# Patient Record
Sex: Female | Born: 1945
Health system: Southern US, Community
[De-identification: ages and names within clinical notes are randomized; demographics above are authoritative.]

## PROBLEM LIST (undated history)

## (undated) DIAGNOSIS — K31811 Angiodysplasia of stomach and duodenum with bleeding: Secondary | ICD-10-CM

## (undated) DIAGNOSIS — IMO0001 Reserved for inherently not codable concepts without codable children: Secondary | ICD-10-CM

## (undated) DIAGNOSIS — K635 Polyp of colon: Secondary | ICD-10-CM

## (undated) DIAGNOSIS — I10 Essential (primary) hypertension: Secondary | ICD-10-CM

## (undated) DIAGNOSIS — K8020 Calculus of gallbladder without cholecystitis without obstruction: Secondary | ICD-10-CM

## (undated) DIAGNOSIS — IMO0002 Reserved for concepts with insufficient information to code with codable children: Secondary | ICD-10-CM

## (undated) DIAGNOSIS — D649 Anemia, unspecified: Secondary | ICD-10-CM

## (undated) DIAGNOSIS — N200 Calculus of kidney: Secondary | ICD-10-CM

## (undated) DIAGNOSIS — K31819 Angiodysplasia of stomach and duodenum without bleeding: Secondary | ICD-10-CM

## (undated) DIAGNOSIS — A0472 Enterocolitis due to Clostridium difficile, not specified as recurrent: Secondary | ICD-10-CM

## (undated) DIAGNOSIS — C50412 Malignant neoplasm of upper-outer quadrant of left female breast: Secondary | ICD-10-CM

## (undated) DIAGNOSIS — R232 Flushing: Secondary | ICD-10-CM

## (undated) DIAGNOSIS — Z973 Presence of spectacles and contact lenses: Secondary | ICD-10-CM

## (undated) DIAGNOSIS — H269 Unspecified cataract: Secondary | ICD-10-CM

## (undated) HISTORY — DX: Unspecified cataract: H26.9

## (undated) HISTORY — DX: Flushing: R23.2

## (undated) HISTORY — DX: Polyp of colon: K63.5

## (undated) HISTORY — DX: Enterocolitis due to Clostridium difficile, not specified as recurrent: A04.72

## (undated) HISTORY — DX: Malignant neoplasm of upper-outer quadrant of left female breast: C50.412

## (undated) HISTORY — DX: Reserved for concepts with insufficient information to code with codable children: IMO0002

## (undated) HISTORY — DX: Essential (primary) hypertension: I10

## (undated) HISTORY — PX: CATARACT EXTRACTION, BILATERAL: SHX1313

## (undated) HISTORY — PX: WISDOM TOOTH EXTRACTION: SHX21

## (undated) HISTORY — DX: Angiodysplasia of stomach and duodenum without bleeding: K31.819

## (undated) HISTORY — DX: Angiodysplasia of stomach and duodenum with bleeding: K31.811

## (undated) HISTORY — DX: Calculus of gallbladder without cholecystitis without obstruction: K80.20

## (undated) HISTORY — PX: TONSILLECTOMY: SUR1361

## (undated) HISTORY — DX: Reserved for inherently not codable concepts without codable children: IMO0001

## (undated) HISTORY — DX: Anemia, unspecified: D64.9

## (undated) HISTORY — PX: TUBAL LIGATION: SHX77

## (undated) HISTORY — PX: CHOLECYSTECTOMY: SHX55

## (undated) HISTORY — PX: COLONOSCOPY: SHX174

---

## 1978-05-20 HISTORY — PX: PARTIAL HYSTERECTOMY: SHX80

## 1999-02-21 ENCOUNTER — Other Ambulatory Visit: Admission: RE | Admit: 1999-02-21 | Discharge: 1999-02-21 | Payer: Self-pay | Admitting: Obstetrics and Gynecology

## 2000-02-27 ENCOUNTER — Other Ambulatory Visit: Admission: RE | Admit: 2000-02-27 | Discharge: 2000-02-27 | Payer: Self-pay | Admitting: Obstetrics and Gynecology

## 2001-03-10 ENCOUNTER — Other Ambulatory Visit: Admission: RE | Admit: 2001-03-10 | Discharge: 2001-03-10 | Payer: Self-pay | Admitting: Obstetrics and Gynecology

## 2002-04-06 ENCOUNTER — Other Ambulatory Visit: Admission: RE | Admit: 2002-04-06 | Discharge: 2002-04-06 | Payer: Self-pay | Admitting: Obstetrics and Gynecology

## 2003-08-19 DIAGNOSIS — N200 Calculus of kidney: Secondary | ICD-10-CM | POA: Insufficient documentation

## 2003-08-21 ENCOUNTER — Emergency Department (HOSPITAL_COMMUNITY): Admission: EM | Admit: 2003-08-21 | Discharge: 2003-08-22 | Payer: Self-pay

## 2003-08-23 ENCOUNTER — Encounter: Admission: RE | Admit: 2003-08-23 | Discharge: 2003-08-23 | Payer: Self-pay | Admitting: Family Medicine

## 2003-08-23 DIAGNOSIS — K8020 Calculus of gallbladder without cholecystitis without obstruction: Secondary | ICD-10-CM

## 2003-08-23 HISTORY — DX: Calculus of gallbladder without cholecystitis without obstruction: K80.20

## 2003-10-19 DIAGNOSIS — K8020 Calculus of gallbladder without cholecystitis without obstruction: Secondary | ICD-10-CM | POA: Insufficient documentation

## 2003-11-11 ENCOUNTER — Encounter (INDEPENDENT_AMBULATORY_CARE_PROVIDER_SITE_OTHER): Payer: Self-pay | Admitting: Specialist

## 2003-11-11 ENCOUNTER — Observation Stay (HOSPITAL_COMMUNITY): Admission: RE | Admit: 2003-11-11 | Discharge: 2003-11-12 | Payer: Self-pay | Admitting: Surgery

## 2006-06-18 ENCOUNTER — Ambulatory Visit: Payer: Self-pay | Admitting: Family Medicine

## 2006-06-25 ENCOUNTER — Ambulatory Visit: Payer: Self-pay | Admitting: Family Medicine

## 2006-06-25 LAB — CONVERTED CEMR LAB
ALT: 13 units/L (ref 0–40)
Alkaline Phosphatase: 65 units/L (ref 39–117)
BUN: 14 mg/dL (ref 6–23)
Basophils Relative: 0.1 % (ref 0.0–1.0)
CO2: 33 meq/L — ABNORMAL HIGH (ref 19–32)
Calcium: 9.5 mg/dL (ref 8.4–10.5)
Creatinine, Ser: 0.8 mg/dL (ref 0.4–1.2)
Eosinophils Relative: 4 % (ref 0.0–5.0)
Hemoglobin: 11.8 g/dL — ABNORMAL LOW (ref 12.0–15.0)
Monocytes Absolute: 0.5 10*3/uL (ref 0.2–0.7)
Monocytes Relative: 13.1 % — ABNORMAL HIGH (ref 3.0–11.0)
Potassium: 4.1 meq/L (ref 3.5–5.1)
RDW: 12.8 % (ref 11.5–14.6)
Total Bilirubin: 0.9 mg/dL (ref 0.3–1.2)
Total Protein: 7.2 g/dL (ref 6.0–8.3)
WBC: 4.1 10*3/uL — ABNORMAL LOW (ref 4.5–10.5)

## 2006-07-16 ENCOUNTER — Ambulatory Visit: Payer: Self-pay | Admitting: Internal Medicine

## 2006-08-18 ENCOUNTER — Ambulatory Visit: Payer: Self-pay | Admitting: Family Medicine

## 2007-01-14 ENCOUNTER — Ambulatory Visit: Payer: Self-pay | Admitting: Family Medicine

## 2007-01-14 DIAGNOSIS — D649 Anemia, unspecified: Secondary | ICD-10-CM | POA: Insufficient documentation

## 2007-01-20 LAB — CONVERTED CEMR LAB
Eosinophils Absolute: 0.2 10*3/uL (ref 0.0–0.6)
Eosinophils Relative: 4 % (ref 0.0–5.0)
HCT: 34 % — ABNORMAL LOW (ref 36.0–46.0)
Lymphocytes Relative: 27.7 % (ref 12.0–46.0)
MCV: 88.8 fL (ref 78.0–100.0)
Monocytes Absolute: 0.5 10*3/uL (ref 0.2–0.7)
Neutro Abs: 2.5 10*3/uL (ref 1.4–7.7)
Neutrophils Relative %: 56.6 % (ref 43.0–77.0)
Platelets: 247 10*3/uL (ref 150–400)
WBC: 4.4 10*3/uL — ABNORMAL LOW (ref 4.5–10.5)

## 2007-11-03 ENCOUNTER — Ambulatory Visit: Payer: Self-pay | Admitting: Family Medicine

## 2007-12-06 ENCOUNTER — Encounter (INDEPENDENT_AMBULATORY_CARE_PROVIDER_SITE_OTHER): Payer: Self-pay | Admitting: Internal Medicine

## 2008-06-22 ENCOUNTER — Ambulatory Visit: Payer: Self-pay | Admitting: Family Medicine

## 2008-06-22 DIAGNOSIS — E559 Vitamin D deficiency, unspecified: Secondary | ICD-10-CM | POA: Insufficient documentation

## 2008-06-23 LAB — CONVERTED CEMR LAB
ALT: 14 units/L (ref 0–35)
Albumin: 3.9 g/dL (ref 3.5–5.2)
Alkaline Phosphatase: 61 units/L (ref 39–117)
BUN: 16 mg/dL (ref 6–23)
Basophils Relative: 0.1 % (ref 0.0–3.0)
Bilirubin, Direct: 0.1 mg/dL (ref 0.0–0.3)
CO2: 33 meq/L — ABNORMAL HIGH (ref 19–32)
Calcium: 9.6 mg/dL (ref 8.4–10.5)
Chloride: 107 meq/L (ref 96–112)
Eosinophils Relative: 5 % (ref 0.0–5.0)
GFR calc Af Amer: 109 mL/min
Glucose, Bld: 91 mg/dL (ref 70–99)
HDL: 39.2 mg/dL (ref >39.0)
MCV: 91 fL (ref 78.0–100.0)
Monocytes Relative: 11.4 % (ref 3.0–12.0)
Neutrophils Relative %: 57.1 % (ref 43.0–77.0)
Platelets: 226 10*3/uL (ref 150–400)
Potassium: 4.3 meq/L (ref 3.5–5.1)
RBC: 3.8 M/uL — ABNORMAL LOW (ref 3.87–5.11)
Total CHOL/HDL Ratio: 5.1
Total Protein: 7.4 g/dL (ref 6.0–8.3)
VLDL: 17 mg/dL (ref 0–40)
WBC: 5 10*3/uL (ref 4.5–10.5)

## 2008-07-11 ENCOUNTER — Ambulatory Visit: Payer: Self-pay | Admitting: Family Medicine

## 2008-07-11 LAB — CONVERTED CEMR LAB: OCCULT 2: NEGATIVE

## 2008-07-22 ENCOUNTER — Encounter (INDEPENDENT_AMBULATORY_CARE_PROVIDER_SITE_OTHER): Payer: Self-pay | Admitting: *Deleted

## 2009-12-21 ENCOUNTER — Encounter (INDEPENDENT_AMBULATORY_CARE_PROVIDER_SITE_OTHER): Payer: Self-pay | Admitting: *Deleted

## 2010-06-10 ENCOUNTER — Encounter: Payer: Self-pay | Admitting: Urology

## 2010-06-19 NOTE — Letter (Signed)
Summary: Kristi Carr letter  [place] [facility]  [address] [place], [place] [address]   Phone: [phone]  Fax: [phone]       12/21/2009 MRN: [id number]  Kristi Carr [address] [place], [place]  [address]  Dear Ms. [name] [place], and  announce the retirement of Arta Silence, M.D., from full-time practice at the [facility] office effective November 16, 2009 and his plans of returning part-time.  It is important to Dr. Hetty Ely and to our practice that you understand that [facility] has seven physicians in our office for your health care needs.  We will continue to offer the same exceptional care that you have today.    Dr. Hetty Ely has spoken to many of you about his plans for retirement and returning part-time in the fall.   We will continue to work with you through the transition to schedule appointments for you in the office and meet the high standards that [facility] is committed to.   Again, it is with great pleasure that we share the news that Dr. Hetty Ely will return to [facility] in October of 2011 with a reduced schedule.    If you have any questions, or would like to request an appointment with one of our physicians, please call us at [phone] and press the option for Scheduling an appointment.  We take pleasure in providing you with excellent patient care and look forward to seeing you at your next office visit.  Our [facility] Physicians are:  Tillman Abide, M.D. Laurita Quint, M.D. Roxy Manns, M.D. Kerby Nora, M.D. Hannah Beat, M.D. Ruthe Mannan, M.D. We proudly welcomed Raechel Ache, M.D. and Eustaquio Boyden, M.D. to the practice in July/August 2011.  Sincerely,  [facility]

## 2010-10-05 NOTE — Op Note (Signed)
[name], [name]                            ACCOUNT NO.:  [id number]   MEDICAL RECORD NO.:  [id number]                   PATIENT TYPE:  OBV   LOCATION:  0484                                 FACILITY:  [facility]   PHYSICIAN:  Thornton Park. Daphine Deutscher, M.D.             DATE OF BIRTH:  1946-03-06   DATE OF PROCEDURE:  11/11/2003  DATE OF DISCHARGE:                                 OPERATIVE REPORT   PREOPERATIVE DIAGNOSES:  Gallstones and history of gallbladder attacks.   POSTOPERATIVE DIAGNOSES:  Chronic cholecystitis.   SURGEON:  Thornton Park. Daphine Deutscher, M.D.   ASSISTANT:  Leonie Man, M.D.   PROCEDURE:  Laparoscopic cholecystectomy with intraoperative cholangiogram.   DESCRIPTION OF PROCEDURE:  [name] is a 65 year old white female who  had some attacks beginning earlier this year. She was seen in the office and  informed consent was obtained regarding laparoscopic as well as open  cholecystectomy.  She was taken to room 11 on November 11, 2003 and given  general anesthesia.  The abdomen was prepped with Betadine and draped  sterilely.  I excised her old transverse scar and umbilicus and entered the  abdomen through a transverse incision around which I placed a pursestring  suture. I entered the abdomen with a trocar easily and insufflated the  abdomen. Three trocars were placed in the upper abdomen using the new  applied medical devices including a 10 mm in the upper midline and two 5 mm  laterally.  The gallbladder was grasped, elevated, and I dissected Calot's  triangle to achieve the critical view of the cystic duct and cystic artery  and the location of the common bile duct.  I put a clip up on the  gallbladder and I also put some clips on the artery and a branch and then I  incised the cystic duct and then put in the Reddick catheter and did a  dynamic cholangiogram which showed good filling of a fairly plump proper  hepatic duct, nice filling of the common bile duct and with  intrahepatic  filling of both right and left branches with free flow into the duodenum.  The cystic duct was then triply clipped, divided, cystic artery was triple  clipped and controlled and then the gallbladder was removed from the  gallbladder bed without entering it using hook electrocautery.  Bleeding was  controlled and hemostasis achieved during the dissection.  No bile leaks  were noted and then the gallbladder was detached. There was no bleeding or  bile leaks noted from the gallbladder bed. The gallbladder was easily  retrieved through the umbilicus.  The umbilical port was then tied down and  this was then checked with the scope in place at which time I looked at both  the gallbladder bed and also back at the anterior abdominal wall.  No  bleeding was noted, no bile leaks were seen.  Trocar sites  were injected with 0.5% Marcaine and the abdomen was deflated  and the wounds were closed with 4-0 Vicryl subcutaneously with Benzoin and  Steri-Strips.  The patient seemed to tolerate the procedure well and was  taken to the recovery room in satisfactory condition.                                               Thornton Park Daphine Deutscher, M.D.    MBM/MEDQ  D:  11/11/2003  T:  11/11/2003  Job:  [id number]   cc:   Laurita Quint, M.D.  [address] [place]  [place] [address]  Fax: [phone]

## 2010-12-04 ENCOUNTER — Encounter: Payer: Self-pay | Admitting: Family Medicine

## 2010-12-05 ENCOUNTER — Encounter: Payer: Self-pay | Admitting: Family Medicine

## 2010-12-05 ENCOUNTER — Ambulatory Visit (INDEPENDENT_AMBULATORY_CARE_PROVIDER_SITE_OTHER): Payer: BC Managed Care – PPO | Admitting: Family Medicine

## 2010-12-05 DIAGNOSIS — H6120 Impacted cerumen, unspecified ear: Secondary | ICD-10-CM

## 2010-12-05 DIAGNOSIS — Z23 Encounter for immunization: Secondary | ICD-10-CM

## 2010-12-05 DIAGNOSIS — R030 Elevated blood-pressure reading, without diagnosis of hypertension: Secondary | ICD-10-CM

## 2010-12-05 DIAGNOSIS — IMO0001 Reserved for inherently not codable concepts without codable children: Secondary | ICD-10-CM

## 2010-12-05 NOTE — Patient Instructions (Addendum)
RTC 2 weeks for BP check. Have BP taken at least three times at [employer] with her machine at the same time. Bring machine here for recheck. Avoid salt as much as possible. Tdap today.

## 2010-12-05 NOTE — Progress Notes (Signed)
  Subjective:    Patient ID: Kristi Carr, female    DOB: July 07, 1945, 65 y.o.   MRN: [id number]  HPI Pt of Billie as well as pt of mine who hasn't been here for a while who comes in today for ear problems. She can't hear from them altho they have inmproved to where she can hear now. No pain, no fever or chills or congestion.  There is significant stress in the family currently. She feels well otherwise. She has a BP machine at home and routinely gets her BP in the 120s/70s.  She has seen a Gyn in [place] this year for Pap, which was nml. She had a mammo this year.   Review of Systems  Constitutional: Negative for fever, chills, diaphoresis, activity change and fatigue.  HENT: Negative for ear pain, congestion, rhinorrhea and postnasal drip.   Eyes: Negative for redness.  Respiratory: Negative for cough, chest tightness, shortness of breath and wheezing.   Cardiovascular: Negative for chest pain.       Objective:   Physical Exam  Constitutional: She appears well-developed and well-nourished. No distress.  HENT:  Head: Normocephalic and atraumatic.  Right Ear: External ear normal.  Left Ear: External ear normal.  Nose: Nose normal.  Mouth/Throat: Oropharynx is clear and moist. No oropharyngeal exudate.       Cerumen bilaterally occluding the canals. Nml beyond after irrigation.  Eyes: Conjunctivae and EOM are normal. Pupils are equal, round, and reactive to light.  Neck: Normal range of motion. Neck supple. No thyromegaly present.  Cardiovascular: Normal rate, regular rhythm and normal heart sounds.   Pulmonary/Chest: Effort normal and breath sounds normal. She has no wheezes. She has no rales.  Lymphadenopathy:    She has no cervical adenopathy.  Skin: She is not diaphoretic.          Assessment & Plan:

## 2010-12-05 NOTE — Assessment & Plan Note (Signed)
See instructions

## 2010-12-05 NOTE — Assessment & Plan Note (Signed)
Bilaterally, irrigated clear.

## 2010-12-19 ENCOUNTER — Ambulatory Visit (INDEPENDENT_AMBULATORY_CARE_PROVIDER_SITE_OTHER): Payer: BC Managed Care – PPO | Admitting: Family Medicine

## 2010-12-19 ENCOUNTER — Encounter: Payer: Self-pay | Admitting: Family Medicine

## 2010-12-19 DIAGNOSIS — I10 Essential (primary) hypertension: Secondary | ICD-10-CM

## 2010-12-19 MED ORDER — METOPROLOL SUCCINATE ER 25 MG PO TB24: 25.0000 mg | ORAL_TABLET | Freq: Every day | ORAL | Status: DC

## 2010-12-19 NOTE — Assessment & Plan Note (Signed)
Will start pt on Metoprolol at 25 mg and reassess in 2 mos. Take medication at night.

## 2010-12-19 NOTE — Patient Instructions (Signed)
RTC 2 mos for BP check.

## 2010-12-19 NOTE — Progress Notes (Signed)
  Subjective:    Patient ID: Kristi Carr, female    DOB: [birth year], 65 y.o.   MRN: [id number]  HPI Pt here for recheck of her BP. She monitors at home and it had been good as of last visit two weeks ago. Her father died at 28 of MI, never saw a doctor. Mother died of cancer. Brother has very high BP. He now has heart problems.  She feels well and had no complaints. She is very active and has lots going on right now.    Review of SystemsNoncontributory except as above.       Objective:   Physical Exam  Cardiovascular: Normal rate, regular rhythm, normal heart sounds and intact distal pulses.   No murmur heard. Pulmonary/Chest: Effort normal and breath sounds normal. She has no wheezes. She has no rales.          Assessment & Plan:

## 2011-02-20 ENCOUNTER — Other Ambulatory Visit: Payer: Self-pay | Admitting: Family Medicine

## 2011-02-20 DIAGNOSIS — D649 Anemia, unspecified: Secondary | ICD-10-CM

## 2011-02-20 DIAGNOSIS — E7800 Pure hypercholesterolemia, unspecified: Secondary | ICD-10-CM

## 2011-02-20 DIAGNOSIS — I10 Essential (primary) hypertension: Secondary | ICD-10-CM

## 2011-02-20 DIAGNOSIS — E559 Vitamin D deficiency, unspecified: Secondary | ICD-10-CM

## 2011-02-27 ENCOUNTER — Ambulatory Visit (INDEPENDENT_AMBULATORY_CARE_PROVIDER_SITE_OTHER): Payer: BC Managed Care – PPO | Admitting: Family Medicine

## 2011-02-27 ENCOUNTER — Encounter: Payer: Self-pay | Admitting: Family Medicine

## 2011-02-27 ENCOUNTER — Other Ambulatory Visit (INDEPENDENT_AMBULATORY_CARE_PROVIDER_SITE_OTHER): Payer: BC Managed Care – PPO

## 2011-02-27 VITALS — BP 150/82 | HR 64 | Temp 97.5°F | Ht 65.0 in | Wt 137.5 lb

## 2011-02-27 DIAGNOSIS — E7800 Pure hypercholesterolemia, unspecified: Secondary | ICD-10-CM

## 2011-02-27 DIAGNOSIS — E559 Vitamin D deficiency, unspecified: Secondary | ICD-10-CM

## 2011-02-27 DIAGNOSIS — I10 Essential (primary) hypertension: Secondary | ICD-10-CM

## 2011-02-27 DIAGNOSIS — D649 Anemia, unspecified: Secondary | ICD-10-CM

## 2011-02-27 LAB — BASIC METABOLIC PANEL
Calcium: 9.4 mg/dL (ref 8.4–10.5)
GFR: 81.33 mL/min (ref >60.00)
Glucose, Bld: 89 mg/dL (ref 70–99)
Sodium: 143 mEq/L (ref 135–145)

## 2011-02-27 LAB — HEPATIC FUNCTION PANEL
ALT: 13 U/L (ref 0–35)
Total Bilirubin: 0.6 mg/dL (ref 0.3–1.2)

## 2011-02-27 LAB — CBC WITH DIFFERENTIAL/PLATELET
Basophils Relative: 0.2 % (ref 0.0–3.0)
Eosinophils Relative: 3.4 % (ref 0.0–5.0)
HCT: 36.3 % (ref 36.0–46.0)
MCV: 91 fl (ref 78.0–100.0)
Monocytes Absolute: 0.5 10*3/uL (ref 0.1–1.0)
Monocytes Relative: 10 % (ref 3.0–12.0)
Neutrophils Relative %: 58.1 % (ref 43.0–77.0)
Platelets: 218 10*3/uL (ref 150.0–400.0)
RBC: 3.99 Mil/uL (ref 3.87–5.11)

## 2011-02-27 LAB — LIPID PANEL: VLDL: 18.4 mg/dL (ref 0.0–40.0)

## 2011-02-27 NOTE — Progress Notes (Signed)
  Subjective:    Patient ID: Kristi Carr, female    DOB: [birth year], 65 y.o.   MRN: [id number]  HPIPt here for 2 month follow up after not being seen for quite some time. Her BP was quite high and we started her on Metoprolol 25mg  at night. She is tolerating her medication ok. Her BP was great at home after starting the medication so she quit taking her BP. She is surprised to find it high today. She tried taking Co Q 10 but couldn't tolerate it due to heart feeling funny. She had stopped it and then restarted with the same results. She has a PE scheduled here in two weeks. She has a Gyn and is UTD with Pap, breast exam, mammo, colonoscopy and DEXA.  She feels well today with no complaints. She understands the importance of BP control as her brother has had a stroke and is having a rough time with debility.    Review of Systems  Constitutional: Negative for fever, chills, diaphoresis, activity change and fatigue.  HENT: Negative for ear pain, congestion, rhinorrhea and postnasal drip.   Eyes: Negative for redness.  Respiratory: Negative for cough, chest tightness, shortness of breath and wheezing.   Cardiovascular: Negative for chest pain.       Objective:   Physical Exam  Constitutional: She appears well-developed and well-nourished. No distress.  HENT:  Head: Normocephalic and atraumatic.  Right Ear: External ear normal.  Left Ear: External ear normal.  Nose: Nose normal.  Mouth/Throat: Oropharynx is clear and moist. No oropharyngeal exudate.  Eyes: Conjunctivae and EOM are normal. Pupils are equal, round, and reactive to light.  Neck: Normal range of motion. Neck supple. No thyromegaly present.  Cardiovascular: Normal rate, regular rhythm and normal heart sounds.   Pulmonary/Chest: Effort normal and breath sounds normal. She has no wheezes. She has no rales.  Lymphadenopathy:    She has no cervical adenopathy.  Skin: She is not diaphoretic.          Assessment & Plan:

## 2011-02-27 NOTE — Patient Instructions (Signed)
RTC as scheduled in 2 weeks. Had labs drawn this AM.

## 2011-02-27 NOTE — Assessment & Plan Note (Signed)
Improved but still elevasted. Increase to 50mg  by taking 2-25mg  tabs at night. Recheck at PE in 2 weeks.  BP Readings from Last 3 Encounters:  02/27/11 150/82  12/19/10 180/94  12/05/10 150/100

## 2011-03-14 ENCOUNTER — Encounter: Payer: Self-pay | Admitting: Family Medicine

## 2011-03-14 ENCOUNTER — Ambulatory Visit (INDEPENDENT_AMBULATORY_CARE_PROVIDER_SITE_OTHER): Payer: BC Managed Care – PPO | Admitting: Family Medicine

## 2011-03-14 DIAGNOSIS — H6120 Impacted cerumen, unspecified ear: Secondary | ICD-10-CM

## 2011-03-14 DIAGNOSIS — I10 Essential (primary) hypertension: Secondary | ICD-10-CM

## 2011-03-14 DIAGNOSIS — N200 Calculus of kidney: Secondary | ICD-10-CM

## 2011-03-14 DIAGNOSIS — D649 Anemia, unspecified: Secondary | ICD-10-CM

## 2011-03-14 DIAGNOSIS — E559 Vitamin D deficiency, unspecified: Secondary | ICD-10-CM

## 2011-03-14 DIAGNOSIS — E7800 Pure hypercholesterolemia, unspecified: Secondary | ICD-10-CM

## 2011-03-14 NOTE — Assessment & Plan Note (Signed)
None since having her gallstone.

## 2011-03-14 NOTE — Assessment & Plan Note (Signed)
Adequate without medication. Due to FH, would try to get LDL lower. Lab Results  Component Value Date   CHOL 191 02/27/2011   HDL 48.00 02/27/2011   LDLCALC 125* 02/27/2011   TRIG 92.0 02/27/2011   CHOLHDL 4 02/27/2011

## 2011-03-14 NOTE — Progress Notes (Signed)
  Subjective:    Patient ID: Kristi Carr, female    DOB: 16-Feb-1946, 65 y.o.   MRN: [id number]  HPI Pt here for Comp Exam. She was started on BP meds last time with increase to 25mg x2 of Metoprolol for BP 180/94. At home her BP has been 120/60-70 and she takes it regularly.  She has no other complaints.   Review of Systems  Constitutional: Negative for fever, chills, diaphoresis, activity change, appetite change, fatigue and unexpected weight change.  HENT: Negative for hearing loss, ear pain, nosebleeds, rhinorrhea, trouble swallowing, tinnitus and ear discharge.   Eyes: Negative for pain, discharge, redness and visual disturbance.  Respiratory: Negative for cough, chest tightness, shortness of breath and wheezing.   Cardiovascular: Negative for chest pain, palpitations and leg swelling.  Gastrointestinal: Negative for nausea, vomiting, abdominal pain, diarrhea, constipation, blood in stool and abdominal distention.  Genitourinary: Negative for dysuria and frequency.       Nocturia once a night.  Musculoskeletal: Negative for myalgias, back pain and arthralgias.  Skin: Negative for rash.  Neurological: Negative for dizziness, tremors, syncope and numbness.  Hematological: Negative for adenopathy. Does not bruise/bleed easily.  Psychiatric/Behavioral: Negative for hallucinations and agitation. The patient is not nervous/anxious.        Objective:   Physical Exam  Constitutional: She is oriented to person, place, and time. She appears well-developed and well-nourished. No distress.  HENT:  Head: Normocephalic and atraumatic.  Left Ear: External ear normal.  Nose: Nose normal.  Mouth/Throat: Oropharynx is clear and moist. No oropharyngeal exudate.  Eyes: Conjunctivae and EOM are normal. Pupils are equal, round, and reactive to light. No scleral icterus.  Neck: Normal range of motion. Neck supple. No thyromegaly present.  Cardiovascular: Normal rate, regular rhythm and normal heart  sounds.  Exam reveals no friction rub.   No murmur heard. Pulmonary/Chest: Effort normal and breath sounds normal. No respiratory distress. She has no wheezes. She has no rales.  Abdominal: Soft. Bowel sounds are normal. She exhibits no mass. There is no tenderness.  Musculoskeletal: Normal range of motion. She exhibits no edema and no tenderness.  Lymphadenopathy:    She has no cervical adenopathy.  Neurological: She is alert and oriented to person, place, and time. She has normal reflexes.  Skin: Skin is warm and dry. No rash noted. She is not diaphoretic. No erythema.  Psychiatric: She has a normal mood and affect. Her behavior is normal. Judgment and thought content normal.          Assessment & Plan:  HMPE

## 2011-03-14 NOTE — Assessment & Plan Note (Signed)
Clear today. Discussed regular irrigation.

## 2011-03-14 NOTE — Assessment & Plan Note (Signed)
Improved on increased Metoprolol but still need better coverage. She does not really want to increase medication or start an additional med. Her BP at home is real good.  Will have her decrease to taking Metoprolol 25mg  twice a day. Recheck in 4 months.

## 2011-03-14 NOTE — Assessment & Plan Note (Signed)
Adequate replacement.

## 2011-03-14 NOTE — Patient Instructions (Signed)
RTC 4/13 for BP check with Dr Para March.

## 2011-03-14 NOTE — Assessment & Plan Note (Signed)
Great level on current replacement.

## 2011-04-03 ENCOUNTER — Other Ambulatory Visit: Payer: Self-pay | Admitting: *Deleted

## 2011-04-03 MED ORDER — METOPROLOL SUCCINATE ER 25 MG PO TB24: ORAL_TABLET | ORAL | Status: DC

## 2011-04-03 NOTE — Telephone Encounter (Signed)
Left message on machine for patient to call back.

## 2011-04-03 NOTE — Telephone Encounter (Signed)
Pt states that her metoprolol dose was increased to twice a day at her last visit.  Is this the extended release?  She needs new script sent to [address].

## 2011-04-04 NOTE — Telephone Encounter (Signed)
Left message on machine to call back  

## 2011-04-15 NOTE — Telephone Encounter (Signed)
Spoke to patient and was advised that this has been taken care of and that she has already picked her medication up.

## 2011-09-13 ENCOUNTER — Ambulatory Visit (INDEPENDENT_AMBULATORY_CARE_PROVIDER_SITE_OTHER): Payer: BC Managed Care – PPO | Admitting: Family Medicine

## 2011-09-13 ENCOUNTER — Encounter: Payer: Self-pay | Admitting: Family Medicine

## 2011-09-13 VITALS — BP 150/86 | HR 63 | Temp 98.3°F | Wt 142.0 lb

## 2011-09-13 DIAGNOSIS — I10 Essential (primary) hypertension: Secondary | ICD-10-CM

## 2011-09-13 NOTE — Assessment & Plan Note (Signed)
Improved, controlled at home.  Continue as is.  Continue work on diet and exercise.  She agrees.  Fu for CPE fall 2013.

## 2011-09-13 NOTE — Progress Notes (Signed)
Hypertension:    Using medication without problems or lightheadedness: yes Chest pain with exertion: no Edema:no Short of breath:no Average home BPs: BP has been 120-130/70-80 consistently on home checks.   Exercise: gardening, yard work, grandkids.   Meds, vitals, and allergies reviewed.   PMH and SH reviewed  ROS: See HPI.  Otherwise negative.    GEN: nad, alert and oriented HEENT: mucous membranes moist NECK: supple w/o LA CV: rrr. PULM: ctab, no inc wob ABD: soft, +bs EXT: no edema SKIN: no acute rash

## 2011-09-13 NOTE — Patient Instructions (Signed)
Take care.  Don't change your meds.  Glad to see you.  I would get a physical in October or November.

## 2012-04-18 ENCOUNTER — Other Ambulatory Visit: Payer: Self-pay | Admitting: Family Medicine

## 2012-08-31 ENCOUNTER — Other Ambulatory Visit: Payer: Self-pay | Admitting: Family Medicine

## 2012-08-31 NOTE — Telephone Encounter (Signed)
Schedule a CPE.  rx sent.  Thanks.

## 2012-08-31 NOTE — Telephone Encounter (Signed)
Electronic refill request.  Pt has not been seen in > 1 year, no upcoming appts.  Please advise.

## 2012-09-01 NOTE — Telephone Encounter (Signed)
Patient advised.  Will call in to schedule CPE.

## 2012-10-20 ENCOUNTER — Other Ambulatory Visit: Payer: Self-pay | Admitting: Family Medicine

## 2012-10-20 DIAGNOSIS — I10 Essential (primary) hypertension: Secondary | ICD-10-CM

## 2012-10-20 DIAGNOSIS — E559 Vitamin D deficiency, unspecified: Secondary | ICD-10-CM

## 2012-10-20 DIAGNOSIS — D649 Anemia, unspecified: Secondary | ICD-10-CM

## 2012-10-27 ENCOUNTER — Other Ambulatory Visit (INDEPENDENT_AMBULATORY_CARE_PROVIDER_SITE_OTHER): Payer: Medicare Other

## 2012-10-27 DIAGNOSIS — D649 Anemia, unspecified: Secondary | ICD-10-CM

## 2012-10-27 DIAGNOSIS — I10 Essential (primary) hypertension: Secondary | ICD-10-CM

## 2012-10-27 DIAGNOSIS — E7800 Pure hypercholesterolemia, unspecified: Secondary | ICD-10-CM

## 2012-10-27 DIAGNOSIS — E559 Vitamin D deficiency, unspecified: Secondary | ICD-10-CM

## 2012-10-27 LAB — COMPREHENSIVE METABOLIC PANEL
Albumin: 4.1 g/dL (ref 3.5–5.2)
BUN: 16 mg/dL (ref 6–23)
Calcium: 9.5 mg/dL (ref 8.4–10.5)
Chloride: 106 mEq/L (ref 96–112)
Glucose, Bld: 91 mg/dL (ref 70–99)
Potassium: 4.5 mEq/L (ref 3.5–5.1)
Sodium: 144 mEq/L (ref 135–145)
Total Protein: 8 g/dL (ref 6.0–8.3)

## 2012-10-27 LAB — CBC WITH DIFFERENTIAL/PLATELET
Basophils Relative: 0.3 % (ref 0.0–3.0)
Eosinophils Relative: 5.9 % — ABNORMAL HIGH (ref 0.0–5.0)
Lymphs Abs: 1.5 10*3/uL (ref 0.7–4.0)
MCV: 90.3 fl (ref 78.0–100.0)
Neutro Abs: 1.8 10*3/uL (ref 1.4–7.7)
Neutrophils Relative %: 44.7 % (ref 43.0–77.0)
Platelets: 262 10*3/uL (ref 150.0–400.0)
RDW: 13.9 % (ref 11.5–14.6)
WBC: 4.1 10*3/uL — ABNORMAL LOW (ref 4.5–10.5)

## 2012-10-27 LAB — LIPID PANEL
LDL Cholesterol: 142 mg/dL — ABNORMAL HIGH (ref 0–99)
Total CHOL/HDL Ratio: 5
Triglycerides: 82 mg/dL (ref 0.0–149.0)

## 2012-10-28 LAB — VITAMIN D 25 HYDROXY (VIT D DEFICIENCY, FRACTURES): Vit D, 25-Hydroxy: 41 ng/mL (ref 30–89)

## 2012-11-02 ENCOUNTER — Ambulatory Visit (INDEPENDENT_AMBULATORY_CARE_PROVIDER_SITE_OTHER): Payer: Medicare Other | Admitting: Family Medicine

## 2012-11-02 ENCOUNTER — Encounter: Payer: Self-pay | Admitting: Family Medicine

## 2012-11-02 VITALS — BP 142/84 | HR 64 | Temp 97.9°F | Ht 65.0 in | Wt 143.8 lb

## 2012-11-02 DIAGNOSIS — Z23 Encounter for immunization: Secondary | ICD-10-CM

## 2012-11-02 DIAGNOSIS — R197 Diarrhea, unspecified: Secondary | ICD-10-CM

## 2012-11-02 DIAGNOSIS — Z0000 Encounter for general adult medical examination without abnormal findings: Secondary | ICD-10-CM

## 2012-11-02 DIAGNOSIS — I10 Essential (primary) hypertension: Secondary | ICD-10-CM

## 2012-11-02 MED ORDER — METOPROLOL SUCCINATE ER 25 MG PO TB24: ORAL_TABLET | ORAL | Status: DC

## 2012-11-02 NOTE — Progress Notes (Signed)
I have personally reviewed the Medicare Annual Wellness questionnaire and have noted 1. The patient's medical and social history 2. Their use of alcohol, tobacco or illicit drugs 3. Their current medications and supplements 4. The patient's functional ability including ADL's, fall risks, home safety risks and hearing or visual             impairment. 5. Diet and physical activities 6. Evidence for depression or mood disorders  The patients weight, height, BMI have been recorded in the chart and visual acuity is per eye clinic.  I have made referrals, counseling and provided education to the patient based review of the above and I have provided the pt with a written personalized care plan for preventive services.  See scanned forms.  Routine anticipatory guidance given to patient.  See health maintenance. Flu encouraged  Shingles 2010 PNA 2014 Tetanus 2012 Colonoscopy d/w pt.  Prev done.  She'll check her old records.  Breast cancer screening done 2014 Advance directive- would have he husband designated if incapacitated.  Has a living will.  Cognitive function addressed- see scanned forms- and if abnormal then additional documentation follows.   Hypertension:  Using medication without problems or lightheadedness: yes Chest pain with exertion:no Edema:no Short of breath:no Average home BPs: 120s systolic at home.   She has had some possibl mild thinning of hair w/o focal loss.  Her beautician didn't notice any sig abnormality.   She continues to have diarrhea/loose stools since cholecystectomy years ago.  It never fully returned to normal.  She asks about options. No blood in stool.   PMH and SH reviewed  Meds, vitals, and allergies reviewed.   ROS: See HPI.  Otherwise negative.    GEN: nad, alert and oriented HEENT: mucous membranes moist NECK: supple w/o LA CV: rrr. PULM: ctab, no inc wob ABD: soft, +bs EXT: no edema SKIN: no acute rash Her hair and scalp appear normal

## 2012-11-02 NOTE — Patient Instructions (Addendum)
Get me a copy of your colonoscopy report.  We can scan a copy of your living will.   Take care.  Don't change your meds.  Glad to see you.

## 2012-11-04 ENCOUNTER — Other Ambulatory Visit: Payer: Self-pay | Admitting: Family Medicine

## 2012-11-04 DIAGNOSIS — Z0000 Encounter for general adult medical examination without abnormal findings: Secondary | ICD-10-CM | POA: Insufficient documentation

## 2012-11-04 DIAGNOSIS — R197 Diarrhea, unspecified: Secondary | ICD-10-CM | POA: Insufficient documentation

## 2012-11-04 NOTE — Assessment & Plan Note (Signed)
See scanned forms.  Routine anticipatory guidance given to patient.  See health maintenance. Flu encouraged  Shingles 2010 PNA 2014 Tetanus 2012 Colonoscopy d/w pt.  Prev done.  She'll check her old records.  Breast cancer screening done 2014 Advance directive- would have he husband designated if incapacitated.  Has a living will.  Cognitive function addressed- see scanned forms- and if abnormal then additional documentation follows.

## 2012-11-04 NOTE — Assessment & Plan Note (Signed)
Will offer a trial of questran.  See following phone note.

## 2012-11-04 NOTE — Telephone Encounter (Signed)
Call pt.  Would be reasonable to try questran/cholestyramine for the stool changes.  Would start with a low dose.  If she wants to start, then please send in and have her update [place].  Thanks.  rx added, but not sent, in meantime.

## 2012-11-04 NOTE — Telephone Encounter (Signed)
.  left message to have patient return my call.  

## 2012-11-04 NOTE — Assessment & Plan Note (Signed)
Controlled on home checks, continue as is.  dw pt.

## 2012-11-06 ENCOUNTER — Other Ambulatory Visit: Payer: Self-pay | Admitting: *Deleted

## 2012-11-06 MED ORDER — CHOLESTYRAMINE 4 GM/DOSE PO POWD: 2.0000 g | Freq: Two times a day (BID) | ORAL | Status: DC | PRN

## 2012-11-06 NOTE — Telephone Encounter (Signed)
Patient advised. Rx sent to pharmacy. 

## 2013-04-13 ENCOUNTER — Encounter: Payer: Self-pay | Admitting: Family Medicine

## 2013-08-02 ENCOUNTER — Other Ambulatory Visit: Payer: Self-pay | Admitting: Radiology

## 2013-08-05 ENCOUNTER — Encounter: Payer: Self-pay | Admitting: *Deleted

## 2013-08-05 ENCOUNTER — Other Ambulatory Visit: Payer: Self-pay | Admitting: *Deleted

## 2013-08-05 DIAGNOSIS — C50919 Malignant neoplasm of unspecified site of unspecified female breast: Secondary | ICD-10-CM

## 2013-08-05 NOTE — [facility] Oncology Navigator Note (Signed)
Confirmed [place] appointment for 08/11/13 at 12:00 PM.  Instructions and contact information given.  Patient verbalized understanding.

## 2013-08-11 ENCOUNTER — Ambulatory Visit (HOSPITAL_BASED_OUTPATIENT_CLINIC_OR_DEPARTMENT_OTHER): Payer: Medicare HMO | Admitting: Oncology

## 2013-08-11 ENCOUNTER — Encounter: Payer: Self-pay | Admitting: Radiation Oncology

## 2013-08-11 ENCOUNTER — Encounter: Payer: Self-pay | Admitting: Oncology

## 2013-08-11 ENCOUNTER — Ambulatory Visit
Admission: RE | Admit: 2013-08-11 | Discharge: 2013-08-11 | Disposition: A | Discharge status: Home / Self Care | Payer: Medicare Other | Source: Ambulatory Visit | Attending: Radiation Oncology | Admitting: Radiation Oncology

## 2013-08-11 ENCOUNTER — Ambulatory Visit (HOSPITAL_BASED_OUTPATIENT_CLINIC_OR_DEPARTMENT_OTHER): Payer: Medicare HMO | Admitting: General Surgery

## 2013-08-11 ENCOUNTER — Other Ambulatory Visit (HOSPITAL_BASED_OUTPATIENT_CLINIC_OR_DEPARTMENT_OTHER): Payer: Medicare HMO

## 2013-08-11 ENCOUNTER — Ambulatory Visit: Payer: Medicare HMO

## 2013-08-11 ENCOUNTER — Encounter (INDEPENDENT_AMBULATORY_CARE_PROVIDER_SITE_OTHER): Payer: Self-pay | Admitting: General Surgery

## 2013-08-11 ENCOUNTER — Ambulatory Visit: Payer: Medicare HMO | Attending: General Surgery | Admitting: Physical Therapy

## 2013-08-11 VITALS — BP 176/76 | HR 64 | Temp 98.4°F | Resp 18 | Ht 65.0 in | Wt 140.8 lb

## 2013-08-11 DIAGNOSIS — C50419 Malignant neoplasm of upper-outer quadrant of unspecified female breast: Secondary | ICD-10-CM

## 2013-08-11 DIAGNOSIS — C50412 Malignant neoplasm of upper-outer quadrant of left female breast: Secondary | ICD-10-CM

## 2013-08-11 DIAGNOSIS — R293 Abnormal posture: Secondary | ICD-10-CM | POA: Insufficient documentation

## 2013-08-11 DIAGNOSIS — M8580 Other specified disorders of bone density and structure, unspecified site: Secondary | ICD-10-CM

## 2013-08-11 DIAGNOSIS — IMO0001 Reserved for inherently not codable concepts without codable children: Secondary | ICD-10-CM | POA: Insufficient documentation

## 2013-08-11 DIAGNOSIS — I10 Essential (primary) hypertension: Secondary | ICD-10-CM

## 2013-08-11 DIAGNOSIS — C50919 Malignant neoplasm of unspecified site of unspecified female breast: Secondary | ICD-10-CM

## 2013-08-11 DIAGNOSIS — E559 Vitamin D deficiency, unspecified: Secondary | ICD-10-CM

## 2013-08-11 DIAGNOSIS — Z170 Estrogen receptor positive status [ER+]: Secondary | ICD-10-CM

## 2013-08-11 DIAGNOSIS — Z853 Personal history of malignant neoplasm of breast: Secondary | ICD-10-CM | POA: Insufficient documentation

## 2013-08-11 LAB — CBC WITH DIFFERENTIAL/PLATELET
BASO%: 0.2 % (ref 0.0–2.0)
Basophils Absolute: 0 10*3/uL (ref 0.0–0.1)
EOS ABS: 0.2 10*3/uL (ref 0.0–0.5)
EOS%: 4.4 % (ref 0.0–7.0)
HEMATOCRIT: 34.5 % — AB (ref 34.8–46.6)
HGB: 11.3 g/dL — ABNORMAL LOW (ref 11.6–15.9)
LYMPH%: 26.8 % (ref 14.0–49.7)
MCH: 29.1 pg (ref 25.1–34.0)
MCHC: 32.8 g/dL (ref 31.5–36.0)
MCV: 88.7 fL (ref 79.5–101.0)
MONO#: 0.6 10*3/uL (ref 0.1–0.9)
MONO%: 12.5 % (ref 0.0–14.0)
NEUT#: 2.5 10*3/uL (ref 1.5–6.5)
NEUT%: 56.1 % (ref 38.4–76.8)
PLATELETS: 246 10*3/uL (ref 145–400)
RBC: 3.89 10*6/uL (ref 3.70–5.45)
RDW: 13.5 % (ref 11.2–14.5)
WBC: 4.4 10*3/uL (ref 3.9–10.3)
lymph#: 1.2 10*3/uL (ref 0.9–3.3)

## 2013-08-11 LAB — COMPREHENSIVE METABOLIC PANEL (CC13)
ALK PHOS: 74 U/L (ref 40–150)
ALT: 11 U/L (ref 0–55)
ANION GAP: 11 meq/L (ref 3–11)
AST: 14 U/L (ref 5–34)
Albumin: 4 g/dL (ref 3.5–5.0)
BILIRUBIN TOTAL: 0.91 mg/dL (ref 0.20–1.20)
BUN: 14.7 mg/dL (ref 7.0–26.0)
CO2: 28 mEq/L (ref 22–29)
CREATININE: 0.8 mg/dL (ref 0.6–1.1)
Calcium: 9.9 mg/dL (ref 8.4–10.4)
Chloride: 105 mEq/L (ref 98–109)
GLUCOSE: 92 mg/dL (ref 70–140)
Potassium: 4.1 mEq/L (ref 3.5–5.1)
SODIUM: 145 meq/L (ref 136–145)
TOTAL PROTEIN: 7.6 g/dL (ref 6.4–8.3)

## 2013-08-11 NOTE — Progress Notes (Signed)
Radiation Oncology         [phone] ________________________________  Initial outpatient Consultation  Name: Kristi Carr MRN: [id number]  Date: 08/11/2013  DOB: 06-17-1945  JO:[id number] Damita Dunnings, MD  Rolm Bookbinder, MD   REFERRING PHYSICIAN: Rolm Bookbinder, MD  DIAGNOSIS: Breast cancer of upper-outer quadrant of left female breast   Primary site: Breast (Left)   Staging method: AJCC 7th Edition   Clinical: Stage IIA (T2, N0, cM0)   Summary: Stage IIA (T2, N0, cM0)   Clinical comments: Staged at breast conference 08/11/13.  HISTORY OF PRESENT ILLNESS::Kristi Carr is a 68 y.o. female who is seen out of the courtesy of Dr. Rolm Bookbinder as part of the multidisciplinary breast clinic. Earlier this year on routine screening mammography Feb. 27th the patient was found to have a possible mass within the 1:00 position of the left breast, posterior depth. Patient returned for additional imaging and on ultrasound this was noted to have a 1.7 cm lobulated mass within the 2:00 position,  posteriorly within the left breast. Patient proceeded to undergo biopsy of this area which revealed invasive mammary carcinoma with papillary features. The tumor was estrogen receptor positive at 100% and progesterone receptor positive at 100%. Proliferation marker, Ki 67 was 9%. There was no HER-2/neu amplification. With this information the patient is now seen at for evaluation.Marland Kitchen  PREVIOUS RADIATION THERAPY: No  PAST MEDICAL HISTORY:  has a past medical history of Gallstones (08/23/03); Anemia; Hypertension; Hyperlipidemia; and Hot flashes.    PAST SURGICAL HISTORY: Past Surgical History  Procedure Laterality Date  . Cholecystectomy    . Wisdom tooth extraction    . Partial hysterectomy  1980    ovaries left in  . Tonsillectomy    . Tubal ligation      FAMILY HISTORY: family history includes Breast cancer in her mother; Cancer in her mother; Diabetes in her mother; Gout in her brother; Heart  attack in her father; Heart disease in her brother and father; Hyperlipidemia in her mother; Hypertension in her brother, mother, and sister. There is no history of Depression, Alcohol abuse, Drug abuse, Colon cancer, Uterine cancer, Ovarian cancer, or Prostate cancer.  SOCIAL HISTORY:  reports that she has never smoked. She has never used smokeless tobacco. She reports that she does not drink alcohol or use illicit drugs.  ALLERGIES: Review of patient's allergies indicates no known allergies.  MEDICATIONS:  Current Outpatient Prescriptions  Medication Sig Dispense Refill  . Calcium Carbonate-Vitamin D 600-400 MG-UNIT per tablet Take 1 tablet by mouth daily.        . Cholecalciferol (VITAMIN D) 1000 UNITS capsule Take 1,000 Units by mouth daily.        . cholestyramine (QUESTRAN) 4 GM/DOSE powder Take 0.5 packets (2 g total) by mouth 2 (two) times daily as needed (loose stools).  378 g  12  . ibuprofen (GNP IBUPROFEN) 200 MG tablet Take 200 mg by mouth as needed.        . magnesium 30 MG tablet Take 30 mg by mouth daily.        . metoprolol succinate (TOPROL-XL) 25 MG 24 hr tablet TAKE 1 TABLET TWICE A DAY  180 tablet  3  . multivitamin (THERAGRAN) per tablet Take 1 tablet by mouth daily.        . Omega-3 Fatty Acids (FISH OIL) 1000 MG CPDR Take by mouth daily.         No current facility-administered medications for this encounter.  REVIEW OF SYSTEMS:  A 15 point review of systems is documented in the electronic medical record. This was obtained by the nursing staff. However, I reviewed this with the patient to discuss relevant findings and make appropriate changes.  Prior to diagnosis the patient denied any pain in the breast area nipple discharge or bleeding. She denies any new bony pain headaches dizziness or blurred vision. She denies a problems with swelling in her left arm or hand.   PHYSICAL EXAM:  Vitals - 1 value per visit 5/00/9381  SYSTOLIC 829  DIASTOLIC 76  Pulse 64    Temperature 98.4  Respirations 18  Weight (lb) 140.8  Height _0   BMI 23.43  VISIT REPORT    Gen. this is a very pleasant 68 year old female in no acute distress. She is accompanied by her husband on evaluation today. Examination of the neck and supraclavicular region reveals no evidence of adenopathy. The axillary areas are free of adenopathy. Examination of the lungs reveals them to be clear. The heart has a regular rhythm and rate. Examination of the right breast reveals it to be large and pendulous without mass or nipple discharge. Summation of the left breast reveals some thickening in the upper-outer aspect of the left breast. She has some mild bruising in this area. No dominant masses appreciated within the breast nipple discharge or bleeding.   ECOG = 0  0 - Asymptomatic (Fully active, able to carry on all predisease activities without restriction)  LABORATORY DATA:  Lab Results  Component Value Date   WBC 4.4 08/11/2013   HGB 11.3* 08/11/2013   HCT 34.5* 08/11/2013   MCV 88.7 08/11/2013   PLT 246 08/11/2013   NEUTROABS 2.5 08/11/2013   Lab Results  Component Value Date   NA 145 08/11/2013   K 4.1 08/11/2013   CL 106 10/27/2012   CO2 28 08/11/2013   GLUCOSE 92 08/11/2013   CREATININE 0.8 08/11/2013   CALCIUM 9.9 08/11/2013      RADIOGRAPHY:  [facility]    IMPRESSION: Clinical T1c/T2 N0, stage IIA invasive papillary breast cancer, grade 1, estrogen receptor and progesterone receptor both 100% positive, with an MIB-1 of 9% and no HER-2 amplification.  The patient would appear to be a good candidate for breast conservation therapy with radiation therapy directed at the left breast. Patient does have large pendulous breasts which could present a technical challenge with breast radiation therapy. Patient may be a potential candidate for partial breast radiation therapy with the MammoSite site. Patient's tumor however is posterior near the chest wall which may not be the optimal  location for this treatment. If The patient is not a candidate for partial breast radiation therapy then she would likely be treated in the prone position with less potential skin reaction problems. I discussed the treatment course side effects and potential toxicities of radiation therapy in this situation with the patient. She does wish to proceed with breast conservation therapy rather than a mastectomy.   PLAN: The patient will be seen after she has completed her definitive surgery for coordination of her breast radiation as breast conservation therapy.  Total time spent in this encounter was 45 minutes.   ------------------------------------------------  Blair Promise, PhD, MD

## 2013-08-11 NOTE — Progress Notes (Signed)
Checked in new pt with no financial concerns. °

## 2013-08-11 NOTE — Progress Notes (Signed)
Patient ID: Kristi Carr, female   DOB: Sep 28, 1945, 68 y.o.   MRN: [id number]  Chief Complaint  Patient presents with  . Other    HPI Kristi Carr is a 68 y.o. female.  Referred by Dr Paula Compton HPI 49 yof who underwent screening mm recently with a mass in the posterior left breast.  This is category C breast density.  US shows a 1.7 cm lobulated mass in the left breast.  This has undergone biopsy showing invasive mammary carcinoma with papillary features, grade I-II, her2 not amplified and er/pr positive at 100%, Ki is 9%. She has no complaints referable to her breasts.  She comes today to discuss her options.  Past Medical History  Diagnosis Date  . Gallstones 08/23/03    x 2  . Anemia   . Hypertension   . Hyperlipidemia   . Hot flashes     Past Surgical History  Procedure Laterality Date  . Cholecystectomy    . Wisdom tooth extraction    . Partial hysterectomy  1980    ovaries left in  . Tonsillectomy    . Tubal ligation      Family History  Problem Relation Age of Onset  . Diabetes Mother   . Hyperlipidemia Mother   . Hypertension Mother   . Cancer Mother     Breast   . Breast cancer Mother   . Heart attack Father   . Heart disease Father   . Depression Neg Hx   . Alcohol abuse Neg Hx   . Drug abuse Neg Hx   . Colon cancer Neg Hx   . Uterine cancer Neg Hx   . Ovarian cancer Neg Hx   . Prostate cancer Neg Hx   . Hypertension Sister   . Heart disease Brother     CHF  EF 10%  . Hypertension Brother   . Gout Brother     Social History History  Substance Use Topics  . Smoking status: Never Smoker   . Smokeless tobacco: Never Used  . Alcohol Use: No    No Known Allergies  Current Outpatient Prescriptions  Medication Sig Dispense Refill  . Calcium Carbonate-Vitamin D 600-400 MG-UNIT per tablet Take 1 tablet by mouth daily.        . Cholecalciferol (VITAMIN D) 1000 UNITS capsule Take 1,000 Units by mouth daily.        . cholestyramine (QUESTRAN) 4  GM/DOSE powder Take 0.5 packets (2 g total) by mouth 2 (two) times daily as needed (loose stools).  378 g  12  . ibuprofen (GNP IBUPROFEN) 200 MG tablet Take 200 mg by mouth as needed.        . magnesium 30 MG tablet Take 30 mg by mouth daily.        . metoprolol succinate (TOPROL-XL) 25 MG 24 hr tablet TAKE 1 TABLET TWICE A DAY  180 tablet  3  . multivitamin (THERAGRAN) per tablet Take 1 tablet by mouth daily.        . Omega-3 Fatty Acids (FISH OIL) 1000 MG CPDR Take by mouth daily.         No current facility-administered medications for this visit.    Review of Systems Review of Systems  Constitutional: Negative for fever, chills and unexpected weight change.  HENT: Negative for congestion, hearing loss, sore throat, trouble swallowing and voice change.   Eyes: Negative for visual disturbance.  Respiratory: Negative for cough and wheezing.   Cardiovascular: Negative for  chest pain, palpitations and leg swelling.  Gastrointestinal: Negative for nausea, vomiting, abdominal pain, diarrhea, constipation, blood in stool, abdominal distention and anal bleeding.  Genitourinary: Negative for hematuria, vaginal bleeding and difficulty urinating.  Musculoskeletal: Negative for arthralgias.  Skin: Negative for rash and wound.  Neurological: Negative for seizures, syncope and headaches.  Hematological: Negative for adenopathy. Does not bruise/bleed easily.  Psychiatric/Behavioral: Negative for confusion.    There were no vitals taken for this visit.  Physical Exam Physical Exam  Vitals reviewed. Constitutional: She appears well-developed and well-nourished.  Neck: Neck supple.  Cardiovascular: Normal rate, regular rhythm and normal heart sounds.   Pulmonary/Chest: Effort normal and breath sounds normal. She has no wheezes. She has no rales. Right breast exhibits no inverted nipple, no mass, no nipple discharge, no skin change and no tenderness. Left breast exhibits no inverted nipple, no  mass, no nipple discharge, no skin change and no tenderness.  Lymphadenopathy:    She has no cervical adenopathy.    She has no axillary adenopathy.       Right: No supraclavicular adenopathy present.       Left: No supraclavicular adenopathy present.    Data Reviewed Mm,us,path reviewed  Assessment    Clinical stage 1 left breast cancer     Plan    Left breast seed guided lumpectomy, left axillary sentinel node biopsy   We discussed the staging and pathophysiology of breast cancer. We discussed all of the different options for treatment for breast cancer including surgery, chemotherapy, radiation therapy, Herceptin, and antiestrogen therapy.   We discussed a sentinel lymph node biopsy as she does not appear to having lymph node involvement right now. We discussed the performance of that with injection of radioactive tracer and blue dye. We discussed that she would have an incision underneath her axillary hairline. We discussed that there is a chance of having a positive node with a sentinel lymph node biopsy and we will await the permanent pathology to make any other first further decisions in terms of her treatment. One of these options might be to return to the operating room to perform an axillary lymph node dissection. We discussed about up to a 5% risk lifetime of chronic shoulder pain as well as lymphedema associated with a sentinel lymph node biopsy.  We discussed the options for treatment of the breast cancer which included lumpectomy versus a mastectomy. We discussed the performance of the lumpectomy with seed placement. We discussed a 5% chance of a positive margin requiring reexcision in the operating room. We also discussed that she may need radiation therapy or antiestrogen therapy or both if she undergoes lumpectomy. We discussed the mastectomy and the postoperative care for that as well. We discussed that there is no difference in her survival whether she undergoes lumpectomy  with radiation therapy or antiestrogen therapy versus a mastectomy.  We had a long discussion about mammosite vs standard radiotherapy.  I told her only complicating factor was posterior position. We may attempt mammosite then realize that is was not able to be performed either due to pathology or catheter location. She understands and is going to consider options. We discussed the risks of operation including bleeding, infection, possible reoperation. She understands her further therapy will be based on what her stages at the time of her operation.          Lamario Mani 08/11/2013, 4:02 PM

## 2013-08-11 NOTE — Progress Notes (Signed)
[place]  Telephone:([phone] [phone]     ID: [name] OB: 10/21/1945  MR#: [id number]  XTK#:[id number]  PCP: Elsie Stain, MD GYN: Paula Compton  SU: Rolm Bookbinder OTHER MD: Gery Pray  CHIEF COMPLAINT: "My mammogram showed breast cancer"  BREAST CANCER HISTORY: Sheria had routine screening mammography at [facility] 07/16/2013. This suggested a mass at the 1:00 position in the left breast. Ultrasound of the left breast and axilla 07/23/2013 showed a 1.7 cm lobulated mass at the 2:00 position, which was hypoechoic. The left axilla was unremarkable.  Biopsy of the mass in question 08/02/2013 showed ([id number]) and invasive ductal (papillary) carcinoma, grade 1, estrogen receptor and progesterone receptor both 100% positive with strong staining intensity, with an MIB-1 of 9% and no HER-2 amplification, the signals ratio being 1.08 and date number per cell 1.95.  Her subsequent history is as detailed below  INTERVAL HISTORY: Johnnay was evaluated in the multidisciplinary breast cancer clinic 08/11/2013 accompanied by her husband [place]: There were no specific symptoms leading to the original mammogram, which was routinely scheduled. The patient denies unusual headaches, visual changes, nausea, vomiting, stiff neck, dizziness, or gait imbalance. There has been no cough, phlegm production, or pleurisy, no chest pain or pressure, and no change in bowel or bladder habits. The patient denies fever, rash, bleeding, unexplained fatigue or unexplained weight loss. She is having some mild hot flashes. A detailed review of systems was otherwise entirely negative.  PAST MEDICAL HISTORY: Past Medical History  Diagnosis Date  . Gallstones 08/23/03    x 2  . Anemia   . Hypertension   . Hyperlipidemia   . Hot flashes     PAST SURGICAL HISTORY: Past Surgical History  Procedure Laterality Date  . Cholecystectomy    . Wisdom tooth  extraction    . Partial hysterectomy  1980    ovaries left in  . Tonsillectomy    . Tubal ligation      FAMILY HISTORY Family History  Problem Relation Age of Onset  . Diabetes Mother   . Hyperlipidemia Mother   . Hypertension Mother   . Cancer Mother     Breast   . Breast cancer Mother   . Heart attack Father   . Heart disease Father   . Depression Neg Hx   . Alcohol abuse Neg Hx   . Drug abuse Neg Hx   . Colon cancer Neg Hx   . Uterine cancer Neg Hx   . Ovarian cancer Neg Hx   . Prostate cancer Neg Hx   . Hypertension Sister   . Heart disease Brother     CHF  EF 10%  . Hypertension Brother   . Gout Brother    the patient's father died from a myocardial infarction at age 55. The patient's mother died from intestinal blockage at age 85. The patient had one brother, one sister. The patient's mother was diagnosed with breast cancer at age 95. There is no other history of cancer in the family to her knowledge.  GYNECOLOGIC HISTORY:  Menarche age 38, first live birth age 68, the patient is [place] P2. She underwent hysterectomy in 1979 but her ovaries are still in place. She did not use hormone replacement.  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 Her husband Jenny Reichmann used to work for [employer]. Son Shanon Brow lives in [place] where he works as a Librarian, academic for [employer]. Son Randall Hiss lives  in [place] as Government social research officer for [employer] and environmental services. The patient has 4 grandchildren. She is a Psychologist, forensic     ADVANCED DIRECTIVES: Not in place   HEALTH MAINTENANCE: History  Substance Use Topics  . Smoking status: Never Smoker   . Smokeless tobacco: Never Used  . Alcohol Use: No     Colonoscopy: 2011  PAP: Status post hysterectomy  Bone density: Remote; showed osteopenia  Lipid panel:  No Known Allergies  Current Outpatient Prescriptions  Medication Sig Dispense Refill  . Calcium Carbonate-Vitamin D 600-400 MG-UNIT per tablet Take 1 tablet  by mouth daily.        . Cholecalciferol (VITAMIN D) 1000 UNITS capsule Take 1,000 Units by mouth daily.        . Cholecalciferol (VITAMIN D3) 50000 UNITS CAPS Take by mouth every 30 (thirty) days.        . cholestyramine (QUESTRAN) 4 GM/DOSE powder Take 0.5 packets (2 g total) by mouth 2 (two) times daily as needed (loose stools).  378 g  12  . ibuprofen (GNP IBUPROFEN) 200 MG tablet Take 200 mg by mouth as needed.        . magnesium 30 MG tablet Take 30 mg by mouth daily.        . metoprolol succinate (TOPROL-XL) 25 MG 24 hr tablet TAKE 1 TABLET TWICE A DAY  180 tablet  3  . multivitamin (THERAGRAN) per tablet Take 1 tablet by mouth daily.        . Omega-3 Fatty Acids (FISH OIL) 1000 MG CPDR Take by mouth daily.         No current facility-administered medications for this visit.    OBJECTIVE: Middle-aged white woman in no acute distress Filed Vitals:   08/11/13 1246  BP: 176/76  Pulse: 64  Temp: 98.4 F (36.9 C)  Resp: 18     Body mass index is 23.43 kg/(m^2).    ECOG FS:0 - Asymptomatic  Ocular: Sclerae unicteric, pupils equal, round and reactive to light Ear-nose-throat: Oropharynx clear, dentition fair Lymphatic: No cervical or supraclavicular adenopathy Lungs no rales or rhonchi, good excursion bilaterally Heart regular rate and rhythm, no murmur appreciated Abd soft, nontender, positive bowel sounds MSK no focal spinal tenderness, no joint edema Neuro: non-focal, well-oriented, appropriate affect Breasts: The right breast is unremarkable. I do not palpate a mass in the left breast. There is minimal tenderness from the recent biopsy. There are no skin or nipple changes of concern. The left axilla is benign.   LAB RESULTS:  CMP     Component Value Date/Time   NA 145 08/11/2013 1200   NA 144 10/27/2012 0900   K 4.1 08/11/2013 1200   K 4.5 10/27/2012 0900   CL 106 10/27/2012 0900   CO2 28 08/11/2013 1200   CO2 27 10/27/2012 0900   GLUCOSE 92 08/11/2013 1200   GLUCOSE 91  10/27/2012 0900   BUN 14.7 08/11/2013 1200   BUN 16 10/27/2012 0900   CREATININE 0.8 08/11/2013 1200   CREATININE 0.8 10/27/2012 0900   CALCIUM 9.9 08/11/2013 1200   CALCIUM 9.5 10/27/2012 0900   PROT 7.6 08/11/2013 1200   PROT 8.0 10/27/2012 0900   ALBUMIN 4.0 08/11/2013 1200   ALBUMIN 4.1 10/27/2012 0900   AST 14 08/11/2013 1200   AST 15 10/27/2012 0900   ALT 11 08/11/2013 1200   ALT 11 10/27/2012 0900   ALKPHOS 74 08/11/2013 1200   ALKPHOS 67 10/27/2012 0900   BILITOT 0.91 08/11/2013  1200   BILITOT 1.0 10/27/2012 0900   GFRNONAA 90 06/22/2008 0958   GFRAA 109 06/22/2008 0958    I No results found for this basename: SPEP, UPEP,  kappa and lambda light chains    Lab Results  Component Value Date   WBC 4.4 08/11/2013   NEUTROABS 2.5 08/11/2013   HGB 11.3* 08/11/2013   HCT 34.5* 08/11/2013   MCV 88.7 08/11/2013   PLT 246 08/11/2013      Chemistry      Component Value Date/Time   NA 145 08/11/2013 1200   NA 144 10/27/2012 0900   K 4.1 08/11/2013 1200   K 4.5 10/27/2012 0900   CL 106 10/27/2012 0900   CO2 28 08/11/2013 1200   CO2 27 10/27/2012 0900   BUN 14.7 08/11/2013 1200   BUN 16 10/27/2012 0900   CREATININE 0.8 08/11/2013 1200   CREATININE 0.8 10/27/2012 0900      Component Value Date/Time   CALCIUM 9.9 08/11/2013 1200   CALCIUM 9.5 10/27/2012 0900   ALKPHOS 74 08/11/2013 1200   ALKPHOS 67 10/27/2012 0900   AST 14 08/11/2013 1200   AST 15 10/27/2012 0900   ALT 11 08/11/2013 1200   ALT 11 10/27/2012 0900   BILITOT 0.91 08/11/2013 1200   BILITOT 1.0 10/27/2012 0900       No results found for this basename: LABCA2    No components found with this basename: LABCA125    No results found for this basename: INR,  in the last 168 hours  Urinalysis No results found for this basename: colorurine, appearanceur, labspec, phurine, glucoseu, hgbur, bilirubinur, ketonesur, proteinur, urobilinogen, nitrite, leukocytesur    STUDIES: Outside films reviewed  ASSESSMENT: 68 y.o. [place] woman  status post left breast biopsy 08/02/2013 for a clinical T2 N0, stage IIA invasive papillary breast cancer, grade 1, estrogen receptor and progesterone receptor both 100% positive, with an MIB-1 of 9% and no HER-2 amplification  PLAN: We spent the better part of today's hour-long appointment discussing the biology of breast cancer in general, and the specifics of the patient's tumor in particular.  Aireal understands the pattern of her breast cancer is luminal A like. That means in general her prognosis is good and that the cancer is unlikely to respond to chemotherapy. For that reason I do not feel it is necessary to send for an Oncotype, although that would be the recommendation according to NCCN guidelines.   We discussed the stage, grade and proliferation fraction of her tumor in detail in detail and also the fact that once she completes her local treatment with surgery and radiation she will be an excellent candidate for antiestrogen therapy. We discussed nutrition issues as well.  I have made Alfreda a return appointment with me in approximately 2-1/2 months. By then she should be done with her radiation treatment and we should be ready to start anti-estrogens. She does have a history of what sounds like mild osteopenia. She will have a bone density shortly before her return visit with me.  The patient has a good understanding of the overall plan. She agrees with it. She knows a goal of treatment in her cases cure. She will call with any problems that may develop before next visit here.   Chauncey Cruel, MD   08/11/2013 2:33 PM

## 2013-08-12 ENCOUNTER — Telehealth: Payer: Self-pay | Admitting: *Deleted

## 2013-08-12 NOTE — Progress Notes (Signed)
[place] Psychosocial Distress Screening Clinical Social Work  Clinical Social Work visited with Patient at breast clinic.  The patient scored a 6 on the Psychosocial Distress Thermometer which indicates moderate distress. Clinical Social Worker Intern spoke with Patient to assess for distress and other psychosocial needs. Patient was accompanied by husband.  Patient was relieved after receiving information about diagnosis.  Patient was given written information about Patient and Support services.  Patient agrees to seek out further assistance as needed.   Clinical Social Worker follow up needed: no  If yes, follow up plan:   Anquanette Bahner S. [place] Work Intern [employer] [phone]

## 2013-08-12 NOTE — Telephone Encounter (Signed)
Faxed Care Plan and notes to [place] at [place].  Faxed Care Plan to PCP.  Took Care Plan to HIM to scan.

## 2013-08-13 ENCOUNTER — Telehealth (INDEPENDENT_AMBULATORY_CARE_PROVIDER_SITE_OTHER): Payer: Self-pay

## 2013-08-13 ENCOUNTER — Other Ambulatory Visit (INDEPENDENT_AMBULATORY_CARE_PROVIDER_SITE_OTHER): Payer: Self-pay | Admitting: General Surgery

## 2013-08-13 ENCOUNTER — Ambulatory Visit (INDEPENDENT_AMBULATORY_CARE_PROVIDER_SITE_OTHER): Payer: Medicare Other | Admitting: General Surgery

## 2013-08-13 ENCOUNTER — Telehealth: Payer: Self-pay | Admitting: Oncology

## 2013-08-13 DIAGNOSIS — C50412 Malignant neoplasm of upper-outer quadrant of left female breast: Secondary | ICD-10-CM

## 2013-08-13 NOTE — Telephone Encounter (Signed)
Called pt to introduce my self since the pt has not been seen at [facility] yet only the [facility] at the cancer ctr. I wanted to see if the pt had decided on the type of surgery she needs to have by Dr Donne Hazel a mammosite vs. Lt br lumpectomy w/ radiation. The pt has decided to go with at left br lumpectomy/SNBX/seed along with the radiation. I will notify Dr Donne Hazel and turn pt's surgical orders into scheduling today. The pt understands.

## 2013-08-19 ENCOUNTER — Telehealth: Payer: Self-pay | Admitting: *Deleted

## 2013-08-19 NOTE — Telephone Encounter (Signed)
Called and spoke with patient from [facility] 08/11/13.  No questions or concerns at this time.  Encouraged patient to call with any needs.

## 2013-08-27 ENCOUNTER — Encounter (HOSPITAL_BASED_OUTPATIENT_CLINIC_OR_DEPARTMENT_OTHER): Payer: Self-pay | Admitting: *Deleted

## 2013-08-27 NOTE — Progress Notes (Signed)
Pt to come in for ekg-had cbc cmet cc-08/11/13-

## 2013-08-30 ENCOUNTER — Encounter (HOSPITAL_BASED_OUTPATIENT_CLINIC_OR_DEPARTMENT_OTHER)
Admission: RE | Admit: 2013-08-30 | Discharge: 2013-08-30 | Disposition: A | Discharge status: Home / Self Care | Payer: Medicare HMO | Source: Ambulatory Visit | Attending: General Surgery | Admitting: General Surgery

## 2013-08-30 ENCOUNTER — Encounter (HOSPITAL_BASED_OUTPATIENT_CLINIC_OR_DEPARTMENT_OTHER): Payer: Self-pay | Admitting: *Deleted

## 2013-08-30 DIAGNOSIS — Z01812 Encounter for preprocedural laboratory examination: Secondary | ICD-10-CM | POA: Insufficient documentation

## 2013-08-30 NOTE — Progress Notes (Signed)
Pt surgery r/s from 4/16 to 4/20-having seeds 930am [facility] then come here for surgery

## 2013-09-02 ENCOUNTER — Encounter (HOSPITAL_COMMUNITY): Payer: Medicare HMO

## 2013-09-06 ENCOUNTER — Ambulatory Visit (HOSPITAL_BASED_OUTPATIENT_CLINIC_OR_DEPARTMENT_OTHER)
Admission: RE | Admit: 2013-09-06 | Discharge: 2013-09-06 | Disposition: A | Discharge status: Home / Self Care | Payer: Medicare HMO | Source: Ambulatory Visit | Attending: General Surgery | Admitting: General Surgery

## 2013-09-06 ENCOUNTER — Ambulatory Visit (HOSPITAL_BASED_OUTPATIENT_CLINIC_OR_DEPARTMENT_OTHER): Payer: Medicare HMO | Admitting: Certified Registered"

## 2013-09-06 ENCOUNTER — Encounter (HOSPITAL_BASED_OUTPATIENT_CLINIC_OR_DEPARTMENT_OTHER)
Admission: RE | Disposition: A | Discharge status: Home / Self Care | Payer: Self-pay | Source: Ambulatory Visit | Attending: General Surgery

## 2013-09-06 ENCOUNTER — Encounter (HOSPITAL_BASED_OUTPATIENT_CLINIC_OR_DEPARTMENT_OTHER): Payer: Self-pay

## 2013-09-06 ENCOUNTER — Encounter (HOSPITAL_BASED_OUTPATIENT_CLINIC_OR_DEPARTMENT_OTHER): Payer: Medicare HMO | Admitting: Certified Registered"

## 2013-09-06 ENCOUNTER — Ambulatory Visit (HOSPITAL_COMMUNITY)
Admission: RE | Admit: 2013-09-06 | Discharge: 2013-09-06 | Disposition: A | Discharge status: Home / Self Care | Payer: Medicare HMO | Source: Ambulatory Visit | Attending: General Surgery | Admitting: General Surgery

## 2013-09-06 DIAGNOSIS — C50412 Malignant neoplasm of upper-outer quadrant of left female breast: Secondary | ICD-10-CM

## 2013-09-06 DIAGNOSIS — M25519 Pain in unspecified shoulder: Secondary | ICD-10-CM | POA: Insufficient documentation

## 2013-09-06 DIAGNOSIS — I10 Essential (primary) hypertension: Secondary | ICD-10-CM | POA: Insufficient documentation

## 2013-09-06 DIAGNOSIS — D0590 Unspecified type of carcinoma in situ of unspecified breast: Secondary | ICD-10-CM

## 2013-09-06 DIAGNOSIS — E785 Hyperlipidemia, unspecified: Secondary | ICD-10-CM | POA: Insufficient documentation

## 2013-09-06 DIAGNOSIS — G8929 Other chronic pain: Secondary | ICD-10-CM | POA: Insufficient documentation

## 2013-09-06 DIAGNOSIS — C50419 Malignant neoplasm of upper-outer quadrant of unspecified female breast: Secondary | ICD-10-CM | POA: Insufficient documentation

## 2013-09-06 HISTORY — PX: BREAST LUMPECTOMY WITH SENTINEL LYMPH NODE BIOPSY: SHX5597

## 2013-09-06 HISTORY — DX: Presence of spectacles and contact lenses: Z97.3

## 2013-09-06 SURGERY — RADIOACTIVE SEED GUIDED PARTIAL MASTECTOMY WITH AXILLARY SENTINEL LYMPH NODE BIOPSY AND AXILLARY LYMPH NODE DISSECTION
Anesthesia: General | Site: Breast | Laterality: Left

## 2013-09-06 MED ORDER — MIDAZOLAM HCL 2 MG/2ML IJ SOLN
1.0000 mg | INTRAMUSCULAR | Status: DC | PRN
  Administered 2013-09-06: 2 mg via INTRAVENOUS

## 2013-09-06 MED ORDER — FENTANYL CITRATE 0.05 MG/ML IJ SOLN
INTRAMUSCULAR | Status: AC
  Filled 2013-09-06: qty 6

## 2013-09-06 MED ORDER — BUPIVACAINE-EPINEPHRINE PF 0.5-1:200000 % IJ SOLN
INTRAMUSCULAR | Status: DC | PRN
  Administered 2013-09-06: 20 mL via PERINEURAL

## 2013-09-06 MED ORDER — SODIUM CHLORIDE 0.9 % IJ SOLN
INTRAMUSCULAR | Status: AC
  Filled 2013-09-06: qty 10

## 2013-09-06 MED ORDER — MIDAZOLAM HCL 5 MG/5ML IJ SOLN
INTRAMUSCULAR | Status: DC | PRN
  Administered 2013-09-06: 1 mg via INTRAVENOUS

## 2013-09-06 MED ORDER — BUPIVACAINE HCL (PF) 0.25 % IJ SOLN
INTRAMUSCULAR | Status: DC | PRN
  Administered 2013-09-06: 10 mL

## 2013-09-06 MED ORDER — FENTANYL CITRATE 0.05 MG/ML IJ SOLN: 25.0000 ug | INTRAMUSCULAR | Status: DC | PRN

## 2013-09-06 MED ORDER — DEXAMETHASONE SODIUM PHOSPHATE 4 MG/ML IJ SOLN
INTRAMUSCULAR | Status: DC | PRN
  Administered 2013-09-06: 10 mg via INTRAVENOUS

## 2013-09-06 MED ORDER — PROMETHAZINE HCL 25 MG/ML IJ SOLN: 6.2500 mg | INTRAMUSCULAR | Status: DC | PRN

## 2013-09-06 MED ORDER — FENTANYL CITRATE 0.05 MG/ML IJ SOLN
50.0000 ug | INTRAMUSCULAR | Status: DC | PRN
  Administered 2013-09-06: 100 ug via INTRAVENOUS

## 2013-09-06 MED ORDER — BUPIVACAINE HCL (PF) 0.25 % IJ SOLN
INTRAMUSCULAR | Status: AC
  Filled 2013-09-06: qty 30

## 2013-09-06 MED ORDER — ONDANSETRON HCL 4 MG/2ML IJ SOLN
INTRAMUSCULAR | Status: DC | PRN
  Administered 2013-09-06: 4 mg via INTRAVENOUS

## 2013-09-06 MED ORDER — METHYLENE BLUE 1 % INJ SOLN
INTRAMUSCULAR | Status: AC
  Filled 2013-09-06: qty 10

## 2013-09-06 MED ORDER — LACTATED RINGERS IV SOLN
INTRAVENOUS | Status: DC
  Administered 2013-09-06 (×2): via INTRAVENOUS

## 2013-09-06 MED ORDER — LIDOCAINE HCL (CARDIAC) 20 MG/ML IV SOLN
INTRAVENOUS | Status: DC | PRN
  Administered 2013-09-06: 30 mg via INTRAVENOUS

## 2013-09-06 MED ORDER — OXYCODONE HCL 5 MG/5ML PO SOLN: 5.0000 mg | Freq: Once | ORAL | Status: DC | PRN

## 2013-09-06 MED ORDER — TECHNETIUM TC 99M SULFUR COLLOID FILTERED
1.0000 | Freq: Once | INTRAVENOUS | Status: AC | PRN
  Administered 2013-09-06: 1 via INTRADERMAL

## 2013-09-06 MED ORDER — MIDAZOLAM HCL 2 MG/2ML IJ SOLN
INTRAMUSCULAR | Status: AC
  Filled 2013-09-06: qty 2

## 2013-09-06 MED ORDER — OXYCODONE HCL 5 MG PO TABS: 5.0000 mg | ORAL_TABLET | Freq: Once | ORAL | Status: DC | PRN

## 2013-09-06 MED ORDER — CEFAZOLIN SODIUM-DEXTROSE 2-3 GM-% IV SOLR
2.0000 g | INTRAVENOUS | Status: AC
  Administered 2013-09-06: 2 g via INTRAVENOUS

## 2013-09-06 MED ORDER — PROPOFOL 10 MG/ML IV BOLUS
INTRAVENOUS | Status: DC | PRN
  Administered 2013-09-06: 150 mg via INTRAVENOUS

## 2013-09-06 MED ORDER — OXYCODONE-ACETAMINOPHEN 10-325 MG PO TABS: 1.0000 | ORAL_TABLET | Freq: Four times a day (QID) | ORAL | Status: DC | PRN

## 2013-09-06 MED ORDER — CEFAZOLIN SODIUM-DEXTROSE 2-3 GM-% IV SOLR
INTRAVENOUS | Status: AC
  Filled 2013-09-06: qty 50

## 2013-09-06 MED ORDER — EPHEDRINE SULFATE 50 MG/ML IJ SOLN
INTRAMUSCULAR | Status: DC | PRN
  Administered 2013-09-06: 10 mg via INTRAVENOUS

## 2013-09-06 MED ORDER — FENTANYL CITRATE 0.05 MG/ML IJ SOLN
INTRAMUSCULAR | Status: AC
  Filled 2013-09-06: qty 2

## 2013-09-06 MED ORDER — FENTANYL CITRATE 0.05 MG/ML IJ SOLN
INTRAMUSCULAR | Status: DC | PRN
  Administered 2013-09-06: 25 ug via INTRAVENOUS

## 2013-09-06 SURGICAL SUPPLY — 59 items
ADH SKN CLS APL DERMABOND .7 (GAUZE/BANDAGES/DRESSINGS) ×1
APL SKNCLS STERI-STRIP NONHPOA (GAUZE/BANDAGES/DRESSINGS) ×1
APPLIER CLIP 9.375 MED OPEN (MISCELLANEOUS) ×2
APR CLP MED 9.3 20 MLT OPN (MISCELLANEOUS) ×1
BENZOIN TINCTURE PRP APPL 2/3 (GAUZE/BANDAGES/DRESSINGS) ×2 IMPLANT
BINDER BREAST LRG (GAUZE/BANDAGES/DRESSINGS) ×1 IMPLANT
BINDER BREAST MEDIUM (GAUZE/BANDAGES/DRESSINGS) IMPLANT
BINDER BREAST XLRG (GAUZE/BANDAGES/DRESSINGS) IMPLANT
BINDER BREAST XXLRG (GAUZE/BANDAGES/DRESSINGS) IMPLANT
BLADE SURG 15 STRL LF DISP TIS (BLADE) ×1 IMPLANT
BLADE SURG 15 STRL SS (BLADE) ×2
CANISTER SUC SOCK COL 7IN (MISCELLANEOUS) IMPLANT
CANISTER SUCT 1200ML W/VALVE (MISCELLANEOUS) IMPLANT
CHLORAPREP W/TINT 26ML (MISCELLANEOUS) ×2 IMPLANT
CLIP APPLIE 9.375 MED OPEN (MISCELLANEOUS) ×1 IMPLANT
COVER MAYO STAND STRL (DRAPES) ×2 IMPLANT
COVER PROBE W GEL 5X96 (DRAPES) ×2 IMPLANT
COVER TABLE BACK 60X90 (DRAPES) ×2 IMPLANT
DECANTER SPIKE VIAL GLASS SM (MISCELLANEOUS) IMPLANT
DERMABOND ADVANCED (GAUZE/BANDAGES/DRESSINGS) ×1
DERMABOND ADVANCED .7 DNX12 (GAUZE/BANDAGES/DRESSINGS) ×1 IMPLANT
DEVICE DUBIN W/COMP PLATE 8390 (MISCELLANEOUS) ×2 IMPLANT
DRAPE LAPAROSCOPIC ABDOMINAL (DRAPES) ×2 IMPLANT
DRSG TEGADERM 4X4.75 (GAUZE/BANDAGES/DRESSINGS) ×2 IMPLANT
ELECT COATED BLADE 2.86 ST (ELECTRODE) ×2 IMPLANT
ELECT REM PT RETURN 9FT ADLT (ELECTROSURGICAL) ×2
ELECTRODE REM PT RTRN 9FT ADLT (ELECTROSURGICAL) ×1 IMPLANT
GLOVE BIO SURGEON STRL SZ7 (GLOVE) ×5 IMPLANT
GLOVE BIOGEL PI IND STRL 7.5 (GLOVE) ×1 IMPLANT
GLOVE BIOGEL PI INDICATOR 7.5 (GLOVE) ×2
GOWN STRL REUS W/ TWL LRG LVL3 (GOWN DISPOSABLE) ×2 IMPLANT
GOWN STRL REUS W/TWL LRG LVL3 (GOWN DISPOSABLE) ×4
KIT MARKER MARGIN INK (KITS) ×2 IMPLANT
NDL HYPO 25X1 1.5 SAFETY (NEEDLE) ×1 IMPLANT
NEEDLE HYPO 25X1 1.5 SAFETY (NEEDLE) ×2 IMPLANT
NS IRRIG 1000ML POUR BTL (IV SOLUTION) ×1 IMPLANT
PACK BASIN DAY SURGERY FS (CUSTOM PROCEDURE TRAY) ×2 IMPLANT
PENCIL BUTTON HOLSTER BLD 10FT (ELECTRODE) ×2 IMPLANT
SHEET MEDIUM DRAPE 40X70 STRL (DRAPES) IMPLANT
SLEEVE SCD COMPRESS KNEE MED (MISCELLANEOUS) ×2 IMPLANT
SPONGE GAUZE 4X4 12PLY STER LF (GAUZE/BANDAGES/DRESSINGS) ×2 IMPLANT
SPONGE LAP 4X18 X RAY DECT (DISPOSABLE) ×3 IMPLANT
STAPLER VISISTAT 35W (STAPLE) ×2 IMPLANT
STOCKINETTE IMPERVIOUS LG (DRAPES) IMPLANT
STRIP CLOSURE SKIN 1/2X4 (GAUZE/BANDAGES/DRESSINGS) ×2 IMPLANT
SUT ETHILON 2 0 FS 18 (SUTURE) IMPLANT
SUT MNCRL AB 4-0 PS2 18 (SUTURE) ×2 IMPLANT
SUT MON AB 5-0 PS2 18 (SUTURE) IMPLANT
SUT SILK 2 0 SH (SUTURE) IMPLANT
SUT VIC AB 2-0 SH 27 (SUTURE) ×4
SUT VIC AB 2-0 SH 27XBRD (SUTURE) ×1 IMPLANT
SUT VIC AB 3-0 SH 27 (SUTURE) ×2
SUT VIC AB 3-0 SH 27X BRD (SUTURE) ×1 IMPLANT
SUT VIC AB 5-0 PS2 18 (SUTURE) IMPLANT
SYR CONTROL 10ML LL (SYRINGE) ×2 IMPLANT
TOWEL OR 17X24 6PK STRL BLUE (TOWEL DISPOSABLE) ×2 IMPLANT
TOWEL OR NON WOVEN STRL DISP B (DISPOSABLE) ×2 IMPLANT
TUBE CONNECTING 20X1/4 (TUBING) ×2 IMPLANT
YANKAUER SUCT BULB TIP NO VENT (SUCTIONS) IMPLANT

## 2013-09-06 NOTE — Transfer of Care (Signed)
Immediate Anesthesia Transfer of Care Note  Patient: Kristi Carr  Procedure(s) Performed: Procedure(s): LEFT BREAST SEED GUIDED LUMPECTOMY WITH LEFT AXILLARY SENTINEL NODE BIOPSY (Left)  Patient Location: PACU  Anesthesia Type:GA combined with regional for post-op pain  Level of Consciousness: awake, alert , oriented and patient cooperative  Airway & Oxygen Therapy: Patient Spontanous Breathing and Patient connected to face mask oxygen  Post-op Assessment: Report given to PACU RN and Post -op Vital signs reviewed and stable  Post vital signs: Reviewed and stable  Complications: No apparent anesthesia complications

## 2013-09-06 NOTE — Anesthesia Postprocedure Evaluation (Signed)
  Anesthesia Post-op Note  Patient: Kristi Carr  Procedure(s) Performed: Procedure(s): LEFT BREAST SEED GUIDED LUMPECTOMY WITH LEFT AXILLARY SENTINEL NODE BIOPSY (Left)  Patient Location: PACU  Anesthesia Type:GA combined with regional for post-op pain  Level of Consciousness: awake  Airway and Oxygen Therapy: Patient Spontanous Breathing  Post-op Pain: none  Post-op Assessment: Post-op Vital signs reviewed, Patient's Cardiovascular Status Stable, Respiratory Function Stable, Patent Airway, No signs of Nausea or vomiting and Pain level controlled  Post-op Vital Signs: Reviewed and stable  Last Vitals:  Filed Vitals:   09/06/13 1404  BP: 128/58  Pulse: 84  Temp:   Resp: 16    Complications: No apparent anesthesia complications

## 2013-09-06 NOTE — Anesthesia Procedure Notes (Addendum)
Anesthesia Regional Block:  Pectoralis block  Pre-Anesthetic Checklist: ,, timeout performed, Correct Patient, Correct Site, Correct Laterality, Correct Procedure, Correct Position, site marked, Risks and benefits discussed,  Surgical consent,  Pre-op evaluation,  At surgeon's request and post-op pain management  Laterality: Left and Upper  Prep: chloraprep       Needles:  Injection technique: Single-shot  Needle Type: Echogenic Needle     Needle Length: 9cm 9 cm Needle Gauge: 21 and 21 G    Additional Needles:  Procedures: ultrasound guided (picture in chart) Pectoralis block Narrative:  Start time: 09/06/2013 11:35 AM End time: 09/06/2013 11:45 AM Injection made incrementally with aspirations every 5 mL.  Performed by: Personally  Anesthesiologist: Lorrene Reid, MD   Procedure Name: LMA Insertion Date/Time: 09/06/2013 12:00 PM Performed by: Kainat Pizana Pre-anesthesia Checklist: Patient identified, Emergency Drugs available, Suction available and Patient being monitored Patient Re-evaluated:Patient Re-evaluated prior to inductionOxygen Delivery Method: Circle System Utilized Preoxygenation: Pre-oxygenation with 100% oxygen Intubation Type: IV induction Ventilation: Mask ventilation without difficulty LMA: LMA inserted LMA Size: 4.0 Number of attempts: 1 Airway Equipment and Method: bite block Placement Confirmation: positive ETCO2 Tube secured with: Tape Dental Injury: Teeth and Oropharynx as per pre-operative assessment

## 2013-09-06 NOTE — H&P (View-Only) (Signed)
Patient ID: Kristi Carr, female   DOB: 05/02/1946, 67 y.o.   MRN: [id number]  Chief Complaint  Patient presents with  . Other    HPI Kristi Carr is a 67 y.o. female.  Referred by Dr Kathy Richardson HPI 67 yof who underwent screening mm recently with a mass in the posterior left breast.  This is category C breast density.  US shows a 1.7 cm lobulated mass in the left breast.  This has undergone biopsy showing invasive mammary carcinoma with papillary features, grade I-II, her2 not amplified and er/pr positive at 100%, Ki is 9%. She has no complaints referable to her breasts.  She comes today to discuss her options.  Past Medical History  Diagnosis Date  . Gallstones 08/23/03    x 2  . Anemia   . Hypertension   . Hyperlipidemia   . Hot flashes     Past Surgical History  Procedure Laterality Date  . Cholecystectomy    . Wisdom tooth extraction    . Partial hysterectomy  1980    ovaries left in  . Tonsillectomy    . Tubal ligation      Family History  Problem Relation Age of Onset  . Diabetes Mother   . Hyperlipidemia Mother   . Hypertension Mother   . Cancer Mother     Breast   . Breast cancer Mother   . Heart attack Father   . Heart disease Father   . Depression Neg Hx   . Alcohol abuse Neg Hx   . Drug abuse Neg Hx   . Colon cancer Neg Hx   . Uterine cancer Neg Hx   . Ovarian cancer Neg Hx   . Prostate cancer Neg Hx   . Hypertension Sister   . Heart disease Brother     CHF  EF 10%  . Hypertension Brother   . Gout Brother     Social History History  Substance Use Topics  . Smoking status: Never Smoker   . Smokeless tobacco: Never Used  . Alcohol Use: No    No Known Allergies  Current Outpatient Prescriptions  Medication Sig Dispense Refill  . Calcium Carbonate-Vitamin D 600-400 MG-UNIT per tablet Take 1 tablet by mouth daily.        . Cholecalciferol (VITAMIN D) 1000 UNITS capsule Take 1,000 Units by mouth daily.        . cholestyramine (QUESTRAN) 4  GM/DOSE powder Take 0.5 packets (2 g total) by mouth 2 (two) times daily as needed (loose stools).  378 g  12  . ibuprofen (GNP IBUPROFEN) 200 MG tablet Take 200 mg by mouth as needed.        . magnesium 30 MG tablet Take 30 mg by mouth daily.        . metoprolol succinate (TOPROL-XL) 25 MG 24 hr tablet TAKE 1 TABLET TWICE A DAY  180 tablet  3  . multivitamin (THERAGRAN) per tablet Take 1 tablet by mouth daily.        . Omega-3 Fatty Acids (FISH OIL) 1000 MG CPDR Take by mouth daily.         No current facility-administered medications for this visit.    Review of Systems Review of Systems  Constitutional: Negative for fever, chills and unexpected weight change.  HENT: Negative for congestion, hearing loss, sore throat, trouble swallowing and voice change.   Eyes: Negative for visual disturbance.  Respiratory: Negative for cough and wheezing.   Cardiovascular: Negative for   chest pain, palpitations and leg swelling.  Gastrointestinal: Negative for nausea, vomiting, abdominal pain, diarrhea, constipation, blood in stool, abdominal distention and anal bleeding.  Genitourinary: Negative for hematuria, vaginal bleeding and difficulty urinating.  Musculoskeletal: Negative for arthralgias.  Skin: Negative for rash and wound.  Neurological: Negative for seizures, syncope and headaches.  Hematological: Negative for adenopathy. Does not bruise/bleed easily.  Psychiatric/Behavioral: Negative for confusion.    There were no vitals taken for this visit.  Physical Exam Physical Exam  Vitals reviewed. Constitutional: She appears well-developed and well-nourished.  Neck: Neck supple.  Cardiovascular: Normal rate, regular rhythm and normal heart sounds.   Pulmonary/Chest: Effort normal and breath sounds normal. She has no wheezes. She has no rales. Right breast exhibits no inverted nipple, no mass, no nipple discharge, no skin change and no tenderness. Left breast exhibits no inverted nipple, no  mass, no nipple discharge, no skin change and no tenderness.  Lymphadenopathy:    She has no cervical adenopathy.    She has no axillary adenopathy.       Right: No supraclavicular adenopathy present.       Left: No supraclavicular adenopathy present.    Data Reviewed Mm,us,path reviewed  Assessment    Clinical stage 1 left breast cancer     Plan    Left breast seed guided lumpectomy, left axillary sentinel node biopsy   We discussed the staging and pathophysiology of breast cancer. We discussed all of the different options for treatment for breast cancer including surgery, chemotherapy, radiation therapy, Herceptin, and antiestrogen therapy.   We discussed a sentinel lymph node biopsy as she does not appear to having lymph node involvement right now. We discussed the performance of that with injection of radioactive tracer and blue dye. We discussed that she would have an incision underneath her axillary hairline. We discussed that there is a chance of having a positive node with a sentinel lymph node biopsy and we will await the permanent pathology to make any other first further decisions in terms of her treatment. One of these options might be to return to the operating room to perform an axillary lymph node dissection. We discussed about up to a 5% risk lifetime of chronic shoulder pain as well as lymphedema associated with a sentinel lymph node biopsy.  We discussed the options for treatment of the breast cancer which included lumpectomy versus a mastectomy. We discussed the performance of the lumpectomy with seed placement. We discussed a 5% chance of a positive margin requiring reexcision in the operating room. We also discussed that she may need radiation therapy or antiestrogen therapy or both if she undergoes lumpectomy. We discussed the mastectomy and the postoperative care for that as well. We discussed that there is no difference in her survival whether she undergoes lumpectomy  with radiation therapy or antiestrogen therapy versus a mastectomy.  We had a long discussion about mammosite vs standard radiotherapy.  I told her only complicating factor was posterior position. We may attempt mammosite then realize that is was not able to be performed either due to pathology or catheter location. She understands and is going to consider options. We discussed the risks of operation including bleeding, infection, possible reoperation. She understands her further therapy will be based on what her stages at the time of her operation.          Zyrion Coey 08/11/2013, 4:02 PM    

## 2013-09-06 NOTE — Anesthesia Preprocedure Evaluation (Addendum)
Anesthesia Evaluation  Patient identified by MRN, date of birth, ID band Patient awake    Reviewed: Allergy & Precautions, H&P , NPO status , Patient's Chart, lab work & pertinent test results  Airway       Dental   Pulmonary          Cardiovascular hypertension,     Neuro/Psych    GI/Hepatic   Endo/Other    Renal/GU Renal disease     Musculoskeletal   Abdominal   Peds  Hematology  (+) anemia ,   Anesthesia Other Findings   Reproductive/Obstetrics                          Anesthesia Physical Anesthesia Plan  ASA: I  Anesthesia Plan: General   Post-op Pain Management:    Induction: Intravenous  Airway Management Planned: LMA  Additional Equipment:   Intra-op Plan:   Post-operative Plan: Extubation in OR  Informed Consent: I have reviewed the patients History and Physical, chart, labs and discussed the procedure including the risks, benefits and alternatives for the proposed anesthesia with the patient or authorized representative who has indicated his/her understanding and acceptance.     Plan Discussed with:   Anesthesia Plan Comments:         Anesthesia Quick Evaluation

## 2013-09-06 NOTE — Interval H&P Note (Signed)
History and Physical Interval Note:  09/06/2013 11:30 AM  Kristi Carr  has presented today for surgery, with the diagnosis of breast cancer  The various methods of treatment have been discussed with the patient and family. After consideration of risks, benefits and other options for treatment, the patient has consented to  Procedure(s): LEFT BREAST SEED GUIDED LUMPECTOMY WITH LEFT AXILLARY SENTINEL NODE BIOPSY (Left) as a surgical intervention .  The patient's history has been reviewed, patient examined, no change in status, stable for surgery.  I have reviewed the patient's chart and labs.  Questions were answered to the patient's satisfaction.     Rolm Bookbinder

## 2013-09-06 NOTE — Op Note (Signed)
Preoperative diagnosis: Clinical stage I left breast cancer Postoperative diagnosis: Same as above Procedure: #1 left breast radioactive seed guided lumpectomy #2 left axillary sentinel lymph node biopsy Surgeon: Dr. Serita Grammes Anesthesia: Gen. And pectoral block Specimens: Left breast marked with paint   left axillary sentinel node with count of 692 Drains: None Complications: None Sponge and needle count was correct at completion Disposition to recovery stable  Indications: This is a 68 year old female who presented with a screening mammogram that was abnormal. She had a 1.7 cm left upper outer quadrant breast mass that was biopsied and shown to be invasive mammary carcinoma. We discussed all of her options at our multidisciplinary conference and decided to proceed with breast conservation therapy. She had a radioactive seed placed prior to beginning.  Procedure: After informed consent was obtained the patient was first brought preop. I confirmed a seed was in the left breast with the neoprobe. She was given a pectoral block. She was given cefazolin. sequential compression devices on her legs. She had technetium injected in the standard periareolar fashion. She was  placed under general anesthesia without complication. Her left breast and axilla were then prepped and draped in the standard sterile surgical fashion.  I identified the location of the seed with the neoprobe. I then made a linear incision in the left upper outer quadrant. I used the neoprobe to guide the excision with the seed in the middle of the excised area. This was done in order to gain clear margins. This was then removed. I confirmed the seed was present. This was also confirmed with a mammogram. This was confirmed by radiology. The mass, clip, and seed were in the center of the area. I did have pathology review this grossly and appeared all of my margins were clear grossly and the operating room. I then placed 2 clips  deep. I placed one clip each of the positions around the cavity. I then closed the cavity with 2-0 Vicryl. The dermis was closed with 3-0 Vicryl and the skin with 4-0 Monocryl. Eventually Dermabond and Steri-Strips are placed over the incision.  I then identified the location of an axillary sentinel node. I made a 2 cm incision below the axillary hairline. I then carried this through the axillary fascia. I identified one sentinel node and one smaller node right next to it. These were excised. There was no background radioactivity. Hemostasis was obtained. I then closed the axillary fascia with 2-0 Vicryl. The dermis closed through Vicryl the skin with 4 Monocryl. Steri-Strips and a sterile dressing were placed. A breast binder was placed. She tolerated this well was extubated and transferred to recovery stable.

## 2013-09-06 NOTE — Discharge Instructions (Signed)
[facility] °Office Phone Number [phone] ° °BREAST BIOPSY/ PARTIAL MASTECTOMY: POST OP INSTRUCTIONS ° °Always review your discharge instruction sheet given to you by the facility where your surgery was performed. ° °IF YOU HAVE DISABILITY OR FAMILY LEAVE FORMS, YOU MUST BRING THEM TO THE OFFICE FOR PROCESSING.  DO NOT GIVE THEM TO YOUR DOCTOR. ° °1. A prescription for pain medication may be given to you upon discharge.  Take your pain medication as prescribed, if needed.  If narcotic pain medicine is not needed, then you may take acetaminophen (Tylenol), naprosyn (Alleve) or ibuprofen (Advil) as needed. °2. Take your usually prescribed medications unless otherwise directed °3. If you need a refill on your pain medication, please contact your pharmacy.  They will contact our office to request authorization.  Prescriptions will not be filled after 5pm or on week-ends. °4. You should eat very light the first 24 hours after surgery, such as soup, crackers, pudding, etc.  Resume your normal diet the day after surgery. °5. Most patients will experience some swelling and bruising in the breast.  Ice packs and a good support bra will help.  Wear the breast binder provided or a sports bra for 72 hours day and night.  After that wear a sports bra during the day until you return to the office. Swelling and bruising can take several days to resolve.  °6. It is common to experience some constipation if taking pain medication after surgery.  Increasing fluid intake and taking a stool softener will usually help or prevent this problem from occurring.  A mild laxative (Milk of Magnesia or Miralax) should be taken according to package directions if there are no bowel movements after 48 hours. °7. Unless discharge instructions indicate otherwise, you may remove your bandages 48 hours after surgery and you may shower at that time.  You may have steri-strips (small skin tapes) in place directly over the incision.   These strips should be left on the skin for 7-10 days and will come off on their own.  If your surgeon used skin glue on the incision, you may shower in 24 hours.  The glue will flake off over the next 2-3 weeks.  Any sutures or staples will be removed at the office during your follow-up visit. °8. ACTIVITIES:  You may resume regular daily activities (gradually increasing) beginning the next day.  Wearing a good support bra or sports bra minimizes pain and swelling.  You may have sexual intercourse when it is comfortable. °a. You may drive when you no longer are taking prescription pain medication, you can comfortably wear a seatbelt, and you can safely maneuver your car and apply brakes. °b. RETURN TO WORK:  ______________________________________________________________________________________ °9. You should see your doctor in the office for a follow-up appointment approximately two weeks after your surgery.  Your doctor’s nurse will typically make your follow-up appointment when she calls you with your pathology report.  Expect your pathology report 3-4 business days after your surgery.  You may call to check if you do not hear from us after three days. °10. OTHER INSTRUCTIONS: _______________________________________________________________________________________________ _____________________________________________________________________________________________________________________________________ °_____________________________________________________________________________________________________________________________________ °_____________________________________________________________________________________________________________________________________ ° °WHEN TO CALL DR WAKEFIELD: °1. Fever over 101.0 °2. Nausea and/or vomiting. °3. Extreme swelling or bruising. °4. Continued bleeding from incision. °5. Increased pain, redness, or drainage from the incision. ° °The clinic staff is available to  answer your questions during regular business hours.  Please don’t hesitate to call and ask to speak to one of the nurses for   clinical concerns.  If you have a medical emergency, go to the nearest emergency room or call 911.  A surgeon from [facility] is always on call at the hospital. ° °For further questions, please visit [email or web address] ° ° ° ° °Post Anesthesia Home Care Instructions ° °Activity: °Get plenty of rest for the remainder of the day. A responsible adult should stay with you for 24 hours following the procedure.  °For the next 24 hours, DO NOT: °-Drive a car °-Operate machinery °-Drink alcoholic beverages °-Take any medication unless instructed by your physician °-Make any legal decisions or sign important papers. ° °Meals: °Start with liquid foods such as gelatin or soup. Progress to regular foods as tolerated. Avoid greasy, spicy, heavy foods. If nausea and/or vomiting occur, drink only clear liquids until the nausea and/or vomiting subsides. Call your physician if vomiting continues. ° °Special Instructions/Symptoms: °Your throat may feel dry or sore from the anesthesia or the breathing tube placed in your throat during surgery. If this causes discomfort, gargle with warm salt water. The discomfort should disappear within 24 hours. ° ° °Call your surgeon if you experience:  ° °1.  Fever over 101.0. °2.  Inability to urinate. °3.  Nausea and/or vomiting. °4.  Extreme swelling or bruising at the surgical site. °5.  Continued bleeding from the incision. °6.  Increased pain, redness or drainage from the incision. °7.  Problems related to your pain medication. ° ° ° °

## 2013-09-07 ENCOUNTER — Encounter: Payer: Self-pay | Admitting: Family Medicine

## 2013-09-20 ENCOUNTER — Encounter: Payer: Self-pay | Admitting: Oncology

## 2013-09-20 NOTE — Progress Notes (Unsigned)
Location of Breast Cancer: Breast cancer of upper-outer quadrant of left female breast  Histology per Pathology Report:   08/02/2013 Diagnosis Breast, left, needle core biopsy, mass, UOQ - INVASIVE MAMMARY CARCINOMA WITH PAPILLARY FEATURES, SEE COMMENT.  09/06/2013 Diagnosis 1. Breast, lumpectomy, Left - INVASIVE DUCTAL CARCINOMA WITH PAPILLARY FEATURES (INVASIVE PAPILLARY CARCINOMA), SEE COMMENT. - NEGATIVE FOR LYMPH VASCULAR INVASION. - INVASIVE TUMOR IS 0.1 CM FROM NEAREST MARGIN (POSTERIOR). - DUCTAL CARCINOMA IN SITU PRESENT. - PREVIOUS BIOPSY SITE. - SEE TUMOR SYNOPTIC TEMPLATE BELOW. 2. Lymph node, sentinel, biopsy, Left axillary - ONE LYMPH NODE, NEGATIVE FOR TUMOR (0/1).  Receptor Status: ER(100% positive), PR (100% positive), Her2-neu (negative)  Did patient present with symptoms (if so, please note symptoms) or was this found on screening mammography?: routine screening mammography   Past/Anticipated interventions by surgeon, if any: 09/06/13 LEFT BREAST SEED GUIDED LUMPECTOMY WITH LEFT AXILLARY SENTINEL NODE BIOPSY (Left)  Past/Anticipated interventions by medical oncology, antiestrogen therapy  Lymphedema issues, if any:  no  Pain issues, if any:  Soreness were lymph node biopsy was under her left arm.  SAFETY ISSUES:  Prior radiation? no  Pacemaker/ICD? no  Possible current pregnancy?no  Is the patient on methotrexate? no  Current Complaints / other details: Menarche age 79, first live birth age 54, the patient is [name] P2. She underwent hysterectomy in 1979 but her ovaries are still in place. She did not use hormone replacement.      Kristi Liner, RN 09/20/2013,9:21 AM

## 2013-09-21 ENCOUNTER — Ambulatory Visit (INDEPENDENT_AMBULATORY_CARE_PROVIDER_SITE_OTHER): Payer: Medicare HMO | Admitting: General Surgery

## 2013-09-21 ENCOUNTER — Telehealth (INDEPENDENT_AMBULATORY_CARE_PROVIDER_SITE_OTHER): Payer: Self-pay

## 2013-09-21 ENCOUNTER — Encounter (INDEPENDENT_AMBULATORY_CARE_PROVIDER_SITE_OTHER): Payer: Self-pay | Admitting: General Surgery

## 2013-09-21 VITALS — BP 134/80 | HR 78 | Temp 98.4°F | Ht 65.0 in | Wt 144.0 lb

## 2013-09-21 DIAGNOSIS — Z09 Encounter for follow-up examination after completed treatment for conditions other than malignant neoplasm: Secondary | ICD-10-CM

## 2013-09-21 NOTE — Progress Notes (Signed)
Subjective:     Patient ID: Kristi Carr, female   DOB: 01/02/1946, 67 y.o.   MRN: [id number]  HPI This is a 67 short female who was seen in our multidisciplinary clinic. She since has then undergone to a left breast radioactive seed guided lumpectomy and left axillary sentinel lymph node biopsy. This is a stage I breast cancer. The margins are negative. There is a 1 mm posterior margin. This is a estrogen and progesterone receptor positive. HER-2/neu is not amplified. The proliferation index is 9%. She did well from surgery and returns today without any complaints. She is due to go to the Cancer Center tomorrow.  Review of Systems     Objective:   Physical Exam Healing left breast and left axillary incisions , no infection    Assessment:     Stage I left breast cancer     Plan:     She's doing well from surgery I released to full activity as tolerated. She is due to go to the Cancer Center tomorrow for discussion of adjuvant therapy now placed further followup on.       

## 2013-09-21 NOTE — Telephone Encounter (Signed)
Called to request correcting a error on pathology report dated 09/06/13 [id number]. The correction needs to be made on the KI-67 that reports 90% it should report 9% instead. Tammy will check to see if they can correct it or if [facility] has to do the correction. I advised pt that I would mail her a copy once the report is amended. The pt understands.

## 2013-09-22 ENCOUNTER — Encounter: Payer: Self-pay | Admitting: Radiation Oncology

## 2013-09-22 ENCOUNTER — Ambulatory Visit
Admission: RE | Admit: 2013-09-22 | Discharge: 2013-09-22 | Disposition: A | Discharge status: Home / Self Care | Payer: Medicare HMO | Source: Ambulatory Visit | Attending: Radiation Oncology | Admitting: Radiation Oncology

## 2013-09-22 VITALS — BP 155/84 | HR 59 | Temp 98.0°F | Ht 65.0 in | Wt 144.4 lb

## 2013-09-22 DIAGNOSIS — Y842 Radiological procedure and radiotherapy as the cause of abnormal reaction of the patient, or of later complication, without mention of misadventure at the time of the procedure: Secondary | ICD-10-CM | POA: Diagnosis not present

## 2013-09-22 DIAGNOSIS — Z79899 Other long term (current) drug therapy: Secondary | ICD-10-CM | POA: Insufficient documentation

## 2013-09-22 DIAGNOSIS — C50419 Malignant neoplasm of upper-outer quadrant of unspecified female breast: Secondary | ICD-10-CM | POA: Insufficient documentation

## 2013-09-22 DIAGNOSIS — L589 Radiodermatitis, unspecified: Secondary | ICD-10-CM | POA: Insufficient documentation

## 2013-09-22 DIAGNOSIS — C50412 Malignant neoplasm of upper-outer quadrant of left female breast: Secondary | ICD-10-CM

## 2013-09-22 DIAGNOSIS — Z510 Encounter for antineoplastic radiation therapy: Secondary | ICD-10-CM | POA: Insufficient documentation

## 2013-09-22 HISTORY — DX: Calculus of kidney: N20.0

## 2013-09-22 NOTE — Progress Notes (Signed)
Please see the Nurse Progress Note in the MD Initial Consult Encounter for this patient. 

## 2013-09-22 NOTE — Progress Notes (Signed)
Radiation Oncology         (336) [phone] ________________________________  Name: Kristi Carr MRN: [id number]  Date: 09/22/2013  DOB: 26-Feb-1946  Reevaluation Note  CC: Elsie Stain, MD  Rolm Bookbinder, MD  Diagnosis:   Stage I invasive papillary carcinoma of the left breast (pT1c, pN0, Mx)   Narrative:  The patient returns today for further evaluation. Patient was initially seen in the multidisciplinary breast clinic on 08/11/2013. Since that time the patient has undergone her definitive surgery by Dr. Donne Hazel on April 20. The patient underwent a left breast radioactive seed guided lumpectomy and a left axillary sentinel lymph node biopsy. Pathology from the surgery revealed invasive ductal carcinoma with papillary features (invasive papillary carcinoma. The tumor measured 1.8 cm in greatest dimension. The surgical margins were clear with the closest margin being posterior at 0.1 cm. The patient is done well since her surgery. She is now seen in radiation oncology for further evaluation as part of breast conservation therapy.  Patient denies any pain in the breast at this time. She denies any problems with swelling in her left arm or hand. She's had some mild numbness along the upper arm area since her surgery.                            ALLERGIES:  has No Known Allergies.  Meds: Current Outpatient Prescriptions  Medication Sig Dispense Refill  . Calcium Carbonate-Vitamin D 600-400 MG-UNIT per tablet Take 1 tablet by mouth daily.        . Cholecalciferol (VITAMIN D) 1000 UNITS capsule Take 1,000 Units by mouth daily.        . cholestyramine (QUESTRAN) 4 GM/DOSE powder Take 0.5 packets (2 g total) by mouth 2 (two) times daily as needed (loose stools).  378 g  12  . co-enzyme Q-10 30 MG capsule Take 30 mg by mouth daily.      Marland Kitchen ibuprofen (GNP IBUPROFEN) 200 MG tablet Take 200 mg by mouth as needed.        . magnesium 30 MG tablet Take 30 mg by mouth daily.        . metoprolol  succinate (TOPROL-XL) 25 MG 24 hr tablet 25 mg. TAKE 1 TABLET TWICE A DAY      . multivitamin (THERAGRAN) per tablet Take 1 tablet by mouth daily.        . Omega-3 Fatty Acids (FISH OIL) 1000 MG CPDR Take by mouth daily.         No current facility-administered medications for this encounter.    Physical Findings: The patient is in no acute distress. Patient is alert and oriented.  height is 5\' 5"  (1.651 m) and weight is 144 lb 6.4 oz (65.499 kg). Her temperature is 98 F (36.7 C). Her blood pressure is 155/84 and her pulse is 59. Marland Kitchen  No palpable supraclavicular or axillary adenopathy. The lungs are clear. The heart has regular rhythm and rate. Examination of the left breast feels well healing scar in the upper outer quadrant without signs of drainage or infection. Patient has a small scar in the axillary area from her sentinel node procedure.  Lab Findings: Lab Results  Component Value Date   WBC 4.4 08/11/2013   HGB 11.3* 08/11/2013   HCT 34.5* 08/11/2013   MCV 88.7 08/11/2013   PLT 246 08/11/2013      Radiographic Findings: Nm Sentinel Node Inj-no Rpt (breast)  09/06/2013   CLINICAL DATA:  left axillary snbx   Sulfur colloid was injected intradermally by the nuclear medicine  technologist for breast cancer sentinel node localization.     Impression:  Stage I invasive papillary carcinoma of the left breast (pT1c, pN0, Mx).  The patient would be a good candidate for breast conservation with radiation therapy. In light of the patient's large breast size she will be setup in the prone position to limit skin reaction. I discussed the treatment course side effects and potential toxicities of radiation therapy in this situation with the patient. She appears to understand and wishes to proceed with planned course of treatment.  Plan:  Simulation and planning approximately 4 weeks postop with treatments to begin 5 weeks postop.  ____________________________________ Blair Promise, MD

## 2013-09-29 NOTE — Telephone Encounter (Signed)
Mailed amended pathology report to pt today.

## 2013-10-05 ENCOUNTER — Ambulatory Visit
Admission: RE | Admit: 2013-10-05 | Discharge: 2013-10-05 | Disposition: A | Discharge status: Home / Self Care | Payer: Medicare HMO | Source: Ambulatory Visit | Attending: Radiation Oncology | Admitting: Radiation Oncology

## 2013-10-05 DIAGNOSIS — C50412 Malignant neoplasm of upper-outer quadrant of left female breast: Secondary | ICD-10-CM

## 2013-10-05 DIAGNOSIS — Z510 Encounter for antineoplastic radiation therapy: Secondary | ICD-10-CM | POA: Diagnosis not present

## 2013-10-07 NOTE — Progress Notes (Signed)
  Radiation Oncology         (336) [phone] ________________________________  Name: Kristi Carr MRN: [id number]  Date: 10/05/2013  DOB: 01-03-1946  SIMULATION AND TREATMENT PLANNING NOTE  DIAGNOSIS:  Stage I invasive papillary carcinoma of the left breast (pT1c, pN0, Mx)    NARRATIVE:  The patient was brought to the [facility].  Identity was confirmed.  All relevant records and images related to the planned course of therapy were reviewed.  The patient freely provided informed written consent to proceed with treatment after reviewing the details related to the planned course of therapy. The consent form was witnessed and verified by the simulation staff.  Then, the patient was set-up in a stable reproducible  supine position for radiation therapy.  CT images were obtained.  Surface markings were placed.  The CT images were loaded into the planning software.  Then the target and avoidance structures were contoured.  Treatment planning then occurred.  The radiation prescription was entered and confirmed.  Then, I designed and supervised the construction of a total of 3 medically necessary complex treatment devices.  I have requested : 3D Simulation  I have requested a DVH of the following structures: heart, lungs, lumpectomy cavity.  I have ordered:dose calc.  PLAN:  The patient will receive 50.4 Gy in 28 fractions followed by a boost to the lumpectomy cavity for a cumulative dose of 63 gray.  ________________________________  -----------------------------------  Blair Promise, PhD, MD

## 2013-10-07 NOTE — Progress Notes (Signed)
  Radiation Oncology         [phone] ________________________________  Name: Kristi Carr MRN: [id number]  Date: 10/05/2013  DOB: 22-Apr-1946  Optical Surface Tracking Plan:  Since intensity modulated radiotherapy (IMRT) and 3D conformal radiation treatment methods are predicated on accurate and precise positioning for treatment, intrafraction motion monitoring is medically necessary to ensure accurate and safe treatment delivery.  The ability to quantify intrafraction motion without excessive ionizing radiation dose can only be performed with optical surface tracking. Accordingly, surface imaging offers the opportunity to obtain 3D measurements of patient position throughout IMRT and 3D treatments without excessive radiation exposure.  I am ordering optical surface tracking for this patient's upcoming course of radiotherapy. ________________________________  Blair Promise, MD    Reference:   Particia Jasper, et al. Surface imaging-based analysis of intrafraction motion for breast radiotherapy patients.Journal of [place], n. 6, nov. 2014. ISSN [id number].   Available at: <http://www.jacmp.org/index.php/jacmp/article/view/4957>.

## 2013-10-12 DIAGNOSIS — Z510 Encounter for antineoplastic radiation therapy: Secondary | ICD-10-CM | POA: Diagnosis not present

## 2013-10-13 ENCOUNTER — Ambulatory Visit
Admission: RE | Admit: 2013-10-13 | Discharge: 2013-10-13 | Disposition: A | Discharge status: Home / Self Care | Payer: Medicare HMO | Source: Ambulatory Visit | Attending: Radiation Oncology | Admitting: Radiation Oncology

## 2013-10-13 DIAGNOSIS — C50412 Malignant neoplasm of upper-outer quadrant of left female breast: Secondary | ICD-10-CM

## 2013-10-13 DIAGNOSIS — Z510 Encounter for antineoplastic radiation therapy: Secondary | ICD-10-CM | POA: Diagnosis not present

## 2013-10-13 MED ORDER — ALRA NON-METALLIC DEODORANT (RAD-ONC)
1.0000 "application " | Freq: Once | TOPICAL | Status: AC
  Administered 2013-10-13: 1 via TOPICAL

## 2013-10-13 MED ORDER — RADIAPLEXRX EX GEL
Freq: Once | CUTANEOUS | Status: AC
Start: 2013-10-13 — End: 2013-10-13
  Administered 2013-10-13: 11:00:00 via TOPICAL

## 2013-10-13 NOTE — Progress Notes (Signed)
  Radiation Oncology         [phone] ________________________________  Name: Kristi Carr MRN: [id number]  Date: 10/13/2013  DOB: [birth year]  Simulation Verification Note  Status: outpatient  NARRATIVE: The patient was brought to the treatment unit and placed in the planned treatment position. The clinical setup was verified. Then port films were obtained and uploaded to the radiation oncology medical record software.  The treatment beams were carefully compared against the planned radiation fields. The position location and shape of the radiation fields was reviewed. They targeted volume of tissue appears to be appropriately covered by the radiation beams. Organs at risk appear to be excluded as planned.  Based on my personal review, I approved the simulation verification. The patient's treatment will proceed as planned.  -----------------------------------  Blair Promise, PhD, MD

## 2013-10-13 NOTE — Progress Notes (Signed)
Kristi Carr was given the Radiation Therapy and You book and discussed potential side effects management of skin changes and fatigue.  She was given alra Deoderant and radiaplex gel.  She was instructed to apply the radiaplex gel twice a day after treatment and at bedtime.  She was educated about under treat day with Dr. Sondra Come on Tuesday's.  She was given my business card and was instructed to call with any questions or concerns.

## 2013-10-14 ENCOUNTER — Telehealth: Payer: Self-pay | Admitting: *Deleted

## 2013-10-14 ENCOUNTER — Ambulatory Visit
Admission: RE | Admit: 2013-10-14 | Discharge: 2013-10-14 | Disposition: A | Discharge status: Home / Self Care | Payer: Medicare HMO | Source: Ambulatory Visit | Attending: Radiation Oncology | Admitting: Radiation Oncology

## 2013-10-14 DIAGNOSIS — Z510 Encounter for antineoplastic radiation therapy: Secondary | ICD-10-CM | POA: Diagnosis not present

## 2013-10-14 NOTE — Telephone Encounter (Signed)
Called pt and informed her that we did not need to see her until after radiation treatment was completed.  Confirmed 12/07/13 appt w/ pt.

## 2013-10-15 ENCOUNTER — Ambulatory Visit
Admission: RE | Admit: 2013-10-15 | Discharge: 2013-10-15 | Disposition: A | Discharge status: Home / Self Care | Payer: Medicare HMO | Source: Ambulatory Visit | Attending: Radiation Oncology | Admitting: Radiation Oncology

## 2013-10-15 DIAGNOSIS — Z510 Encounter for antineoplastic radiation therapy: Secondary | ICD-10-CM | POA: Diagnosis not present

## 2013-10-18 ENCOUNTER — Ambulatory Visit
Admission: RE | Admit: 2013-10-18 | Discharge: 2013-10-18 | Disposition: A | Discharge status: Home / Self Care | Payer: Medicare HMO | Source: Ambulatory Visit | Attending: Radiation Oncology | Admitting: Radiation Oncology

## 2013-10-18 DIAGNOSIS — Z510 Encounter for antineoplastic radiation therapy: Secondary | ICD-10-CM | POA: Diagnosis not present

## 2013-10-19 ENCOUNTER — Ambulatory Visit
Admission: RE | Admit: 2013-10-19 | Discharge: 2013-10-19 | Disposition: A | Discharge status: Home / Self Care | Payer: Medicare HMO | Source: Ambulatory Visit | Attending: Radiation Oncology | Admitting: Radiation Oncology

## 2013-10-19 VITALS — BP 154/88 | HR 63 | Temp 97.7°F | Resp 16 | Ht 65.0 in | Wt 147.8 lb

## 2013-10-19 DIAGNOSIS — C50412 Malignant neoplasm of upper-outer quadrant of left female breast: Secondary | ICD-10-CM

## 2013-10-19 DIAGNOSIS — Z510 Encounter for antineoplastic radiation therapy: Secondary | ICD-10-CM | POA: Diagnosis not present

## 2013-10-19 NOTE — Progress Notes (Signed)
  Radiation Oncology         [phone] ________________________________  Name: Kristi Carr MRN: [id number]  Date: 10/19/2013  DOB: 1946/04/03  Weekly Radiation Therapy Management  Current Dose: 7.2 Gy     Planned Dose:  63 Gy  Narrative . . . . . . . . The patient presents for routine under treatment assessment.                                   The patient is without complaint.                                 Set-up films were reviewed.                                 The chart was checked. Physical Findings. . .  height is 5\' 5"  (1.651 m) and weight is 147 lb 12.8 oz (67.042 kg). Her oral temperature is 97.7 F (36.5 C). Her blood pressure is 154/88 and her pulse is 63. Her respiration is 16. . The lungs are clear. The heart has a regular rhythm and rate. Examination of the left breast reveals some mild to moderate erythema particularly in the central aspect of the breast. No signs of infection Impression . . . . . . . The patient is tolerating radiation. Plan . . . . . . . . . . . . Continue treatment as planned.  ________________________________   Blair Promise, PhD, MD

## 2013-10-19 NOTE — Progress Notes (Signed)
Kristi Carr has had 4 fractions to her left breast.  She denies pain except for occasional sharp, twinges of pain.  She denies fatigue.  Her left breast is pink.  She says the color fades a bit in the afternoons.  She is using radiaplex gel.

## 2013-10-20 ENCOUNTER — Ambulatory Visit: Payer: Medicare HMO | Admitting: Oncology

## 2013-10-20 ENCOUNTER — Other Ambulatory Visit: Payer: Medicare HMO

## 2013-10-20 ENCOUNTER — Ambulatory Visit
Admission: RE | Admit: 2013-10-20 | Discharge: 2013-10-20 | Disposition: A | Discharge status: Home / Self Care | Payer: Medicare HMO | Source: Ambulatory Visit | Attending: Radiation Oncology | Admitting: Radiation Oncology

## 2013-10-20 DIAGNOSIS — Z510 Encounter for antineoplastic radiation therapy: Secondary | ICD-10-CM | POA: Diagnosis not present

## 2013-10-21 ENCOUNTER — Ambulatory Visit
Admission: RE | Admit: 2013-10-21 | Discharge: 2013-10-21 | Disposition: A | Discharge status: Home / Self Care | Payer: Medicare HMO | Source: Ambulatory Visit | Attending: Radiation Oncology | Admitting: Radiation Oncology

## 2013-10-21 DIAGNOSIS — Z510 Encounter for antineoplastic radiation therapy: Secondary | ICD-10-CM | POA: Diagnosis not present

## 2013-10-22 ENCOUNTER — Ambulatory Visit
Admission: RE | Admit: 2013-10-22 | Discharge: 2013-10-22 | Disposition: A | Discharge status: Home / Self Care | Payer: Medicare HMO | Source: Ambulatory Visit | Attending: Radiation Oncology | Admitting: Radiation Oncology

## 2013-10-22 DIAGNOSIS — Z510 Encounter for antineoplastic radiation therapy: Secondary | ICD-10-CM | POA: Diagnosis not present

## 2013-10-25 ENCOUNTER — Ambulatory Visit
Admission: RE | Admit: 2013-10-25 | Discharge: 2013-10-25 | Disposition: A | Discharge status: Home / Self Care | Payer: Medicare HMO | Source: Ambulatory Visit | Attending: Radiation Oncology | Admitting: Radiation Oncology

## 2013-10-25 DIAGNOSIS — Z510 Encounter for antineoplastic radiation therapy: Secondary | ICD-10-CM | POA: Diagnosis not present

## 2013-10-26 ENCOUNTER — Ambulatory Visit
Admission: RE | Admit: 2013-10-26 | Discharge: 2013-10-26 | Disposition: A | Discharge status: Home / Self Care | Payer: Medicare HMO | Source: Ambulatory Visit | Attending: Radiation Oncology | Admitting: Radiation Oncology

## 2013-10-26 ENCOUNTER — Encounter: Payer: Self-pay | Admitting: Radiation Oncology

## 2013-10-26 VITALS — BP 171/84 | HR 55 | Temp 98.2°F | Resp 20 | Wt 146.4 lb

## 2013-10-26 DIAGNOSIS — Z510 Encounter for antineoplastic radiation therapy: Secondary | ICD-10-CM | POA: Diagnosis not present

## 2013-10-26 DIAGNOSIS — C50412 Malignant neoplasm of upper-outer quadrant of left female breast: Secondary | ICD-10-CM

## 2013-10-26 NOTE — Progress Notes (Signed)
Weekly rad txs, 9 completed left breast , erythema, skin intact,uses radiaplex gel bid, has occasional stinging in breast, nipple area real tender, appetite good, no fatigue, exercising, gardening, and walking 11:15 AM

## 2013-10-26 NOTE — Progress Notes (Signed)
  Radiation Oncology         [phone] ________________________________  Name: Kristi Carr MRN: [id number]  Date: 10/26/2013  DOB: [birth year]  Weekly Radiation Therapy Management  Breast cancer of upper-outer quadrant of left female breast   Primary site: Breast (Left)   Staging method: AJCC 7th Edition   Clinical: Stage IIA (T2, N0, cM0)   Summary: Stage IIA (T2, N0, cM0)   Clinical comments: Staged at breast conference 08/11/13.  Current Dose: 16.2 Gy     Planned Dose:  63 Gy  Narrative . . . . . . . . The patient presents for routine under treatment assessment.                                   The patient is without complaint except for some soreness along the nipple and lumpectomy scar                                 Set-up films were reviewed.                                 The chart was checked. Physical Findings. . .  weight is 146 lb 6.4 oz (66.407 kg). Her oral temperature is 98.2 F (36.8 C). Her blood pressure is 171/84 and her pulse is 55. Her respiration is 20. . Mild erythema is noted in the central aspect of the breast. No signs of infection Impression . . . . . . . The patient is tolerating radiation. Plan . . . . . . . . . . . . Continue treatment as planned.  ________________________________   Blair Promise, PhD, MD

## 2013-10-27 ENCOUNTER — Ambulatory Visit
Admission: RE | Admit: 2013-10-27 | Discharge: 2013-10-27 | Disposition: A | Discharge status: Home / Self Care | Payer: Medicare HMO | Source: Ambulatory Visit | Attending: Radiation Oncology | Admitting: Radiation Oncology

## 2013-10-27 DIAGNOSIS — Z510 Encounter for antineoplastic radiation therapy: Secondary | ICD-10-CM | POA: Diagnosis not present

## 2013-10-28 ENCOUNTER — Ambulatory Visit
Admission: RE | Admit: 2013-10-28 | Discharge: 2013-10-28 | Disposition: A | Discharge status: Home / Self Care | Payer: Medicare HMO | Source: Ambulatory Visit | Attending: Radiation Oncology | Admitting: Radiation Oncology

## 2013-10-28 DIAGNOSIS — Z510 Encounter for antineoplastic radiation therapy: Secondary | ICD-10-CM | POA: Diagnosis not present

## 2013-10-29 ENCOUNTER — Ambulatory Visit
Admission: RE | Admit: 2013-10-29 | Discharge: 2013-10-29 | Disposition: A | Discharge status: Home / Self Care | Payer: Medicare HMO | Source: Ambulatory Visit | Attending: Radiation Oncology | Admitting: Radiation Oncology

## 2013-10-29 DIAGNOSIS — Z510 Encounter for antineoplastic radiation therapy: Secondary | ICD-10-CM | POA: Diagnosis not present

## 2013-11-01 ENCOUNTER — Ambulatory Visit
Admission: RE | Admit: 2013-11-01 | Discharge: 2013-11-01 | Disposition: A | Discharge status: Home / Self Care | Payer: Medicare HMO | Source: Ambulatory Visit | Attending: Radiation Oncology | Admitting: Radiation Oncology

## 2013-11-01 DIAGNOSIS — Z510 Encounter for antineoplastic radiation therapy: Secondary | ICD-10-CM | POA: Diagnosis not present

## 2013-11-02 ENCOUNTER — Ambulatory Visit
Admission: RE | Admit: 2013-11-02 | Discharge: 2013-11-02 | Disposition: A | Discharge status: Home / Self Care | Payer: Medicare HMO | Source: Ambulatory Visit | Attending: Radiation Oncology | Admitting: Radiation Oncology

## 2013-11-02 VITALS — BP 148/71 | HR 60 | Temp 97.9°F | Resp 12 | Ht 65.0 in | Wt 144.0 lb

## 2013-11-02 DIAGNOSIS — Z510 Encounter for antineoplastic radiation therapy: Secondary | ICD-10-CM | POA: Diagnosis not present

## 2013-11-02 DIAGNOSIS — C50412 Malignant neoplasm of upper-outer quadrant of left female breast: Secondary | ICD-10-CM

## 2013-11-02 MED ORDER — RADIAPLEXRX EX GEL
Freq: Once | CUTANEOUS | Status: AC
  Administered 2013-11-02: 14:00:00 via TOPICAL

## 2013-11-02 NOTE — Progress Notes (Signed)
  Radiation Oncology         [phone] ________________________________  Name: Kristi Carr MRN: [id number]  Date: 11/02/2013  DOB: Jan 31, 1946  Weekly Radiation Therapy Management  Breast cancer of upper-outer quadrant of left female breast   Primary site: Breast (Left)   Staging method: AJCC 7th Edition   Clinical: Stage IIA (T2, N0, cM0)   Summary: Stage IIA (T2, N0, cM0)   Clinical comments: Staged at breast conference 08/11/13.  Current Dose: 25.2 Gy     Planned Dose:  63 Gy  Narrative . . . . . . . . The patient presents for routine under treatment assessment.                                   The patient is without complaint except for occasional twinges of pain in the left breast. She denies any fatigue or itching at this time                                 Set-up films were reviewed.                                 The chart was checked. Physical Findings. . .  height is 5\' 5"  (1.651 m) and weight is 144 lb (65.318 kg). Her oral temperature is 97.9 F (36.6 C). Her blood pressure is 148/71 and her pulse is 60. Her respiration is 12. . The left breast area shows erythema in the central aspect of the breast as well as some swelling. No skin breakdown is appreciated. Mild radiation dermatitis in the upper-inner quadrant. Impression . . . . . . . The patient is tolerating radiation. Plan . . . . . . . . . . . . Continue treatment as planned.  ________________________________   Blair Promise, PhD, MD

## 2013-11-02 NOTE — Progress Notes (Signed)
Kristi Carr has had 14 fractions to her left breast.  She reports occasional "hot twinges" of pain in her left breast.  She has noticed swelling in her left breast this week.  She denies fatigue.  The skin on her left breast is red.  She has 3 raised, red, bumps on her upper left breast.  She denies any itching.  She is using radiaplex and requested a refill today.  Another tube has been given.

## 2013-11-02 NOTE — Addendum Note (Signed)
Encounter addended by: Jacqulyn Liner, RN on: 11/02/2013  1:55 PM<BR>     Documentation filed: Inpatient MAR, Orders

## 2013-11-03 ENCOUNTER — Ambulatory Visit
Admission: RE | Admit: 2013-11-03 | Discharge: 2013-11-03 | Disposition: A | Discharge status: Home / Self Care | Payer: Medicare HMO | Source: Ambulatory Visit | Attending: Radiation Oncology | Admitting: Radiation Oncology

## 2013-11-03 DIAGNOSIS — Z510 Encounter for antineoplastic radiation therapy: Secondary | ICD-10-CM | POA: Diagnosis not present

## 2013-11-04 ENCOUNTER — Ambulatory Visit
Admission: RE | Admit: 2013-11-04 | Discharge: 2013-11-04 | Disposition: A | Discharge status: Home / Self Care | Payer: Medicare HMO | Source: Ambulatory Visit | Attending: Radiation Oncology | Admitting: Radiation Oncology

## 2013-11-04 DIAGNOSIS — Z510 Encounter for antineoplastic radiation therapy: Secondary | ICD-10-CM | POA: Diagnosis not present

## 2013-11-05 ENCOUNTER — Ambulatory Visit
Admission: RE | Admit: 2013-11-05 | Discharge: 2013-11-05 | Disposition: A | Discharge status: Home / Self Care | Payer: Medicare HMO | Source: Ambulatory Visit | Attending: Radiation Oncology | Admitting: Radiation Oncology

## 2013-11-05 DIAGNOSIS — Z510 Encounter for antineoplastic radiation therapy: Secondary | ICD-10-CM | POA: Diagnosis not present

## 2013-11-08 ENCOUNTER — Ambulatory Visit
Admission: RE | Admit: 2013-11-08 | Discharge: 2013-11-08 | Disposition: A | Discharge status: Home / Self Care | Payer: Medicare HMO | Source: Ambulatory Visit | Attending: Radiation Oncology | Admitting: Radiation Oncology

## 2013-11-08 DIAGNOSIS — Z510 Encounter for antineoplastic radiation therapy: Secondary | ICD-10-CM | POA: Diagnosis not present

## 2013-11-09 ENCOUNTER — Ambulatory Visit
Admission: RE | Admit: 2013-11-09 | Discharge: 2013-11-09 | Disposition: A | Discharge status: Home / Self Care | Payer: Medicare HMO | Source: Ambulatory Visit | Attending: Radiation Oncology | Admitting: Radiation Oncology

## 2013-11-09 ENCOUNTER — Encounter: Payer: Self-pay | Admitting: Radiation Oncology

## 2013-11-09 VITALS — BP 154/79 | HR 58 | Temp 97.6°F | Ht 65.0 in | Wt 145.3 lb

## 2013-11-09 DIAGNOSIS — C50412 Malignant neoplasm of upper-outer quadrant of left female breast: Secondary | ICD-10-CM

## 2013-11-09 DIAGNOSIS — Z510 Encounter for antineoplastic radiation therapy: Secondary | ICD-10-CM | POA: Diagnosis not present

## 2013-11-09 NOTE — Progress Notes (Signed)
Weekly Management Note Current Dose: 34.2  Gy  Projected Dose: 60.4 Gy   Narrative:  The patient presents for routine under treatment assessment.  CBCT/MVCT images/Port film x-rays were reviewed.  The chart was checked. Doing well. Occasional sharp pains. Rash medially in the breast  Physical Findings: Weight: 145 lb 4.8 oz (65.908 kg). Slight dermatitis medially. Pink over nipple   Impression:  The patient is tolerating radiation.  Plan:  Continue treatment as planned. Add hydrocortisone to medial breast if needed.

## 2013-11-09 NOTE — Progress Notes (Signed)
Kristi Carr has completed 19/28 fractions to her left breast.  She denies pain but has occasional sharp pains in her left breast.  She denies fatigue.  The skin on her left breast and left underarm is red.  She has a raised rash on the upper portion on her left breast.  She is using radiaplex three times a day.

## 2013-11-10 ENCOUNTER — Ambulatory Visit
Admission: RE | Admit: 2013-11-10 | Discharge: 2013-11-10 | Disposition: A | Discharge status: Home / Self Care | Payer: Medicare HMO | Source: Ambulatory Visit | Attending: Radiation Oncology | Admitting: Radiation Oncology

## 2013-11-10 DIAGNOSIS — Z510 Encounter for antineoplastic radiation therapy: Secondary | ICD-10-CM | POA: Diagnosis not present

## 2013-11-11 ENCOUNTER — Ambulatory Visit
Admission: RE | Admit: 2013-11-11 | Discharge: 2013-11-11 | Disposition: A | Discharge status: Home / Self Care | Payer: Medicare HMO | Source: Ambulatory Visit | Attending: Radiation Oncology | Admitting: Radiation Oncology

## 2013-11-11 DIAGNOSIS — Z510 Encounter for antineoplastic radiation therapy: Secondary | ICD-10-CM | POA: Diagnosis not present

## 2013-11-12 ENCOUNTER — Ambulatory Visit
Admission: RE | Admit: 2013-11-12 | Discharge: 2013-11-12 | Disposition: A | Discharge status: Home / Self Care | Payer: Medicare HMO | Source: Ambulatory Visit | Attending: Radiation Oncology | Admitting: Radiation Oncology

## 2013-11-12 DIAGNOSIS — Z510 Encounter for antineoplastic radiation therapy: Secondary | ICD-10-CM | POA: Diagnosis not present

## 2013-11-15 ENCOUNTER — Ambulatory Visit
Admission: RE | Admit: 2013-11-15 | Discharge: 2013-11-15 | Disposition: A | Discharge status: Home / Self Care | Payer: Medicare HMO | Source: Ambulatory Visit | Attending: Radiation Oncology | Admitting: Radiation Oncology

## 2013-11-15 DIAGNOSIS — Z510 Encounter for antineoplastic radiation therapy: Secondary | ICD-10-CM | POA: Diagnosis not present

## 2013-11-16 ENCOUNTER — Ambulatory Visit
Admission: RE | Admit: 2013-11-16 | Discharge: 2013-11-16 | Disposition: A | Discharge status: Home / Self Care | Payer: Medicare HMO | Source: Ambulatory Visit | Attending: Radiation Oncology | Admitting: Radiation Oncology

## 2013-11-16 ENCOUNTER — Encounter: Payer: Self-pay | Admitting: Radiation Oncology

## 2013-11-16 ENCOUNTER — Ambulatory Visit: Admission: RE | Admit: 2013-11-16 | Payer: Medicare HMO | Source: Ambulatory Visit | Admitting: Radiation Oncology

## 2013-11-16 VITALS — BP 154/88 | HR 61 | Temp 97.9°F | Ht 65.0 in | Wt 144.5 lb

## 2013-11-16 DIAGNOSIS — Z510 Encounter for antineoplastic radiation therapy: Secondary | ICD-10-CM | POA: Diagnosis not present

## 2013-11-16 DIAGNOSIS — C50412 Malignant neoplasm of upper-outer quadrant of left female breast: Secondary | ICD-10-CM

## 2013-11-16 NOTE — Progress Notes (Addendum)
Kristi Carr has completed 24 fractions to her left breast.  She reports occasional sharp, burning feelings in her left breast that she rates at a 3/10.  She denies fatigue.  The skin on her left breast is red.  She has some small peeling areas underneath her breast.  She is using radiaplex gel and used baby powder yesterday underneath her breast.  Patient was given hydrogel pads and was instructed how to use them.

## 2013-11-16 NOTE — Progress Notes (Signed)
  Radiation Oncology         ([phone]) [phone] ________________________________  Name: Kristi Carr MRN: [id number]  Date: 11/16/2013  DOB: 1945/10/24  Weekly Radiation Therapy Management  Breast cancer of upper-outer quadrant of left female breast   Primary site: Breast (Left)   Staging method: AJCC 7th Edition   Clinical: Stage IIA (T2, N0, cM0)   Summary: Stage IIA (T2, N0, cM0)   Clinical comments: Staged at breast conference 08/11/13.   Current Dose: 43.2 Gy     Planned Dose:  63 Gy  Narrative . . . . . . . . The patient presents for routine under treatment assessment.                                   The patient is without complaint except for occasional sharp pains within the breast. She has noticed some mild itching as well as some peeling along the medial aspect of her inframammary fold. She will be given a hydrogel dressings to place on this area                                 Set-up films were reviewed.                                 The chart was checked. Physical Findings. . .  height is 5\' 5"  (1.651 m) and weight is 144 lb 8 oz (65.545 kg). Her oral temperature is 97.9 F (36.6 C). Her blood pressure is 154/88 and her pulse is 61. . Weight essentially stable.   Brisk erythema throughout the left breast with dry desquamation in the lower inner aspect.. No moist desquamation Impression . . . . . . . The patient is tolerating radiation. Plan . . . . . . . . . . . . Continue treatment as planned.  ________________________________   Blair Promise, PhD, MD

## 2013-11-17 ENCOUNTER — Ambulatory Visit
Admission: RE | Admit: 2013-11-17 | Discharge: 2013-11-17 | Disposition: A | Discharge status: Home / Self Care | Payer: Medicare HMO | Source: Ambulatory Visit | Attending: Radiation Oncology | Admitting: Radiation Oncology

## 2013-11-17 DIAGNOSIS — Z510 Encounter for antineoplastic radiation therapy: Secondary | ICD-10-CM | POA: Diagnosis not present

## 2013-11-18 ENCOUNTER — Ambulatory Visit
Admission: RE | Admit: 2013-11-18 | Discharge: 2013-11-18 | Disposition: A | Discharge status: Home / Self Care | Payer: Medicare HMO | Source: Ambulatory Visit | Attending: Radiation Oncology | Admitting: Radiation Oncology

## 2013-11-18 DIAGNOSIS — Z510 Encounter for antineoplastic radiation therapy: Secondary | ICD-10-CM | POA: Diagnosis not present

## 2013-11-22 ENCOUNTER — Ambulatory Visit
Admission: RE | Admit: 2013-11-22 | Discharge: 2013-11-22 | Disposition: A | Discharge status: Home / Self Care | Payer: Medicare HMO | Source: Ambulatory Visit | Attending: Radiation Oncology | Admitting: Radiation Oncology

## 2013-11-22 VITALS — BP 168/86 | HR 60 | Temp 97.8°F | Resp 12 | Wt 144.8 lb

## 2013-11-22 DIAGNOSIS — Z510 Encounter for antineoplastic radiation therapy: Secondary | ICD-10-CM | POA: Diagnosis not present

## 2013-11-22 DIAGNOSIS — C50412 Malignant neoplasm of upper-outer quadrant of left female breast: Secondary | ICD-10-CM

## 2013-11-22 NOTE — Progress Notes (Signed)
   Department of Radiation Oncology  Phone:  [phone] Fax:        [phone]  Weekly Treatment Note    Name: Kristi Carr Date: 11/22/2013 MRN: [id number] DOB: [birth year]   Current dose: 48.6 Gy  Current fraction: 27   MEDICATIONS: Current Outpatient Prescriptions  Medication Sig Dispense Refill  . Calcium Carbonate-Vitamin D 600-400 MG-UNIT per tablet Take 1 tablet by mouth daily.        . Cholecalciferol (VITAMIN D) 1000 UNITS capsule Take 1,000 Units by mouth daily.        . cholestyramine (QUESTRAN) 4 GM/DOSE powder Take 0.5 packets (2 g total) by mouth 2 (two) times daily as needed (loose stools).  378 g  12  . co-enzyme Q-10 30 MG capsule Take 30 mg by mouth daily.      . hyaluronate sodium (RADIAPLEXRX) GEL Apply 1 application topically 2 (two) times daily.      Marland Kitchen ibuprofen (GNP IBUPROFEN) 200 MG tablet Take 200 mg by mouth as needed.        . magnesium 30 MG tablet Take 30 mg by mouth daily.        . metoprolol succinate (TOPROL-XL) 25 MG 24 hr tablet 25 mg. TAKE 1 TABLET TWICE A DAY      . multivitamin (THERAGRAN) per tablet Take 1 tablet by mouth daily.        . non-metallic deodorant Jethro Poling) MISC Apply 1 application topically daily as needed.      . Omega-3 Fatty Acids (FISH OIL) 1000 MG CPDR Take by mouth daily.         No current facility-administered medications for this encounter.     ALLERGIES: Review of patient's allergies indicates no known allergies.   LABORATORY DATA:  Lab Results  Component Value Date   WBC 4.4 08/11/2013   HGB 11.3* 08/11/2013   HCT 34.5* 08/11/2013   MCV 88.7 08/11/2013   PLT 246 08/11/2013   Lab Results  Component Value Date   NA 145 08/11/2013   K 4.1 08/11/2013   CL 106 10/27/2012   CO2 28 08/11/2013   Lab Results  Component Value Date   ALT 11 08/11/2013   AST 14 08/11/2013   ALKPHOS 74 08/11/2013   BILITOT 0.91 08/11/2013     NARRATIVE: Kristi Carr was seen today for weekly treatment management. The chart was checked  and the patient's films were reviewed. The patient is seen today with some increased skin irritation, especially underneath the left breast. She is notice more irritation but overall otherwise is doing well.  PHYSICAL EXAMINATION: weight is 144 lb 12.8 oz (65.681 kg). Her oral temperature is 97.8 F (36.6 C). Her blood pressure is 168/86 and her pulse is 60. Her respiration is 12.      significant dry desquamation is emerging in the inframammary region. None of the axillary region. No moist desquamation. The surgical site looks good.  ASSESSMENT: The patient is doing satisfactorily with treatment.  PLAN: We will continue with the patient's radiation treatment as planned. The patient will continue to use her current skin cream and I also recommended Neosporin with pain relief. The patient has one more fraction and then will begin her boost treatment which should significantly help given the location of the boost site.

## 2013-11-22 NOTE — Progress Notes (Signed)
She rates her pain as a 7 on a scale of 0-10. Pt complains of, Pain -Burning and Pain Occurs-Intermittently under left breast.  Pt left breast fold positive for breast tenderness, pruritus and rash, desquamation. Pt reports applying Radiaplex as directed.

## 2013-11-23 ENCOUNTER — Ambulatory Visit
Admission: RE | Admit: 2013-11-23 | Discharge: 2013-11-23 | Disposition: A | Discharge status: Home / Self Care | Payer: Medicare HMO | Source: Ambulatory Visit | Attending: Radiation Oncology | Admitting: Radiation Oncology

## 2013-11-23 ENCOUNTER — Encounter: Payer: Self-pay | Admitting: Radiation Oncology

## 2013-11-23 VITALS — BP 166/85 | HR 59 | Temp 97.6°F | Ht 65.0 in | Wt 145.0 lb

## 2013-11-23 DIAGNOSIS — C50412 Malignant neoplasm of upper-outer quadrant of left female breast: Secondary | ICD-10-CM

## 2013-11-23 DIAGNOSIS — Z510 Encounter for antineoplastic radiation therapy: Secondary | ICD-10-CM | POA: Diagnosis not present

## 2013-11-23 NOTE — Progress Notes (Signed)
Kristi Carr has received 28 fractions to her left breast.  Note Bright Erythema of the left breast and lower axilla with moist desquamation of her inframmary fold and note swelling as well..  Instructed per Dr. Lisbeth Renshaw to apply neosporin with pain relief to this area.  Given Gel pads to apply as well.   Grades discomfort as a level 3/10, but states over the weekend pain was at a level 10.  Denies fatigue and reports having a good appetite.

## 2013-11-23 NOTE — Progress Notes (Signed)
  Radiation Oncology         [phone] ________________________________  Name: Kristi Carr MRN: [id number]  Date: 11/23/2013  DOB: [birth year]  Weekly Radiation Therapy Management  Breast cancer of upper-outer quadrant of left female breast   Primary site: Breast (Left)   Staging method: AJCC 7th Edition   Clinical: Stage IIA (T2, N0, cM0)   Summary: Stage IIA (T2, N0, cM0)   Clinical comments: Staged at breast conference 08/11/13.   Current Dose: 50. Gy     Planned Dose:  63 Gy  Narrative . . . . . . . . The patient presents for routine under treatment assessment.                                   The patient continues to have a lot of discomfort in the left breast but better compared to the weekend. She did take Aleve yesterday which was helpful. She also obtained Neosporin plus which is been helpful.                                 Set-up films were reviewed.                                 The chart was checked. Physical Findings. . .  height is 5\' 5"  (1.651 m) and weight is 145 lb (65.772 kg). Her temperature is 97.6 F (36.4 C). Her blood pressure is 166/85 and her pulse is 59. Marland Kitchen Diffuse erythema throughout the breast. In the inframammary fold area the patient does have some skin breakdown. Impression . . . . . . . The patient is tolerating radiation. Plan . . . . . . . . . . . . Continue treatment as planned. Neosporin and hydrogel dressings.  She will start her boost field tomorrow which will be away from the most significant skin reaction.  ________________________________   Blair Promise, PhD, MD

## 2013-11-24 ENCOUNTER — Ambulatory Visit
Admission: RE | Admit: 2013-11-24 | Discharge: 2013-11-24 | Disposition: A | Discharge status: Home / Self Care | Payer: Medicare HMO | Source: Ambulatory Visit | Attending: Radiation Oncology | Admitting: Radiation Oncology

## 2013-11-24 ENCOUNTER — Encounter: Payer: Self-pay | Admitting: Radiation Oncology

## 2013-11-24 DIAGNOSIS — Z510 Encounter for antineoplastic radiation therapy: Secondary | ICD-10-CM | POA: Diagnosis not present

## 2013-11-24 NOTE — Progress Notes (Signed)
  Radiation Oncology         [phone] ________________________________  Name: Kristi Carr MRN: [id number]  Date: 11/24/2013  DOB: 06-23-1945  Simulation Verification Note  Status: outpatient  NARRATIVE: The patient was brought to the treatment unit and placed in the planned treatment position. The clinical setup was verified. Then port films were obtained and uploaded to the radiation oncology medical record software.  The treatment beams were carefully compared against the planned radiation fields. The position location and shape of the radiation fields was reviewed. They targeted volume of tissue appears to be appropriately covered by the radiation beams. Organs at risk appear to be excluded as planned.  Based on my personal review, I approved the simulation verification. The patient's treatment will proceed as planned.  -----------------------------------  Blair Promise, PhD, MD

## 2013-11-25 ENCOUNTER — Ambulatory Visit
Admission: RE | Admit: 2013-11-25 | Discharge: 2013-11-25 | Disposition: A | Discharge status: Home / Self Care | Payer: Medicare HMO | Source: Ambulatory Visit | Attending: Radiation Oncology | Admitting: Radiation Oncology

## 2013-11-25 DIAGNOSIS — Z510 Encounter for antineoplastic radiation therapy: Secondary | ICD-10-CM | POA: Diagnosis not present

## 2013-11-26 ENCOUNTER — Ambulatory Visit
Admission: RE | Admit: 2013-11-26 | Discharge: 2013-11-26 | Disposition: A | Discharge status: Home / Self Care | Payer: Medicare HMO | Source: Ambulatory Visit | Attending: Radiation Oncology | Admitting: Radiation Oncology

## 2013-11-26 DIAGNOSIS — Z510 Encounter for antineoplastic radiation therapy: Secondary | ICD-10-CM | POA: Diagnosis not present

## 2013-11-29 ENCOUNTER — Ambulatory Visit
Admission: RE | Admit: 2013-11-29 | Discharge: 2013-11-29 | Disposition: A | Discharge status: Home / Self Care | Payer: Medicare HMO | Source: Ambulatory Visit | Attending: Radiation Oncology | Admitting: Radiation Oncology

## 2013-11-29 DIAGNOSIS — Z510 Encounter for antineoplastic radiation therapy: Secondary | ICD-10-CM | POA: Diagnosis not present

## 2013-11-30 ENCOUNTER — Encounter: Payer: Self-pay | Admitting: Radiation Oncology

## 2013-11-30 ENCOUNTER — Ambulatory Visit
Admission: RE | Admit: 2013-11-30 | Discharge: 2013-11-30 | Disposition: A | Discharge status: Home / Self Care | Payer: Medicare HMO | Source: Ambulatory Visit | Attending: Radiation Oncology | Admitting: Radiation Oncology

## 2013-11-30 VITALS — BP 171/81 | HR 58 | Temp 97.5°F | Ht 65.0 in | Wt 143.8 lb

## 2013-11-30 DIAGNOSIS — Z510 Encounter for antineoplastic radiation therapy: Secondary | ICD-10-CM | POA: Diagnosis not present

## 2013-11-30 DIAGNOSIS — C50412 Malignant neoplasm of upper-outer quadrant of left female breast: Secondary | ICD-10-CM

## 2013-11-30 MED ORDER — SILVER SULFADIAZINE 1 % EX CREA: TOPICAL_CREAM | Freq: Every day | CUTANEOUS | Status: DC

## 2013-11-30 MED ORDER — RADIAPLEXRX EX GEL
Freq: Once | CUTANEOUS | Status: AC
  Administered 2013-11-30: 14:00:00 via TOPICAL

## 2013-11-30 MED ORDER — SILVER SULFADIAZINE 1 % EX CREA
TOPICAL_CREAM | Freq: Two times a day (BID) | CUTANEOUS | Status: DC
  Administered 2013-11-30: 11:00:00 via TOPICAL

## 2013-11-30 NOTE — Progress Notes (Signed)
  Radiation Oncology         [phone]) [phone] ________________________________  Name: Kristi Carr MRN: [id number]  Date: 11/30/2013  DOB: [birth year]  Weekly Radiation Therapy Management  Breast cancer of upper-outer quadrant of left female breast   Primary site: Breast (Left)   Staging method: AJCC 7th Edition   Clinical: Stage IIA (T2, N0, cM0)   Summary: Stage IIA (T2, N0, cM0)   Clinical comments: Staged at breast conference 08/11/13.   Current Dose: 59.4 Gy     Planned Dose:  63 Gy  Narrative . . . . . . . . The patient presents for routine under treatment assessment.                                   The patient is without complaint except for some heaviness within the breast and some discomfort. She is also noticed some itching but overall better since early July. She is using Neosporin ointment in the inframammary fold area                                 Set-up films were reviewed.                                 The chart was checked. Physical Findings. . .  height is 5\' 5"  (1.651 m) and weight is 143 lb 12.8 oz (65.227 kg). Her oral temperature is 97.5 F (36.4 C). Her blood pressure is 171/81 and her pulse is 58. . The lungs are clear. The heart has a regular rhythm and rate. Examination of the left breast reveals diffuse erythema and swelling. In the inframammary fold and lower aspect of the breast she has some healing moist desquamation Impression . . . . . . . The patient is tolerating radiation. Plan . . . . . . . . . . . . Continue treatment as planned.  She will be switched to Silvadene for the moist desquamation.  ________________________________   Blair Promise, PhD, MD

## 2013-11-30 NOTE — Progress Notes (Signed)
Kristi Carr has completed 33 fractions to her left breast.  She denies pain and fatigue.  She reports using radiaplex and needs a refill.  Another tube has been given.  She is also using neosporin pain as needed underneath her left breast.  The skin on her left breast is red.  She has desquamation underneath her left breast.  She reports it does not hurt and is itching.  She has been given a one month follow up card.

## 2013-11-30 NOTE — Addendum Note (Signed)
Encounter addended by: Jacqulyn Liner, RN on: 11/30/2013  1:33 PM<BR>     Documentation filed: Inpatient MAR

## 2013-11-30 NOTE — Progress Notes (Signed)
  Radiation Oncology         [phone] ________________________________  Name: Kristi Carr MRN: [id number]  Date: 11/30/2013  DOB: [birth year]  Simulation Verification Note  Status: outpatient  NARRATIVE: The patient was brought to the treatment unit and placed in the planned treatment position. The clinical setup was verified. Then port films were obtained and uploaded to the radiation oncology medical record software.  The treatment beams were carefully compared against the planned radiation fields. The position location and shape of the radiation fields was reviewed. They targeted volume of tissue appears to be appropriately covered by the radiation beams. Organs at risk appear to be excluded as planned.  Based on my personal review, I approved the simulation verification. The patient's treatment will proceed as planned.  -----------------------------------  Blair Promise, PhD, MD

## 2013-12-01 ENCOUNTER — Ambulatory Visit: Payer: Medicare HMO

## 2013-12-01 ENCOUNTER — Ambulatory Visit
Admission: RE | Admit: 2013-12-01 | Discharge: 2013-12-01 | Disposition: A | Discharge status: Home / Self Care | Payer: Medicare HMO | Source: Ambulatory Visit | Attending: Radiation Oncology | Admitting: Radiation Oncology

## 2013-12-01 DIAGNOSIS — Z510 Encounter for antineoplastic radiation therapy: Secondary | ICD-10-CM | POA: Diagnosis not present

## 2013-12-02 ENCOUNTER — Encounter: Payer: Self-pay | Admitting: *Deleted

## 2013-12-02 ENCOUNTER — Ambulatory Visit
Admission: RE | Admit: 2013-12-02 | Discharge: 2013-12-02 | Disposition: A | Discharge status: Home / Self Care | Payer: Medicare HMO | Source: Ambulatory Visit | Attending: Radiation Oncology | Admitting: Radiation Oncology

## 2013-12-02 DIAGNOSIS — Z510 Encounter for antineoplastic radiation therapy: Secondary | ICD-10-CM | POA: Diagnosis not present

## 2013-12-02 NOTE — Progress Notes (Signed)
Patient completed rad txs today, per  Dr.Kinard,MD, no  Need to see unless patient having problems, patient stated"NO, the Silvadene is working great on my breast", she already has 1 month follow up 8:29 AM

## 2013-12-06 ENCOUNTER — Other Ambulatory Visit: Payer: Self-pay | Admitting: *Deleted

## 2013-12-06 DIAGNOSIS — C50412 Malignant neoplasm of upper-outer quadrant of left female breast: Secondary | ICD-10-CM

## 2013-12-07 ENCOUNTER — Telehealth: Payer: Self-pay | Admitting: Oncology

## 2013-12-07 ENCOUNTER — Ambulatory Visit (HOSPITAL_BASED_OUTPATIENT_CLINIC_OR_DEPARTMENT_OTHER): Payer: Medicare HMO | Admitting: Oncology

## 2013-12-07 ENCOUNTER — Other Ambulatory Visit (HOSPITAL_BASED_OUTPATIENT_CLINIC_OR_DEPARTMENT_OTHER): Payer: Medicare HMO

## 2013-12-07 VITALS — BP 180/83 | HR 66 | Temp 97.9°F | Resp 18 | Ht 65.0 in | Wt 144.4 lb

## 2013-12-07 DIAGNOSIS — C50419 Malignant neoplasm of upper-outer quadrant of unspecified female breast: Secondary | ICD-10-CM

## 2013-12-07 DIAGNOSIS — Z170 Estrogen receptor positive status [ER+]: Secondary | ICD-10-CM

## 2013-12-07 DIAGNOSIS — C50412 Malignant neoplasm of upper-outer quadrant of left female breast: Secondary | ICD-10-CM

## 2013-12-07 LAB — CBC WITH DIFFERENTIAL/PLATELET
BASO%: 0.4 % (ref 0.0–2.0)
Basophils Absolute: 0 10*3/uL (ref 0.0–0.1)
EOS ABS: 0.2 10*3/uL (ref 0.0–0.5)
EOS%: 5.1 % (ref 0.0–7.0)
HCT: 34.2 % — ABNORMAL LOW (ref 34.8–46.6)
HEMOGLOBIN: 11.2 g/dL — AB (ref 11.6–15.9)
LYMPH#: 0.7 10*3/uL — AB (ref 0.9–3.3)
LYMPH%: 17.5 % (ref 14.0–49.7)
MCH: 29.1 pg (ref 25.1–34.0)
MCHC: 32.7 g/dL (ref 31.5–36.0)
MCV: 88.9 fL (ref 79.5–101.0)
MONO#: 0.6 10*3/uL (ref 0.1–0.9)
MONO%: 14.2 % — AB (ref 0.0–14.0)
NEUT#: 2.6 10*3/uL (ref 1.5–6.5)
NEUT%: 62.8 % (ref 38.4–76.8)
Platelets: 212 10*3/uL (ref 145–400)
RBC: 3.84 10*6/uL (ref 3.70–5.45)
RDW: 13.6 % (ref 11.2–14.5)
WBC: 4.2 10*3/uL (ref 3.9–10.3)

## 2013-12-07 LAB — COMPREHENSIVE METABOLIC PANEL (CC13)
ALBUMIN: 3.7 g/dL (ref 3.5–5.0)
ALT: 13 U/L (ref 0–55)
AST: 14 U/L (ref 5–34)
Alkaline Phosphatase: 65 U/L (ref 40–150)
Anion Gap: 7 mEq/L (ref 3–11)
BUN: 14.1 mg/dL (ref 7.0–26.0)
CALCIUM: 9.6 mg/dL (ref 8.4–10.4)
CHLORIDE: 107 meq/L (ref 98–109)
CO2: 29 mEq/L (ref 22–29)
Creatinine: 0.8 mg/dL (ref 0.6–1.1)
Glucose: 86 mg/dl (ref 70–140)
POTASSIUM: 4.1 meq/L (ref 3.5–5.1)
SODIUM: 143 meq/L (ref 136–145)
TOTAL PROTEIN: 7.1 g/dL (ref 6.4–8.3)
Total Bilirubin: 0.63 mg/dL (ref 0.20–1.20)

## 2013-12-07 MED ORDER — TAMOXIFEN CITRATE 20 MG PO TABS: 20.0000 mg | ORAL_TABLET | Freq: Every day | ORAL | Status: DC

## 2013-12-07 NOTE — Addendum Note (Signed)
Addended by: Amelia Jo I on: 12/07/2013 01:05 PM   Modules accepted: Orders

## 2013-12-07 NOTE — Progress Notes (Signed)
[place]  Telephone:([phone]) [phone] Fax:(336) [phone]     ID: [name] OB: 09/02/1945  MR#: [id number]  HDQ#:[id number]  PCP: Elsie Stain, MD GYN: Paula Compton  SU: Rolm Bookbinder OTHER MD: Gery Pray  CHIEF COMPLAINT: Estrogen receptor positive breast cancer CURRENT THERAPY: Antiestrogen therapy  BREAST CANCER HISTORY: Kristi Carr had routine screening mammography at [facility] 07/16/2013. This suggested a mass at the 1:00 position in the left breast. Ultrasound of the left breast and axilla 07/23/2013 showed a 1.7 cm lobulated mass at the 2:00 position, which was hypoechoic. The left axilla was unremarkable.  Biopsy of the mass in question 08/02/2013 showed ([id number]) and invasive ductal (papillary) carcinoma, grade 1, estrogen receptor and progesterone receptor both 100% positive with strong staining intensity, with an MIB-1 of 9% and no HER-2 amplification, the signals ratio being 1.08 and date number per cell 1.95.  Her subsequent history is as detailed below  INTERVAL HISTORY: Kristi Carr returns today for followup of her early stage breast cancer. Since her last visit hereShe had her definitive surgery, 09/06/2013, which showed ([id number]) and invasive ductal carcinoma, measuring 1.8 cm, grade 1, with the single sentinel lymph node negative. Repeat HER-2 was again negative. Margins were clear though close. The patient then proceeded to adjuvant radiation which was completed 11/30/2013. She is now ready to discuss systemic therapy  REVIEW OF SYSTEMS: Kristi Carr tolerated radiation well, with minimal to no fatigue. She did have some erythema and mild desquamation only for the final week. That is a ready recovery. Otherwise she remains very active, working in her home, gardening, and doing a lot of canning. Sometimes she has shooting pains across the top of the left breast. She was reassured that these are treatment related and not cancer related. A detailed review of  systems today was otherwise noncontributory  PAST MEDICAL HISTORY: Past Medical History  Diagnosis Date  . Gallstones 08/23/03    x 2  . Anemia   . Hypertension   . Hot flashes   . Wears glasses   . Breast cancer of upper-outer quadrant of left female breast   . Kidney stones     PAST SURGICAL HISTORY: Past Surgical History  Procedure Laterality Date  . Cholecystectomy    . Wisdom tooth extraction    . Partial hysterectomy  1980    ovaries left in  . Tonsillectomy    . Tubal ligation    . Colonoscopy    . Breast lumpectomy with sentinel lymph node biopsy  09/06/13    LEFT BREAST SEED GUIDED LUMPECTOMY WITH LEFT AXILLARY SENTINEL NODE BIOPSY (Left)    FAMILY HISTORY Family History  Problem Relation Age of Onset  . Diabetes Mother   . Hyperlipidemia Mother   . Hypertension Mother   . Cancer Mother     Breast   . Breast cancer Mother   . Heart attack Father   . Heart disease Father   . Depression Neg Hx   . Alcohol abuse Neg Hx   . Drug abuse Neg Hx   . Colon cancer Neg Hx   . Uterine cancer Neg Hx   . Ovarian cancer Neg Hx   . Prostate cancer Neg Hx   . Hypertension Sister   . Heart disease Brother     CHF  EF 10%  . Hypertension Brother   . Gout Brother    the patient's father died from a myocardial infarction at age 50. The patient's mother died from intestinal blockage at  age 107. The patient had one brother, one sister. The patient's mother was diagnosed with breast cancer at age 75. There is no other history of cancer in the family to her knowledge.  GYNECOLOGIC HISTORY:  Menarche age 68, first live birth age 75, the patient is Kristi Carr P2. She underwent hysterectomy in 1979 but her ovaries are still in place. She did not use hormone replacement.  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 Her husband Kristi Carr used to work for [employer]. Son Kristi Carr lives in [place] where he works as a Librarian, academic for [employer]. Son Kristi Carr lives in [place] as Clinical cytogeneticist for [employer] and environmental services. The patient has 4 grandchildren. She is a Psychologist, forensic     ADVANCED DIRECTIVES: Not in place   HEALTH MAINTENANCE: History  Substance Use Topics  . Smoking status: Never Smoker   . Smokeless tobacco: Never Used  . Alcohol Use: No     Colonoscopy: 2011  PAP: Status post hysterectomy  Bone density: Remote; showed osteopenia  Lipid panel:  No Known Allergies  Current Outpatient Prescriptions  Medication Sig Dispense Refill  . Calcium Carbonate-Vitamin D 600-400 MG-UNIT per tablet Take 1 tablet by mouth daily.        . Cholecalciferol (VITAMIN D) 1000 UNITS capsule Take 1,000 Units by mouth daily.        . cholestyramine (QUESTRAN) 4 GM/DOSE powder Take 0.5 packets (2 g total) by mouth 2 (two) times daily as needed (loose stools).  378 g  12  . co-enzyme Q-10 30 MG capsule Take 30 mg by mouth daily.      . hyaluronate sodium (RADIAPLEXRX) GEL Apply 1 application topically 2 (two) times daily.      Marland Kitchen ibuprofen (GNP IBUPROFEN) 200 MG tablet Take 200 mg by mouth as needed.        . magnesium 30 MG tablet Take 30 mg by mouth daily.        . metoprolol succinate (TOPROL-XL) 25 MG 24 hr tablet 25 mg. TAKE 1 TABLET TWICE A DAY      . multivitamin (THERAGRAN) per tablet Take 1 tablet by mouth daily.        . non-metallic deodorant Jethro Poling) MISC Apply 1 application topically daily as needed.      . Omega-3 Fatty Acids (FISH OIL) 1000 MG CPDR Take by mouth daily.        . silver sulfADIAZINE (SILVADENE) 1 % cream Apply 1 application topically 2 (two) times daily.        No current facility-administered medications for this visit.    OBJECTIVE: Middle-aged white woman who appears stated age  36 Vitals:   12/07/13 1103  BP: 180/83  Pulse: 66  Temp: 97.9 F (36.6 C)  Resp: 18     Body mass index is 24.03 kg/(m^2).    ECOG FS:0 - Asymptomatic  Sclerae unicteric, pupils equal and reactive Oropharynx clear and moist No cervical or  supraclavicular adenopathy Lungs no rales or rhonchi Heart regular rate and rhythm Abd soft, nontender, positive bowel sounds MSK no focal spinal tenderness, no upper extremity lymphedema Neuro: nonfocal, well oriented, positive affect Breasts: The right breast is unremarkable. The left breast is status post lumpectomy and radiation. There is erythema over the port area, but no desquamation. There are no palpable masses. The left axilla is benign.    LAB RESULTS:  CMP     Component Value Date/Time   NA 145 08/11/2013 1200  NA 144 10/27/2012 0900   K 4.1 08/11/2013 1200   K 4.5 10/27/2012 0900   CL 106 10/27/2012 0900   CO2 28 08/11/2013 1200   CO2 27 10/27/2012 0900   GLUCOSE 92 08/11/2013 1200   GLUCOSE 91 10/27/2012 0900   BUN 14.7 08/11/2013 1200   BUN 16 10/27/2012 0900   CREATININE 0.8 08/11/2013 1200   CREATININE 0.8 10/27/2012 0900   CALCIUM 9.9 08/11/2013 1200   CALCIUM 9.5 10/27/2012 0900   PROT 7.6 08/11/2013 1200   PROT 8.0 10/27/2012 0900   ALBUMIN 4.0 08/11/2013 1200   ALBUMIN 4.1 10/27/2012 0900   AST 14 08/11/2013 1200   AST 15 10/27/2012 0900   ALT 11 08/11/2013 1200   ALT 11 10/27/2012 0900   ALKPHOS 74 08/11/2013 1200   ALKPHOS 67 10/27/2012 0900   BILITOT 0.91 08/11/2013 1200   BILITOT 1.0 10/27/2012 0900   GFRNONAA 90 06/22/2008 0958   GFRAA 109 06/22/2008 0958    I No results found for this basename: SPEP,  UPEP,   kappa and lambda light chains    Lab Results  Component Value Date   WBC 4.4 08/11/2013   NEUTROABS 2.5 08/11/2013   HGB 11.3* 08/11/2013   HCT 34.5* 08/11/2013   MCV 88.7 08/11/2013   PLT 246 08/11/2013      Chemistry      Component Value Date/Time   NA 145 08/11/2013 1200   NA 144 10/27/2012 0900   K 4.1 08/11/2013 1200   K 4.5 10/27/2012 0900   CL 106 10/27/2012 0900   CO2 28 08/11/2013 1200   CO2 27 10/27/2012 0900   BUN 14.7 08/11/2013 1200   BUN 16 10/27/2012 0900   CREATININE 0.8 08/11/2013 1200   CREATININE 0.8 10/27/2012 0900      Component Value  Date/Time   CALCIUM 9.9 08/11/2013 1200   CALCIUM 9.5 10/27/2012 0900   ALKPHOS 74 08/11/2013 1200   ALKPHOS 67 10/27/2012 0900   AST 14 08/11/2013 1200   AST 15 10/27/2012 0900   ALT 11 08/11/2013 1200   ALT 11 10/27/2012 0900   BILITOT 0.91 08/11/2013 1200   BILITOT 1.0 10/27/2012 0900       No results found for this basename: LABCA2    No components found with this basename: LABCA125    No results found for this basename: INR,  in the last 168 hours  Urinalysis No results found for this basename: colorurine,  appearanceur,  labspec,  phurine,  glucoseu,  hgbur,  bilirubinur,  ketonesur,  proteinur,  urobilinogen,  nitrite,  leukocytesur    STUDIES: Bone density at [facility] 09/23/2013 shows osteopenia with a T score of -1.9  ASSESSMENT: 68 y.o. [place] woman status post left breast biopsy 08/02/2013 for a clinical T2 N0, stage IIA invasive papillary breast cancer, grade 1, estrogen receptor and progesterone receptor both 100% positive, with an MIB-1 of 9% and no HER-2 amplification  (1) Status post left lumpectomy and sentinel lymph node sampling 09/06/2013 for a pT1c pN0, stage IA invasive ductal carcinoma, grade 1, repeat HER-2 again negative  (2) Adjuvant radiation completed 11/30/2013  (3) starting tamoxifen 12/18/2013  PLAN: I met with Vaughan Basta for approximately 45 minutes today to go over her situation. She understands she had an early stage, nonaggressive looking estrogen receptor positive breast cancer, and that her overall prognosis is good. She has completed her local treatment, namely surgery and radiation, and is now ready to start systemic therapy.  We spent most  of the visit discussing the difference between tamoxifen and aromatase inhibitors. [place] has a good understanding of the possible toxicities, side effects and complications of these agents. She is status post hysterectomy, which takes care of the issue of endometrial cancer. She also took birth control pills for  many years with no clotting issues. This predicts that her chance of a blood clot with tamoxifen would be very low. For these reasons, and because she has significant osteopenia already, she very much would prefer to start with tamoxifen and that is what we are doing.  She will have her first dose August 1. She will return to see me in October. If she tolerates tamoxifen well we will do it for 5 years and then consider whether she wants to try an aromatase inhibitor for 2 years after that or simply stop. [place] has a good understanding of the overall plan. She agrees with it. She knows a goal of treatment in her cases cure. She will call with any problems that may develop before next visit here.   Chauncey Cruel, MD   12/07/2013 11:10 AM

## 2013-12-07 NOTE — Telephone Encounter (Signed)
per pof to sch pt appt-gave pt copy of sch °

## 2013-12-26 ENCOUNTER — Encounter: Payer: Self-pay | Admitting: Radiation Oncology

## 2013-12-26 NOTE — Progress Notes (Signed)
  Radiation Oncology         ([phone]) [phone] ________________________________  Name: Kristi Carr MRN: [id number]  Date: 12/26/2013  DOB: 1945-12-09  End of Treatment Note  Diagnosis:   Stage I invasive papillary carcinoma of the left breast (pT1c, pN0, Mx)       Indication for treatment:  Breast conservation therapy       Radiation treatment dates:   May 28th through July 16  Site/dose:   Left breast 50.4 gray in 28 fractions, the lumpectomy cavity boost 12.6 gray in 7 fractions, cumulative dose of 63 gray  Beams/energy:   3-D conformal using tangential beam arrangements, 10x,6x; lumpectomy cavity boost using a three-field photon boost, 10x, 15x, 6x  Narrative: The patient tolerated radiation treatment relatively well.   She did develop some itching and discomfort within the breast as well as moist desquamation in the inframammary fold which respond to Neosporin/Silvadene  Plan: The patient has completed radiation treatment. The patient will return to radiation oncology clinic for routine followup in one month. I advised them to call or return sooner if they have any questions or concerns related to their recovery or treatment.  -----------------------------------  Blair Promise, PhD, MD

## 2014-01-04 ENCOUNTER — Ambulatory Visit (INDEPENDENT_AMBULATORY_CARE_PROVIDER_SITE_OTHER): Payer: Medicare HMO | Admitting: Family Medicine

## 2014-01-04 ENCOUNTER — Encounter: Payer: Self-pay | Admitting: Family Medicine

## 2014-01-04 VITALS — BP 162/92 | HR 64 | Temp 98.2°F | Wt 143.0 lb

## 2014-01-04 DIAGNOSIS — I10 Essential (primary) hypertension: Secondary | ICD-10-CM

## 2014-01-04 MED ORDER — METOPROLOL SUCCINATE ER 25 MG PO TB24: 25.0000 mg | ORAL_TABLET | Freq: Two times a day (BID) | ORAL | Status: DC

## 2014-01-04 NOTE — Patient Instructions (Addendum)
Take care.  Glad to see you.  I would get a flu shot each fall.   Recheck at a physical in 1 year.  See if Dr. Earlean Shawl will send a copy of you colonoscopy report over next month.

## 2014-01-04 NOTE — Assessment & Plan Note (Signed)
Controlled out of office, labs reviewed, recent Cr wnl.  Continue as is. >15 minutes spent in face to face time with patient, >50% spent in counselling or coordination of care

## 2014-01-04 NOTE — Progress Notes (Signed)
Pre visit review using our clinic review tool, if applicable. No additional management support is needed unless otherwise documented below in the visit note.  S/p surgery and radiation for breast cancer, now on tamoxifen, per onc.  Prev ONC note reviewed.  D/w pt.    Hypertension:    Using medication without problems or lightheadedness: yes Chest pain with exertion:no Edema:no Short of breath:no Average home BPs: 120-130s/70-80s Her cuff correlated with ours today, so her BPs are likely accurate and lower at home, ie white coat response.    She had f/u colonoscopy with Dr. Earlean Shawl next month.    Meds, vitals, and allergies reviewed.   ROS: See HPI.  Otherwise negative.    GEN: nad, alert and oriented HEENT: mucous membranes moist NECK: supple w/o LA CV: rrr. PULM: ctab, no inc wob ABD: soft, +bs EXT: no edema SKIN: no acute rash

## 2014-01-05 ENCOUNTER — Encounter: Payer: Self-pay | Admitting: Oncology

## 2014-01-05 ENCOUNTER — Telehealth: Payer: Self-pay | Admitting: Family Medicine

## 2014-01-05 NOTE — Telephone Encounter (Signed)
Relevant patient education mailed to patient.  

## 2014-01-06 ENCOUNTER — Ambulatory Visit
Admission: RE | Admit: 2014-01-06 | Discharge: 2014-01-06 | Disposition: A | Discharge status: Home / Self Care | Payer: Medicare HMO | Source: Ambulatory Visit | Attending: Radiation Oncology | Admitting: Radiation Oncology

## 2014-01-06 ENCOUNTER — Encounter: Payer: Self-pay | Admitting: Radiation Oncology

## 2014-01-06 VITALS — BP 172/68 | HR 64 | Temp 97.9°F | Resp 20 | Ht 65.0 in | Wt 144.1 lb

## 2014-01-06 DIAGNOSIS — C50412 Malignant neoplasm of upper-outer quadrant of left female breast: Secondary | ICD-10-CM

## 2014-01-06 NOTE — Progress Notes (Signed)
Follow up left breast cancer,  Breast looks  Great slight pink , skin intact, takes tamoxifen 20 mg daily, occasional twinges  In breast no pain,  8:26 AM

## 2014-01-06 NOTE — Progress Notes (Signed)
Radiation Oncology         [phone] ________________________________  Name: Kristi Carr MRN: [id number]  Date: 01/06/2014  DOB: 09/20/1945  Follow-Up Visit Note  CC: Elsie Stain, MD  Rolm Bookbinder, MD  Diagnosis:   Stage I invasive papillary carcinoma of the left breast (pT1c, pN0, Mx)   Interval Since Last Radiation:  1  months  Narrative:  The patient returns today for routine follow-up.  She is doing well at this time. She is a will have some sharp pains within the breast but no consistent pain. She denies any nipple discharge or bleeding. Her energy level is 100%. Patient has started tamoxifen and is tolerating this well.                              ALLERGIES:  has No Known Allergies.  Meds: Current Outpatient Prescriptions  Medication Sig Dispense Refill  . cholestyramine (QUESTRAN) 4 GM/DOSE powder Take 0.5 packets (2 g total) by mouth 2 (two) times daily as needed (loose stools).  378 g  12  . co-enzyme Q-10 30 MG capsule Take 30 mg by mouth daily.      . ergocalciferol (VITAMIN D2) 50000 UNITS capsule Take 50,000 Units by mouth every 30 (thirty) days.      Marland Kitchen ibuprofen (GNP IBUPROFEN) 200 MG tablet Take 200 mg by mouth as needed.        . magnesium 30 MG tablet Take 30 mg by mouth daily.        . metoprolol succinate (TOPROL-XL) 25 MG 24 hr tablet Take 1 tablet (25 mg total) by mouth 2 (two) times daily.  180 tablet  3  . multivitamin (THERAGRAN) per tablet Take 1 tablet by mouth daily.        . Omega-3 Fatty Acids (FISH OIL) 1000 MG CPDR Take by mouth daily.        . tamoxifen (NOLVADEX) 20 MG tablet Take 1 tablet (20 mg total) by mouth daily.  90 tablet  12  . Calcium Carbonate-Vitamin D 600-400 MG-UNIT per tablet Take 1 tablet by mouth daily.         No current facility-administered medications for this encounter.    Physical Findings: The patient is in no acute distress. Patient is alert and oriented.  height is 5\' 5"  (1.651 m) and weight is 144 lb 1.6  oz (65.363 kg). Her oral temperature is 97.9 F (36.6 C). Her blood pressure is 172/68 and her pulse is 64. Her respiration is 20. Marland Kitchen  No palpable supraclavicular or axillary adenopathy. The lungs are clear to auscultation. The heart has regular rhythm and rate. Examination right breast reveals it to be large and pendulous without mass or nipple discharge. Examination of the left breast reveals it to be large and pendulous with some mild erythema centrally. The patient's skin reaction has cleared up at this time. The patient has mild edema in the breast. No dominant masses appreciated in the breast nipple discharge or bleeding.  Lab Findings: Lab Results  Component Value Date   WBC 4.2 12/07/2013   HGB 11.2* 12/07/2013   HCT 34.2* 12/07/2013   MCV 88.9 12/07/2013   PLT 212 12/07/2013    Radiographic Findings: No results found.  Impression:  The patient is recovering from the effects of radiation.  No evidence recurrence on clinical exam today  Plan:  Routine followup in 6 months. Patient will be seen by medical oncology  in 2-3 months  ____________________________________ Blair Promise, MD

## 2014-01-19 ENCOUNTER — Other Ambulatory Visit: Payer: Self-pay | Admitting: Family Medicine

## 2014-01-27 LAB — HM COLONOSCOPY

## 2014-02-11 ENCOUNTER — Encounter: Payer: Self-pay | Admitting: Family Medicine

## 2014-03-10 ENCOUNTER — Other Ambulatory Visit (HOSPITAL_BASED_OUTPATIENT_CLINIC_OR_DEPARTMENT_OTHER): Payer: Medicare HMO

## 2014-03-10 DIAGNOSIS — C50412 Malignant neoplasm of upper-outer quadrant of left female breast: Secondary | ICD-10-CM

## 2014-03-10 LAB — COMPREHENSIVE METABOLIC PANEL (CC13)
ALT: 15 U/L (ref 0–55)
ANION GAP: 8 meq/L (ref 3–11)
AST: 18 U/L (ref 5–34)
Albumin: 3.4 g/dL — ABNORMAL LOW (ref 3.5–5.0)
Alkaline Phosphatase: 48 U/L (ref 40–150)
BILIRUBIN TOTAL: 0.52 mg/dL (ref 0.20–1.20)
BUN: 14.9 mg/dL (ref 7.0–26.0)
CO2: 28 meq/L (ref 22–29)
CREATININE: 0.8 mg/dL (ref 0.6–1.1)
Calcium: 9.2 mg/dL (ref 8.4–10.4)
Chloride: 108 mEq/L (ref 98–109)
Glucose: 80 mg/dl (ref 70–140)
Potassium: 3.9 mEq/L (ref 3.5–5.1)
Sodium: 144 mEq/L (ref 136–145)
Total Protein: 7 g/dL (ref 6.4–8.3)

## 2014-03-10 LAB — CBC WITH DIFFERENTIAL/PLATELET
BASO%: 0.5 % (ref 0.0–2.0)
Basophils Absolute: 0 10*3/uL (ref 0.0–0.1)
EOS%: 3.2 % (ref 0.0–7.0)
Eosinophils Absolute: 0.1 10*3/uL (ref 0.0–0.5)
HEMATOCRIT: 32.1 % — AB (ref 34.8–46.6)
HGB: 10.4 g/dL — ABNORMAL LOW (ref 11.6–15.9)
LYMPH%: 24.4 % (ref 14.0–49.7)
MCH: 29.3 pg (ref 25.1–34.0)
MCHC: 32.5 g/dL (ref 31.5–36.0)
MCV: 90.2 fL (ref 79.5–101.0)
MONO#: 0.5 10*3/uL (ref 0.1–0.9)
MONO%: 13.4 % (ref 0.0–14.0)
NEUT#: 2 10*3/uL (ref 1.5–6.5)
NEUT%: 58.5 % (ref 38.4–76.8)
PLATELETS: 204 10*3/uL (ref 145–400)
RBC: 3.56 10*6/uL — ABNORMAL LOW (ref 3.70–5.45)
RDW: 13.4 % (ref 11.2–14.5)
WBC: 3.4 10*3/uL — ABNORMAL LOW (ref 3.9–10.3)
lymph#: 0.8 10*3/uL — ABNORMAL LOW (ref 0.9–3.3)

## 2014-03-17 ENCOUNTER — Ambulatory Visit (HOSPITAL_BASED_OUTPATIENT_CLINIC_OR_DEPARTMENT_OTHER): Payer: Medicare HMO | Admitting: Oncology

## 2014-03-17 ENCOUNTER — Telehealth: Payer: Self-pay | Admitting: Oncology

## 2014-03-17 VITALS — BP 145/67 | HR 68 | Temp 98.2°F | Resp 18 | Ht 65.0 in | Wt 142.2 lb

## 2014-03-17 DIAGNOSIS — Z170 Estrogen receptor positive status [ER+]: Secondary | ICD-10-CM

## 2014-03-17 DIAGNOSIS — C50412 Malignant neoplasm of upper-outer quadrant of left female breast: Secondary | ICD-10-CM

## 2014-03-17 DIAGNOSIS — E559 Vitamin D deficiency, unspecified: Secondary | ICD-10-CM

## 2014-03-17 NOTE — Progress Notes (Signed)
Kristi Carr  [phone] Fax:(336) [phone]     ID: Kristi Carr OB: 19-Sep-1945  MR#: [id number]  MVH#:[id number]  PCP: Elsie Stain, MD GYN: Paula Compton  SU: Rolm Bookbinder OTHER MD: Gery Pray  CHIEF COMPLAINT: Estrogen receptor positive breast cancer CURRENT THERAPY: Antiestrogen therapy  BREAST CANCER HISTORY: Kristi Carr had routine screening mammography at [facility] 07/16/2013. This suggested a mass at the 1:00 position in the left breast. Ultrasound of the left breast and axilla 07/23/2013 showed a 1.7 cm lobulated mass at the 2:00 position, which was hypoechoic. The left axilla was unremarkable.  Biopsy of the mass in question 08/02/2013 showed ([id number]) and invasive ductal (papillary) carcinoma, grade 1, estrogen receptor and progesterone receptor both 100% positive with strong staining intensity, with an MIB-1 of 9% and no HER-2 amplification, the signals ratio being 1.08 and date number per cell 1.95.  Her subsequent history is as detailed below  INTERVAL HISTORY: Kristi Carr returns today for followup of her estrogen receptor positive breast cancer. Since her last visit here she completed her radiation treatments. She started tamoxifen 12/18/2013. She is doing remarkably well with this. She may have an occasional extra hot flashes says, but no other symptoms that she is aware of.  REVIEW OF SYSTEMS: Kristi Carr does not exercise regularly but is very active. She just helped her husband take down an enormous Kristi Carr which took them 2 weeks of solid work. Occasionally she has "twinges" associated with her left breast. This is a very common finding and I reassured her a does not mean she has recurrent breast cancer. It is purely a postoperative phenomenon. A detailed review of systems today was otherwise entirely negative.  PAST MEDICAL HISTORY: Past Medical History  Diagnosis Date  . Gallstones 08/23/03    x 2  . Anemia   . Hypertension   . Hot flashes     . Wears glasses   . Breast cancer of upper-outer quadrant of left female breast   . Kidney stones   . Radiation 10/14/13-12/02/13    left breast 50.4 gray, lumpectomy cavity boosted to 63 gray    PAST SURGICAL HISTORY: Past Surgical History  Procedure Laterality Date  . Cholecystectomy    . Wisdom tooth extraction    . Partial hysterectomy  1980    ovaries left in  . Tonsillectomy    . Tubal ligation    . Colonoscopy    . Breast lumpectomy with sentinel lymph node biopsy  09/06/13    LEFT BREAST SEED GUIDED LUMPECTOMY WITH LEFT AXILLARY SENTINEL NODE BIOPSY (Left)    FAMILY HISTORY Family History  Problem Relation Age of Onset  . Diabetes Mother   . Hyperlipidemia Mother   . Hypertension Mother   . Cancer Mother     Breast   . Breast cancer Mother   . Heart attack Father   . Heart disease Father   . Depression Neg Hx   . Alcohol abuse Neg Hx   . Drug abuse Neg Hx   . Colon cancer Neg Hx   . Uterine cancer Neg Hx   . Ovarian cancer Neg Hx   . Prostate cancer Neg Hx   . Hypertension Sister   . Heart disease Brother     CHF  EF 10%  . Hypertension Brother   . Gout Brother    the patient's father died from a myocardial infarction at age 89. The patient's mother died from intestinal blockage at age 15. The patient had one  brother, one sister. The patient's mother was diagnosed with breast cancer at age 28. There is no other history of cancer in the family to her knowledge.  GYNECOLOGIC HISTORY:  Menarche age 5, first live birth age 68, the patient is Kristi Carr P2. She underwent hysterectomy in 1979 but her ovaries are still in place. She did not use hormone replacement.  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 Her husband Kristi Carr used to work for [employer]. Son Kristi Carr lives in [place] where he works as a Librarian, academic for [employer]. Son Kristi Carr lives in [place] as Government social research officer for [employer] and environmental services. The patient has 4 grandchildren.  She is a Psychologist, forensic     ADVANCED DIRECTIVES: Not in place   HEALTH MAINTENANCE: History  Substance Use Topics  . Smoking status: Never Smoker   . Smokeless tobacco: Never Used  . Alcohol Use: No     Colonoscopy: 2011  PAP: Status post hysterectomy  Bone density: Remote; showed osteopenia  Lipid panel:  No Known Allergies  Current Outpatient Prescriptions  Medication Sig Dispense Refill  . Calcium Carbonate-Vitamin D 600-400 MG-UNIT per tablet Take 1 tablet by mouth daily.        . cholestyramine (QUESTRAN) 4 GM/DOSE powder Take 0.5 packets (2 g total) by mouth 2 (two) times daily as needed (loose stools).  378 g  12  . co-enzyme Q-10 30 MG capsule Take 30 mg by mouth daily.      . ergocalciferol (VITAMIN D2) 50000 UNITS capsule Take 50,000 Units by mouth every 30 (thirty) days.      Marland Kitchen ibuprofen (GNP IBUPROFEN) 200 MG tablet Take 200 mg by mouth as needed.        . magnesium 30 MG tablet Take 30 mg by mouth daily.        . metoprolol succinate (TOPROL-XL) 25 MG 24 hr tablet Take 1 tablet (25 mg total) by mouth 2 (two) times daily.  180 tablet  3  . multivitamin (THERAGRAN) per tablet Take 1 tablet by mouth daily.        . Omega-3 Fatty Acids (FISH OIL) 1000 MG CPDR Take by mouth daily.         No current facility-administered medications for this visit.    OBJECTIVE: Middle-aged white woman in no acute distress Filed Vitals:   03/17/14 1300  BP: 145/67  Pulse: 68  Temp: 98.2 F (36.8 C)  Resp: 18     Body mass index is 23.66 kg/(m^2).    ECOG FS:0 - Asymptomatic  Sclerae unicteric, EOMs intact Oropharynx clear, teeth in good repair No cervical or supraclavicular adenopathy Lungs no rales or rhonchi Heart regular rate and rhythm Abd soft, nontender, positive bowel sounds MSK no focal spinal tenderness, no upper extremity lymphedema Neuro: nonfocal, well oriented, positive affect Breasts: The right breast is unremarkable. The left breast is status post lumpectomy and  radiation. There is a minimal residual blush over the breast. The pores are slightly more prominent, secondary to skin thickening/edema from the radiation. The left axilla is benign   LAB RESULTS:  CMP     Component Value Date/Time   NA 144 03/10/2014 0908   NA 144 10/27/2012 0900   K 3.9 03/10/2014 0908   K 4.5 10/27/2012 0900   CL 106 10/27/2012 0900   CO2 28 03/10/2014 0908   CO2 27 10/27/2012 0900   GLUCOSE 80 03/10/2014 0908   GLUCOSE 91 10/27/2012 0900   BUN 14.9 03/10/2014  0908   BUN 16 10/27/2012 0900   CREATININE 0.8 03/10/2014 0908   CREATININE 0.8 10/27/2012 0900   CALCIUM 9.2 03/10/2014 0908   CALCIUM 9.5 10/27/2012 0900   PROT 7.0 03/10/2014 0908   PROT 8.0 10/27/2012 0900   ALBUMIN 3.4* 03/10/2014 0908   ALBUMIN 4.1 10/27/2012 0900   AST 18 03/10/2014 0908   AST 15 10/27/2012 0900   ALT 15 03/10/2014 0908   ALT 11 10/27/2012 0900   ALKPHOS 48 03/10/2014 0908   ALKPHOS 67 10/27/2012 0900   BILITOT 0.52 03/10/2014 0908   BILITOT 1.0 10/27/2012 0900   GFRNONAA 90 06/22/2008 0958   GFRAA 109 06/22/2008 0958    I No results found for this basename: SPEP,  UPEP,   kappa and lambda light chains    Lab Results  Component Value Date   WBC 3.4* 03/10/2014   NEUTROABS 2.0 03/10/2014   HGB 10.4* 03/10/2014   HCT 32.1* 03/10/2014   MCV 90.2 03/10/2014   PLT 204 03/10/2014      Chemistry      Component Value Date/Time   NA 144 03/10/2014 0908   NA 144 10/27/2012 0900   K 3.9 03/10/2014 0908   K 4.5 10/27/2012 0900   CL 106 10/27/2012 0900   CO2 28 03/10/2014 0908   CO2 27 10/27/2012 0900   BUN 14.9 03/10/2014 0908   BUN 16 10/27/2012 0900   CREATININE 0.8 03/10/2014 0908   CREATININE 0.8 10/27/2012 0900      Component Value Date/Time   CALCIUM 9.2 03/10/2014 0908   CALCIUM 9.5 10/27/2012 0900   ALKPHOS 48 03/10/2014 0908   ALKPHOS 67 10/27/2012 0900   AST 18 03/10/2014 0908   AST 15 10/27/2012 0900   ALT 15 03/10/2014 0908   ALT 11 10/27/2012 0900   BILITOT 0.52  03/10/2014 0908   BILITOT 1.0 10/27/2012 0900       No results found for this basename: LABCA2    No components found with this basename: LABCA125    No results found for this basename: INR,  in the last 168 hours  Urinalysis No results found for this basename: colorurine,  appearanceur,  labspec,  phurine,  glucoseu,  hgbur,  bilirubinur,  ketonesur,  proteinur,  urobilinogen,  nitrite,  leukocytesur    STUDIES: Bone density at [facility] 09/23/2013 shows osteopenia with a T score of -1.9  ASSESSMENT: 68 y.o. [place] woman status post left breast biopsy 08/02/2013 for a clinical T2 N0, stage IIA invasive papillary breast cancer, grade 1, estrogen receptor and progesterone receptor both 100% positive, with an MIB-1 of 9% and no HER-2 amplification  (1) Status post left lumpectomy and sentinel lymph node sampling 09/06/2013 for a pT1c pN0, stage IA invasive ductal carcinoma, grade 1, repeat HER-2 again negative  (2) Adjuvant radiation completed 11/30/2013  (3) started tamoxifen 12/18/2013  PLAN: Kristi Carr is tolerating tamoxifen very well. In fact we discussed seeing her not every 3 months for the first 2 years, as we usually do, but every 6 months. She gets her mammography in February so I will see her again in April.  At this point the plan is to continue tamoxifen a minimum of 2 years before considering switching to an aromatase inhibitor.  Kristi Carr has a good understanding of the overall plan. She agrees to it. She knows the goal of treatment in her case is cure. She will call with any problems of bit of L before her next visit here.   Jonita Hirota  C, MD   03/17/2014 1:13 PM

## 2014-03-17 NOTE — Telephone Encounter (Signed)
, °

## 2014-04-05 ENCOUNTER — Other Ambulatory Visit: Payer: Self-pay | Admitting: Family Medicine

## 2014-04-05 NOTE — Telephone Encounter (Signed)
Received faxed refill request from pharmacy. Last refill 11/04/13 with 12 refills. Is it okay to refill medication?

## 2014-04-06 NOTE — Telephone Encounter (Signed)
Sent. Thanks.   

## 2014-05-18 ENCOUNTER — Telehealth: Payer: Self-pay | Admitting: Oncology

## 2014-05-18 NOTE — Telephone Encounter (Signed)
pt cld and left vm wanting confirmation on aapt times & dates-cld 7 spoke to pt and gave pt time & date for appt in April & May-pt understood

## 2014-06-23 ENCOUNTER — Encounter: Payer: Self-pay | Admitting: Radiation Oncology

## 2014-06-23 ENCOUNTER — Ambulatory Visit
Admission: RE | Admit: 2014-06-23 | Discharge: 2014-06-23 | Disposition: A | Discharge status: Home / Self Care | Payer: Medicare HMO | Source: Ambulatory Visit | Attending: Radiation Oncology | Admitting: Radiation Oncology

## 2014-06-23 VITALS — BP 173/90 | HR 78 | Temp 98.1°F | Resp 12 | Ht 65.0 in | Wt 144.2 lb

## 2014-06-23 DIAGNOSIS — C50412 Malignant neoplasm of upper-outer quadrant of left female breast: Secondary | ICD-10-CM

## 2014-06-23 NOTE — Progress Notes (Signed)
Radiation Oncology         [phone] ________________________________  Name: Kristi Carr MRN: [id number]  Date: 06/23/2014  DOB: [birth year]  Follow-Up Visit Note  CC: Elsie Stain, MD  Rolm Bookbinder, MD    ICD-9-CM ICD-10-CM   1. Breast cancer of upper-outer quadrant of left female breast 174.4 C50.412     Diagnosis: Stage I invasive papillary carcinoma of the left breast (pT1c, pN0, Mx)     Interval Since Last Radiation:  7  months  Narrative:  The patient returns today for routine follow-up.  Her sensitivity in the left breast has improved significantly over the past few months. She denies any actual pain nipple discharge or bleeding. She continues to take tamoxifen and is tolerating this well. She is due for mammograms later this month.                            ALLERGIES:  has No Known Allergies.  Meds: Current Outpatient Prescriptions  Medication Sig Dispense Refill  . Calcium Carbonate-Vitamin D 600-400 MG-UNIT per tablet Take 1 tablet by mouth daily.      . cholestyramine (QUESTRAN) 4 G packet TAKE 0.5 PACKETS (2 G TOTAL) BY MOUTH 2 (TWO) TIMES DAILY AS NEEDED (LOOSE STOOLS). 60 packet 12  . clobetasol cream (TEMOVATE) 0.05 %   0  . co-enzyme Q-10 30 MG capsule Take 30 mg by mouth daily.    . ergocalciferol (VITAMIN D2) 50000 UNITS capsule Take 50,000 Units by mouth every 30 (thirty) days.    Marland Kitchen ibuprofen (GNP IBUPROFEN) 200 MG tablet Take 200 mg by mouth as needed.      . magnesium 30 MG tablet Take 30 mg by mouth daily.      . metoprolol succinate (TOPROL-XL) 25 MG 24 hr tablet Take 1 tablet (25 mg total) by mouth 2 (two) times daily. 180 tablet 3  . multivitamin (THERAGRAN) per tablet Take 1 tablet by mouth daily.      . Omega-3 Fatty Acids (FISH OIL) 1000 MG CPDR Take by mouth daily.      . tamoxifen (NOLVADEX) 20 MG tablet Take 20 mg by mouth daily.  12   No current facility-administered medications for this encounter.    Physical Findings: The patient  is in no acute distress. Patient is alert and oriented.  height is 5\' 5"  (1.651 m) and weight is 144 lb 3.2 oz (65.409 kg). Her oral temperature is 98.1 F (36.7 C). Her blood pressure is 173/90 and her pulse is 78. Her respiration is 12 and oxygen saturation is 99%. .  No palpable supraclavicular or axillary adenopathy. The lungs are clear to auscultation. The heart has a regular rhythm and rate. The right breast exam reveals no palpable mass or nipple discharge or bleeding. Examination of the left breast reveals some mild hyperpigmentation changes.  the patient continues to have some mild edema in the breast. No dominant masses appreciated in the breast nipple discharge or bleeding.  Lab Findings: Lab Results  Component Value Date   WBC 3.4* 03/10/2014   HGB 10.4* 03/10/2014   HCT 32.1* 03/10/2014   MCV 90.2 03/10/2014   PLT 204 03/10/2014    Radiographic Findings: No results found.  Impression:  No evidence of recurrence on clinical exam today  Plan:  When necessary follow-up in radiation oncology. Patient will continue close follow-up in medical oncology.  Patient will follow-up with her primary care physician concerning her elevated  blood pressure. She checks her blood pressure on regular basis at home and her values are normal.  ____________________________________ Blair Promise, MD

## 2014-06-23 NOTE — Progress Notes (Signed)
Kristi Carr her for follow up after treatment to her left breast.  She denies pain and fatigue.  She is taking tamoxifen.  The skin on her left breast is intact.  She reports that she occasionally has sharp twinges of pain across her breast which she thinks is from her surgery.

## 2014-06-24 ENCOUNTER — Telehealth: Payer: Self-pay

## 2014-06-24 ENCOUNTER — Telehealth: Payer: Self-pay | Admitting: Oncology

## 2014-06-24 DIAGNOSIS — C50412 Malignant neoplasm of upper-outer quadrant of left female breast: Secondary | ICD-10-CM

## 2014-06-24 NOTE — Telephone Encounter (Signed)
Patient confirm appointment for Mammo @ [facility] for 02/08 @9 :45. WIll call and verify about insurance with there office.

## 2014-06-24 NOTE — Telephone Encounter (Signed)
Pt called stating [facility] is requesting order for diagnostic mammogram. Order placed in epic.

## 2014-07-26 ENCOUNTER — Encounter: Payer: Self-pay | Admitting: Oncology

## 2014-09-15 ENCOUNTER — Other Ambulatory Visit (HOSPITAL_BASED_OUTPATIENT_CLINIC_OR_DEPARTMENT_OTHER): Payer: Medicare HMO

## 2014-09-15 DIAGNOSIS — C50412 Malignant neoplasm of upper-outer quadrant of left female breast: Secondary | ICD-10-CM | POA: Diagnosis not present

## 2014-09-15 LAB — COMPREHENSIVE METABOLIC PANEL (CC13)
ALT: 14 U/L (ref 0–55)
AST: 19 U/L (ref 5–34)
Albumin: 3.5 g/dL (ref 3.5–5.0)
Alkaline Phosphatase: 54 U/L (ref 40–150)
Anion Gap: 10 mEq/L (ref 3–11)
BILIRUBIN TOTAL: 0.44 mg/dL (ref 0.20–1.20)
BUN: 10.9 mg/dL (ref 7.0–26.0)
CO2: 29 mEq/L (ref 22–29)
CREATININE: 0.8 mg/dL (ref 0.6–1.1)
Calcium: 9.5 mg/dL (ref 8.4–10.4)
Chloride: 104 mEq/L (ref 98–109)
EGFR: 79 mL/min/{1.73_m2} — ABNORMAL LOW (ref >90)
Glucose: 85 mg/dl (ref 70–140)
POTASSIUM: 4.3 meq/L (ref 3.5–5.1)
Sodium: 144 mEq/L (ref 136–145)
Total Protein: 7.2 g/dL (ref 6.4–8.3)

## 2014-09-15 LAB — CBC WITH DIFFERENTIAL/PLATELET
BASO%: 0 % (ref 0.0–2.0)
BASOS ABS: 0 10*3/uL (ref 0.0–0.1)
EOS%: 3.7 % (ref 0.0–7.0)
Eosinophils Absolute: 0.2 10*3/uL (ref 0.0–0.5)
HEMATOCRIT: 34.1 % — AB (ref 34.8–46.6)
HEMOGLOBIN: 11 g/dL — AB (ref 11.6–15.9)
LYMPH%: 17.1 % (ref 14.0–49.7)
MCH: 29.5 pg (ref 25.1–34.0)
MCHC: 32.3 g/dL (ref 31.5–36.0)
MCV: 91.4 fL (ref 79.5–101.0)
MONO#: 0.6 10*3/uL (ref 0.1–0.9)
MONO%: 9.1 % (ref 0.0–14.0)
NEUT%: 70.1 % (ref 38.4–76.8)
NEUTROS ABS: 4.6 10*3/uL (ref 1.5–6.5)
PLATELETS: 211 10*3/uL (ref 145–400)
RBC: 3.73 10*6/uL (ref 3.70–5.45)
RDW: 13.1 % (ref 11.2–14.5)
WBC: 6.5 10*3/uL (ref 3.9–10.3)
lymph#: 1.1 10*3/uL (ref 0.9–3.3)

## 2014-09-22 ENCOUNTER — Ambulatory Visit (HOSPITAL_BASED_OUTPATIENT_CLINIC_OR_DEPARTMENT_OTHER): Payer: Medicare HMO | Admitting: Oncology

## 2014-09-22 ENCOUNTER — Telehealth: Payer: Self-pay | Admitting: Oncology

## 2014-09-22 VITALS — BP 153/69 | HR 62 | Temp 97.4°F | Resp 18 | Ht 65.0 in | Wt 141.3 lb

## 2014-09-22 DIAGNOSIS — Z170 Estrogen receptor positive status [ER+]: Secondary | ICD-10-CM | POA: Diagnosis not present

## 2014-09-22 DIAGNOSIS — C50412 Malignant neoplasm of upper-outer quadrant of left female breast: Secondary | ICD-10-CM

## 2014-09-22 NOTE — Telephone Encounter (Signed)
Appointments made and avs printed for patient °

## 2014-09-22 NOTE — Progress Notes (Signed)
[facility]  [phone] [phone]) [phone]     ID: Kristi Carr OB: Jun 26, 1945  MR#: [id number]  GUR#:[id number]  PCP: Elsie Stain, MD GYN: Paula Compton  SU: Rolm Bookbinder OTHER MD: Gery Pray  CHIEF COMPLAINT: Estrogen receptor positive breast cancer  CURRENT THERAPY:  tamoxifen  BREAST CANCER HISTORY:  from the original intake note:  Kristi Carr had routine screening mammography at [facility] 07/16/2013. This suggested a mass at the 1:00 position in the left breast. Ultrasound of the left breast and axilla 07/23/2013 showed a 1.7 cm lobulated mass at the 2:00 position, which was hypoechoic. The left axilla was unremarkable.  Biopsy of the mass in question 08/02/2013 showed ([id number]) and invasive ductal (papillary) carcinoma, grade 1, estrogen receptor and progesterone receptor both 100% positive with strong staining intensity, with an MIB-1 of 9% and no HER-2 amplification, the signals ratio being 1.08 and date number per cell 1.95.  Her subsequent history is as detailed below  INTERVAL HISTORY: Kristi Carr returns today for followup of her estrogen receptor positive breast cancer.  She continues on tamoxifen, with excellent tolerance. She has a rare hot flash. She does have some vaginal itching, which is likely not related to this medication  REVIEW OF SYSTEMS: Kristi Carr  Has nocturia 1. She keeps her grandchildren over the weekend frequently and also sees them on Wednesdays. This "keeps me on my toes". She keeps a garden as well. She does not otherwise exercise. A detailed review of systems today was otherwise stable  PAST MEDICAL HISTORY: Past Medical History  Diagnosis Date  . Gallstones 08/23/03    x 2  . Anemia   . Hypertension   . Hot flashes   . Wears glasses   . Breast cancer of upper-outer quadrant of left female breast   . Kidney stones   . Radiation 10/14/13-12/02/13    left breast 50.4 gray, lumpectomy cavity boosted to 63 gray    PAST  SURGICAL HISTORY: Past Surgical History  Procedure Laterality Date  . Cholecystectomy    . Wisdom tooth extraction    . Partial hysterectomy  1980    ovaries left in  . Tonsillectomy    . Tubal ligation    . Colonoscopy    . Breast lumpectomy with sentinel lymph node biopsy  09/06/13    LEFT BREAST SEED GUIDED LUMPECTOMY WITH LEFT AXILLARY SENTINEL NODE BIOPSY (Left)    FAMILY HISTORY Family History  Problem Relation Age of Onset  . Diabetes Mother   . Hyperlipidemia Mother   . Hypertension Mother   . Cancer Mother     Breast   . Breast cancer Mother   . Heart attack Father   . Heart disease Father   . Depression Neg Hx   . Alcohol abuse Neg Hx   . Drug abuse Neg Hx   . Colon cancer Neg Hx   . Uterine cancer Neg Hx   . Ovarian cancer Neg Hx   . Prostate cancer Neg Hx   . Hypertension Sister   . Heart disease Brother     CHF  EF 10%  . Hypertension Brother   . Gout Brother    the patient's father died from a myocardial infarction at age 50. The patient's mother died from intestinal blockage at age 8. The patient had one brother, one sister. The patient's mother was diagnosed with breast cancer at age 39. There is no other history of cancer in the family to her knowledge.  GYNECOLOGIC HISTORY:  Menarche age 68, first live birth age 20, the patient is Kristi Carr P2. She underwent hysterectomy in 1979 but her ovaries are still in place. She did not use hormone replacement.  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 Her husband Kristi Carr used to work for [employer]. Son Kristi Carr lives in [place] where he works as a Librarian, academic for [employer]. Son Kristi Carr lives in [place] as Government social research officer for [employer] and environmental services. The patient has 4 grandchildren. She is a Psychologist, forensic     ADVANCED DIRECTIVES: Not in place   HEALTH MAINTENANCE: History  Substance Use Topics  . Smoking status: Never Smoker   . Smokeless tobacco: Never Used  . Alcohol Use: No      Colonoscopy: 2011  PAP: Status post hysterectomy  Bone density: Remote; showed osteopenia  Lipid panel:  No Known Allergies  Current Outpatient Prescriptions  Medication Sig Dispense Refill  . Calcium Carbonate-Vitamin D 600-400 MG-UNIT per tablet Take 1 tablet by mouth daily.      . cholestyramine (QUESTRAN) 4 G packet TAKE 0.5 PACKETS (2 G TOTAL) BY MOUTH 2 (TWO) TIMES DAILY AS NEEDED (LOOSE STOOLS). 60 packet 12  . clobetasol cream (TEMOVATE) 0.05 %   0  . co-enzyme Q-10 30 MG capsule Take 30 mg by mouth daily.    . ergocalciferol (VITAMIN D2) 50000 UNITS capsule Take 50,000 Units by mouth every 30 (thirty) days.    Marland Kitchen ibuprofen (GNP IBUPROFEN) 200 MG tablet Take 200 mg by mouth as needed.      . magnesium 30 MG tablet Take 30 mg by mouth daily.      . metoprolol succinate (TOPROL-XL) 25 MG 24 hr tablet Take 1 tablet (25 mg total) by mouth 2 (two) times daily. 180 tablet 3  . multivitamin (THERAGRAN) per tablet Take 1 tablet by mouth daily.      . Omega-3 Fatty Acids (FISH OIL) 1000 MG CPDR Take by mouth daily.      . tamoxifen (NOLVADEX) 20 MG tablet Take 20 mg by mouth daily.  12   No current facility-administered medications for this visit.    OBJECTIVE: Middle-aged white woman who appears well Filed Vitals:   09/22/14 1015  BP: 153/69  Pulse: 62  Temp: 97.4 F (36.3 C)  Resp: 18     Body mass index is 23.51 kg/(m^2).    ECOG FS:0 - Asymptomatic  Sclerae unicteric,  Pupils round and equal Oropharynx clear mean and moist No cervical or supraclavicular adenopathy Lungs no rales or rhonchi Heart regular rate and rhythm Abd soft, nontender, positive bowel sounds MSK no focal spinal tenderness, no upper extremity lymphedema Neuro: nonfocal, well oriented, positive affect Breasts: The right breast is unremarkable. The left breast is status post lumpectomy and radiation. There is no evidence of local recurrence. The left axilla is benign   LAB RESULTS:  CMP      Component Value Date/Time   NA 144 09/15/2014 0947   NA 144 10/27/2012 0900   K 4.3 09/15/2014 0947   K 4.5 10/27/2012 0900   CL 106 10/27/2012 0900   CO2 29 09/15/2014 0947   CO2 27 10/27/2012 0900   GLUCOSE 85 09/15/2014 0947   GLUCOSE 91 10/27/2012 0900   BUN 10.9 09/15/2014 0947   BUN 16 10/27/2012 0900   CREATININE 0.8 09/15/2014 0947   CREATININE 0.8 10/27/2012 0900   CALCIUM 9.5 09/15/2014 0947   CALCIUM 9.5 10/27/2012 0900   PROT 7.2 09/15/2014 0947  PROT 8.0 10/27/2012 0900   ALBUMIN 3.5 09/15/2014 0947   ALBUMIN 4.1 10/27/2012 0900   AST 19 09/15/2014 0947   AST 15 10/27/2012 0900   ALT 14 09/15/2014 0947   ALT 11 10/27/2012 0900   ALKPHOS 54 09/15/2014 0947   ALKPHOS 67 10/27/2012 0900   BILITOT 0.44 09/15/2014 0947   BILITOT 1.0 10/27/2012 0900   GFRNONAA 90 06/22/2008 0958   GFRAA 109 06/22/2008 0958    I No results found for: SPEP  Lab Results  Component Value Date   WBC 6.5 09/15/2014   NEUTROABS 4.6 09/15/2014   HGB 11.0* 09/15/2014   HCT 34.1* 09/15/2014   MCV 91.4 09/15/2014   PLT 211 09/15/2014      Chemistry      Component Value Date/Time   NA 144 09/15/2014 0947   NA 144 10/27/2012 0900   K 4.3 09/15/2014 0947   K 4.5 10/27/2012 0900   CL 106 10/27/2012 0900   CO2 29 09/15/2014 0947   CO2 27 10/27/2012 0900   BUN 10.9 09/15/2014 0947   BUN 16 10/27/2012 0900   CREATININE 0.8 09/15/2014 0947   CREATININE 0.8 10/27/2012 0900      Component Value Date/Time   CALCIUM 9.5 09/15/2014 0947   CALCIUM 9.5 10/27/2012 0900   ALKPHOS 54 09/15/2014 0947   ALKPHOS 67 10/27/2012 0900   AST 19 09/15/2014 0947   AST 15 10/27/2012 0900   ALT 14 09/15/2014 0947   ALT 11 10/27/2012 0900   BILITOT 0.44 09/15/2014 0947   BILITOT 1.0 10/27/2012 0900       No results found for: LABCA2  No components found for: [id number]  No results for input(s): INR in the last 168 hours.  Urinalysis No results found for: COLORURINE  STUDIES:   mammography February 2016 at [facility] was unremarkable ; breast density category B  ASSESSMENT: 69 y.o. [place] woman status post left breast biopsy 08/02/2013 for a clinical T2 N0, stage IIA invasive papillary breast cancer, grade 1, estrogen receptor and progesterone receptor both 100% positive, with an MIB-1 of 9% and no HER-2 amplification  (1) Status post left lumpectomy and sentinel lymph node sampling 09/06/2013 for a pT1c pN0, stage IA invasive ductal carcinoma, grade 1, repeat HER-2 again negative  (2) Adjuvant radiation completed 11/30/2013  (3) started tamoxifen 12/18/2013  PLAN: Callyn  Is having significant problems with vaginal dryness. While she is on tamoxifen and it is permissible  As far as I am concerned for her to use local estrogen preparations: Cream, suppositories, or Estring.  She will meet with Dr. Delfino Lovett sun soon, she tells me, and they can discuss it in more detail, but I explained again that as far as I'm concerned, so long as she is on tamoxifen, that is quite safe.  If she does start using local estrogen and once to continue it, it might be a better choice for her to stay on tamoxifen for a full 5 years rather than switching to an aromatase inhibitor after two years   we frequently see patient's like Vela every 6 months for the first 2 years, but she is so stable and so reliable that I feel comfortable seeing her on a once a year basis. Accordingly she will return to see me March 2017, after her February mammography.  Amantha has a good understanding of the overall plan. She agrees to it. She knows the goal of treatment in her case is cure. She will call with any problems  of bit of L before her next visit here.   Chauncey Cruel, MD   09/22/2014 10:45 AM

## 2015-01-30 ENCOUNTER — Other Ambulatory Visit: Payer: Self-pay | Admitting: Family Medicine

## 2015-02-19 ENCOUNTER — Other Ambulatory Visit: Payer: Self-pay | Admitting: Oncology

## 2015-02-21 NOTE — Telephone Encounter (Signed)
Chart reviewed.

## 2015-05-08 ENCOUNTER — Other Ambulatory Visit: Payer: Self-pay | Admitting: Family Medicine

## 2015-05-08 DIAGNOSIS — E559 Vitamin D deficiency, unspecified: Secondary | ICD-10-CM

## 2015-05-08 DIAGNOSIS — I10 Essential (primary) hypertension: Secondary | ICD-10-CM

## 2015-05-16 ENCOUNTER — Other Ambulatory Visit (INDEPENDENT_AMBULATORY_CARE_PROVIDER_SITE_OTHER): Payer: Medicare HMO

## 2015-05-16 DIAGNOSIS — E559 Vitamin D deficiency, unspecified: Secondary | ICD-10-CM | POA: Diagnosis not present

## 2015-05-16 DIAGNOSIS — I10 Essential (primary) hypertension: Secondary | ICD-10-CM | POA: Diagnosis not present

## 2015-05-16 LAB — COMPREHENSIVE METABOLIC PANEL WITH GFR
ALT: 11 U/L (ref 0–35)
AST: 15 U/L (ref 0–37)
Albumin: 3.9 g/dL (ref 3.5–5.2)
Alkaline Phosphatase: 44 U/L (ref 39–117)
BUN: 19 mg/dL (ref 6–23)
CO2: 32 meq/L (ref 19–32)
Calcium: 9.5 mg/dL (ref 8.4–10.5)
Chloride: 103 meq/L (ref 96–112)
Creatinine, Ser: 0.75 mg/dL (ref 0.40–1.20)
GFR: 81.52 mL/min
Glucose, Bld: 92 mg/dL (ref 70–99)
Potassium: 4.3 meq/L (ref 3.5–5.1)
Sodium: 142 meq/L (ref 135–145)
Total Bilirubin: 0.6 mg/dL (ref 0.2–1.2)
Total Protein: 7.1 g/dL (ref 6.0–8.3)

## 2015-05-16 LAB — LIPID PANEL
CHOLESTEROL: 166 mg/dL (ref 0–200)
HDL: 45.6 mg/dL (ref >39.00)
LDL Cholesterol: 87 mg/dL (ref 0–99)
NONHDL: 120.31
Total CHOL/HDL Ratio: 4
Triglycerides: 168 mg/dL — ABNORMAL HIGH (ref 0.0–149.0)
VLDL: 33.6 mg/dL (ref 0.0–40.0)

## 2015-05-16 LAB — VITAMIN D 25 HYDROXY (VIT D DEFICIENCY, FRACTURES): VITD: 35.45 ng/mL (ref 30.00–100.00)

## 2015-05-23 ENCOUNTER — Ambulatory Visit (INDEPENDENT_AMBULATORY_CARE_PROVIDER_SITE_OTHER): Payer: Medicare HMO | Admitting: Family Medicine

## 2015-05-23 ENCOUNTER — Encounter: Payer: Self-pay | Admitting: Family Medicine

## 2015-05-23 VITALS — BP 128/76 | HR 64 | Temp 97.9°F | Ht 65.0 in | Wt 144.2 lb

## 2015-05-23 DIAGNOSIS — Z23 Encounter for immunization: Secondary | ICD-10-CM | POA: Diagnosis not present

## 2015-05-23 DIAGNOSIS — Z0001 Encounter for general adult medical examination with abnormal findings: Secondary | ICD-10-CM

## 2015-05-23 DIAGNOSIS — R197 Diarrhea, unspecified: Secondary | ICD-10-CM | POA: Diagnosis not present

## 2015-05-23 DIAGNOSIS — Z119 Encounter for screening for infectious and parasitic diseases, unspecified: Secondary | ICD-10-CM

## 2015-05-23 DIAGNOSIS — I10 Essential (primary) hypertension: Secondary | ICD-10-CM | POA: Diagnosis not present

## 2015-05-23 DIAGNOSIS — Z0000 Encounter for general adult medical examination without abnormal findings: Secondary | ICD-10-CM

## 2015-05-23 DIAGNOSIS — E559 Vitamin D deficiency, unspecified: Secondary | ICD-10-CM

## 2015-05-23 DIAGNOSIS — Z7189 Other specified counseling: Secondary | ICD-10-CM | POA: Insufficient documentation

## 2015-05-23 MED ORDER — FISH OIL 1000 MG PO CPDR: DELAYED_RELEASE_CAPSULE | ORAL | Status: DC

## 2015-05-23 MED ORDER — METOPROLOL SUCCINATE ER 25 MG PO TB24: 25.0000 mg | ORAL_TABLET | Freq: Two times a day (BID) | ORAL | Status: DC

## 2015-05-23 NOTE — Assessment & Plan Note (Signed)
Flu 2016 Shingles prev done PNA 2016 Tetanus 2012 Colonoscopy 2015 Breast cancer screening up to date, per onc and gyn clinic Advance directive- husband designated if patient were incapacitated.  Cognitive function addressed- see scanned forms- and if abnormal then additional documentation follows.  Pt opts in for HCV screening with next set of labs.  D/w pt re: routine screening.

## 2015-05-23 NOTE — Patient Instructions (Signed)
Recheck in about 1 year, labs ahead of time.  Take care.  Glad to see you.   I'll await the follow up notes from your other docs.

## 2015-05-23 NOTE — Assessment & Plan Note (Signed)
Repletion per gyn, vit D wnl, would continue as is.  Will notify obgyn clinic as FYI re: vit D level.

## 2015-05-23 NOTE — Assessment & Plan Note (Signed)
Controlled, continue as is.  D/w pt.  Doing well.  Labs d/w pt.

## 2015-05-23 NOTE — Assessment & Plan Note (Signed)
Controlled with prn questran.  Likely from bile salt.  D/w pt.  No change in med/plan.

## 2015-05-23 NOTE — Progress Notes (Signed)
Pre visit review using our clinic review tool, if applicable. No additional management support is needed unless otherwise documented below in the visit note.  I have personally reviewed the Medicare Annual Wellness questionnaire and have noted 1. The patient's medical and social history 2. Their use of alcohol, tobacco or illicit drugs 3. Their current medications and supplements 4. The patient's functional ability including ADL's, fall risks, home safety risks and hearing or visual             impairment. 5. Diet and physical activities 6. Evidence for depression or mood disorders  The patients weight, height, BMI have been recorded in the chart and visual acuity is per eye clinic.  I have made referrals, counseling and provided education to the patient based review of the above and I have provided the pt with a written personalized care plan for preventive services.  Provider list updated- see scanned forms.  Routine anticipatory guidance given to patient.  See health maintenance.  Flu 2016 Shingles prev done PNA 2016 Tetanus 2012 Colonoscopy 2015 Breast cancer screening up to date, per onc and gyn clinic Advance directive- husband designated if patient were incapacitated.  Cognitive function addressed- see scanned forms- and if abnormal then additional documentation follows.  Pt opts in for HCV screening with next set of labs.  D/w pt re: routine screening.    Breast cancer s/p tx, per onc and gyn, w/o breast mass or lumps noted by patient.  Not having bothersome hot flashes.  Still on tamoxifen.  No ADE on med.   Bile salt diarrhea controlled with prn questran w/o ADE.   Hypertension:    Using medication without problems or lightheadedness: yes Chest pain with exertion:no Edema:no Short of breath:no Labs d/w pt.   TG up, she had stopped fish oil, she'll restart.  LDL improved.    PMH and SH reviewed  Meds, vitals, and allergies reviewed.   ROS: See HPI.  Otherwise  negative.    GEN: nad, alert and oriented HEENT: mucous membranes moist NECK: supple w/o LA CV: rrr. PULM: ctab, no inc wob ABD: soft, +bs EXT: no edema SKIN: no acute rash

## 2015-06-29 DIAGNOSIS — Z853 Personal history of malignant neoplasm of breast: Secondary | ICD-10-CM | POA: Diagnosis not present

## 2015-07-03 ENCOUNTER — Other Ambulatory Visit: Payer: Self-pay | Admitting: Radiology

## 2015-07-03 DIAGNOSIS — N6032 Fibrosclerosis of left breast: Secondary | ICD-10-CM | POA: Diagnosis not present

## 2015-07-03 DIAGNOSIS — Z0000 Encounter for general adult medical examination without abnormal findings: Secondary | ICD-10-CM | POA: Diagnosis not present

## 2015-07-03 DIAGNOSIS — Z853 Personal history of malignant neoplasm of breast: Secondary | ICD-10-CM | POA: Diagnosis not present

## 2015-07-03 DIAGNOSIS — R921 Mammographic calcification found on diagnostic imaging of breast: Secondary | ICD-10-CM | POA: Diagnosis not present

## 2015-07-19 ENCOUNTER — Other Ambulatory Visit: Payer: Self-pay

## 2015-07-19 DIAGNOSIS — C50412 Malignant neoplasm of upper-outer quadrant of left female breast: Secondary | ICD-10-CM

## 2015-07-20 ENCOUNTER — Other Ambulatory Visit (HOSPITAL_BASED_OUTPATIENT_CLINIC_OR_DEPARTMENT_OTHER): Payer: Medicare HMO

## 2015-07-20 DIAGNOSIS — C50412 Malignant neoplasm of upper-outer quadrant of left female breast: Secondary | ICD-10-CM | POA: Diagnosis not present

## 2015-07-20 LAB — COMPREHENSIVE METABOLIC PANEL
ALBUMIN: 3.7 g/dL (ref 3.5–5.0)
ALT: 14 U/L (ref 0–55)
AST: 18 U/L (ref 5–34)
Alkaline Phosphatase: 49 U/L (ref 40–150)
Anion Gap: 8 mEq/L (ref 3–11)
BILIRUBIN TOTAL: 0.7 mg/dL (ref 0.20–1.20)
BUN: 15.2 mg/dL (ref 7.0–26.0)
CALCIUM: 9.4 mg/dL (ref 8.4–10.4)
CO2: 28 mEq/L (ref 22–29)
Chloride: 106 mEq/L (ref 98–109)
Creatinine: 0.9 mg/dL (ref 0.6–1.1)
EGFR: 69 mL/min/{1.73_m2} — AB (ref >90)
GLUCOSE: 81 mg/dL (ref 70–140)
POTASSIUM: 4.5 meq/L (ref 3.5–5.1)
SODIUM: 142 meq/L (ref 136–145)
TOTAL PROTEIN: 7.5 g/dL (ref 6.4–8.3)

## 2015-07-20 LAB — CBC WITH DIFFERENTIAL/PLATELET
BASO%: 0 % (ref 0.0–2.0)
Basophils Absolute: 0 10*3/uL (ref 0.0–0.1)
EOS ABS: 0.2 10*3/uL (ref 0.0–0.5)
EOS%: 3.9 % (ref 0.0–7.0)
HCT: 35.3 % (ref 34.8–46.6)
HGB: 11.4 g/dL — ABNORMAL LOW (ref 11.6–15.9)
LYMPH#: 1.2 10*3/uL (ref 0.9–3.3)
LYMPH%: 30 % (ref 14.0–49.7)
MCH: 29.5 pg (ref 25.1–34.0)
MCHC: 32.3 g/dL (ref 31.5–36.0)
MCV: 91.5 fL (ref 79.5–101.0)
MONO#: 0.5 10*3/uL (ref 0.1–0.9)
MONO%: 12.3 % (ref 0.0–14.0)
NEUT#: 2.2 10*3/uL (ref 1.5–6.5)
NEUT%: 53.8 % (ref 38.4–76.8)
PLATELETS: 194 10*3/uL (ref 145–400)
RBC: 3.86 10*6/uL (ref 3.70–5.45)
RDW: 13.5 % (ref 11.2–14.5)
WBC: 4.1 10*3/uL (ref 3.9–10.3)

## 2015-07-27 ENCOUNTER — Ambulatory Visit (HOSPITAL_BASED_OUTPATIENT_CLINIC_OR_DEPARTMENT_OTHER): Payer: Medicare HMO | Admitting: Oncology

## 2015-07-27 VITALS — BP 158/77 | HR 70 | Temp 98.5°F | Resp 18 | Ht 65.0 in | Wt 147.5 lb

## 2015-07-27 DIAGNOSIS — Z170 Estrogen receptor positive status [ER+]: Secondary | ICD-10-CM | POA: Diagnosis not present

## 2015-07-27 DIAGNOSIS — C773 Secondary and unspecified malignant neoplasm of axilla and upper limb lymph nodes: Secondary | ICD-10-CM | POA: Diagnosis not present

## 2015-07-27 DIAGNOSIS — C50412 Malignant neoplasm of upper-outer quadrant of left female breast: Secondary | ICD-10-CM

## 2015-07-27 MED ORDER — TAMOXIFEN CITRATE 20 MG PO TABS: 20.0000 mg | ORAL_TABLET | Freq: Every day | ORAL | Status: DC

## 2015-07-27 NOTE — Progress Notes (Signed)
[place]  [phone] [phone]     ID: Kristi Carr OB: [birth year]  MR#: [id number]  OZY#:[id number]  PCP: Kristi Stain, MD GYN: Kristi Carr  SU: Kristi Carr OTHER MD: Kristi Carr  CHIEF COMPLAINT: Estrogen receptor positive breast cancer  CURRENT THERAPY:  tamoxifen  BREAST CANCER HISTORY:  from the original intake note:  Kristi Carr had routine screening mammography at [facility] 07/16/2013. This suggested a mass at the 1:00 position in the left breast. Ultrasound of the left breast and axilla 07/23/2013 showed a 1.7 cm lobulated mass at the 2:00 position, which was hypoechoic. The left axilla was unremarkable.  Biopsy of the mass in question 08/02/2013 showed ([id number]) and invasive ductal (papillary) carcinoma, grade 1, estrogen receptor and progesterone receptor both 100% positive with strong staining intensity, with an MIB-1 of 9% and no HER-2 amplification, the signals ratio being 1.08 and date number per cell 1.95.  Her subsequent history is as detailed below  INTERVAL HISTORY: Kristi Carr returns today for followup of her left-sided breast cancer.  Since her last visit here she had her routine follow-up mammograms which however showed some calcifications in the upper outer quadrant of the left breast. These were biopsied 07/03/2015, and showed ([id number]) no evidence of malignancy  She continues on tamoxifen with excellent tolerance. She has minimal hot flashes. She did participate in our intimacy and pelvic health program and is managing the vaginal dryness much better.  REVIEW OF SYSTEMS: Kristi Carr found a dear take on the posterior left neck at the hairline a few days ago. Her husband removed it. This was also evaluated by her primary care physician, Dr. Damita Carr. She just wanted me at to make sure to look at it today. She has had no fever, no rash, and no aches or pains. She does have mild stress urinary incontinence. Otherwise a detailed  review of systems today was benign  PAST MEDICAL HISTORY: Past Medical History  Diagnosis Date  . Gallstones 08/23/03    x 2  . Anemia   . Hypertension   . Hot flashes   . Wears glasses   . Breast cancer of upper-outer quadrant of left female breast ([place])   . Kidney stones   . Radiation 10/14/13-12/02/13    left breast 50.4 gray, lumpectomy cavity boosted to 63 gray    PAST SURGICAL HISTORY: Past Surgical History  Procedure Laterality Date  . Cholecystectomy    . Wisdom tooth extraction    . Partial hysterectomy  1980    ovaries left in  . Tonsillectomy    . Tubal ligation    . Colonoscopy    . Breast lumpectomy with sentinel lymph node biopsy  09/06/13    LEFT BREAST SEED GUIDED LUMPECTOMY WITH LEFT AXILLARY SENTINEL NODE BIOPSY (Left)    FAMILY HISTORY Family History  Problem Relation Age of Onset  . Diabetes Mother   . Hyperlipidemia Mother   . Hypertension Mother   . Cancer Mother     Breast   . Breast cancer Mother   . Heart attack Father   . Heart disease Father   . Depression Neg Hx   . Alcohol abuse Neg Hx   . Drug abuse Neg Hx   . Colon cancer Neg Hx   . Uterine cancer Neg Hx   . Ovarian cancer Neg Hx   . Prostate cancer Neg Hx   . Hypertension Sister   . Heart disease Brother     CHF  EF  10%  . Hypertension Brother   . Gout Brother    the patient's father died from a myocardial infarction at age 31. The patient's mother died from intestinal blockage at age 14. The patient had one brother, one sister. The patient's mother was diagnosed with breast cancer at age 40. There is no other history of cancer in the family to her knowledge.  GYNECOLOGIC HISTORY:  Menarche age 35, first live birth age 45, the patient is Kristi Carr P2. She underwent hysterectomy in 1979 but her ovaries are still in place. She did not use hormone replacement.  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 Her husband Kristi Carr used to work for [employer]. Son Kristi Carr lives in [place]  where he works as a Librarian, academic for [employer]. Son Kristi Carr lives in [place] as Government social research officer for [employer] and environmental services. The patient has 4 grandchildren. She is a Psychologist, forensic     ADVANCED DIRECTIVES: Not in place   HEALTH MAINTENANCE: Social History  Substance Use Topics  . Smoking status: Never Smoker   . Smokeless tobacco: Never Used  . Alcohol Use: No     Colonoscopy: 2011  PAP: Status post hysterectomy  Bone density: Remote; showed osteopenia  Lipid panel:  No Known Allergies  Current Outpatient Prescriptions  Medication Sig Dispense Refill  . Calcium Carbonate-Vitamin D 600-400 MG-UNIT per tablet Take 1 tablet by mouth daily.      . cholestyramine (QUESTRAN) 4 G packet TAKE 0.5 PACKETS (2 G TOTAL) BY MOUTH 2 (TWO) TIMES DAILY AS NEEDED (LOOSE STOOLS). 60 packet 12  . clobetasol cream (TEMOVATE) 0.05 %   0  . co-enzyme Q-10 30 MG capsule Take 100 mg by mouth daily.    . ergocalciferol (VITAMIN D2) 50000 UNITS capsule Take 50,000 Units by mouth every 30 (thirty) days.    Marland Kitchen ibuprofen (GNP IBUPROFEN) 200 MG tablet Take 200 mg by mouth as needed.      . magnesium 30 MG tablet Take 30 mg by mouth daily.      . metoprolol succinate (TOPROL-XL) 25 MG 24 hr tablet Take 1 tablet (25 mg total) by mouth 2 (two) times daily. 180 tablet 3  . multivitamin (THERAGRAN) per tablet Take 1 tablet by mouth daily.      . Omega-3 Fatty Acids (FISH OIL) 1000 MG CPDR 1 by mouth daily    . tamoxifen (NOLVADEX) 20 MG tablet TAKE 1 TABLET BY MOUTH ONCE DAILY. 90 tablet 4   No current facility-administered medications for this visit.    OBJECTIVE: Middle-aged white woman In no acute distress Filed Vitals:   07/27/15 1103  BP: 158/77  Pulse: 70  Temp: 98.5 F (36.9 C)  Resp: 18     Body mass index is 24.55 kg/(m^2).    ECOG FS:0 - Asymptomatic  Sclerae unicteric, EOMs intact Oropharynx clear, dentition in good repair No cervical or supraclavicular  adenopathy Lungs no rales or rhonchi Heart regular rate and rhythm Abd soft, nontender, positive bowel sounds MSK no focal spinal tenderness, no upper extremity lymphedema Neuro: nonfocal, well oriented, appropriate affect Breasts: The right breast shows no suspicious findings. The left breast is status post lumpectomy and radiation, and also status post recent biopsy there is no evidence of disease recurrence. The left axilla is benign. Skin: The site of the tick bite at the left posterior hairline shows a small circle of erythema measuring about 1-1/2 cm with a teeny eschar in the middle. There is no "target  lesion"   LAB RESULTS:  CMP     Component Value Date/Time   NA 142 07/20/2015 0944   NA 142 05/16/2015 1018   K 4.5 07/20/2015 0944   K 4.3 05/16/2015 1018   CL 103 05/16/2015 1018   CO2 28 07/20/2015 0944   CO2 32 05/16/2015 1018   GLUCOSE 81 07/20/2015 0944   GLUCOSE 92 05/16/2015 1018   BUN 15.2 07/20/2015 0944   BUN 19 05/16/2015 1018   CREATININE 0.9 07/20/2015 0944   CREATININE 0.75 05/16/2015 1018   CALCIUM 9.4 07/20/2015 0944   CALCIUM 9.5 05/16/2015 1018   PROT 7.5 07/20/2015 0944   PROT 7.1 05/16/2015 1018   ALBUMIN 3.7 07/20/2015 0944   ALBUMIN 3.9 05/16/2015 1018   AST 18 07/20/2015 0944   AST 15 05/16/2015 1018   ALT 14 07/20/2015 0944   ALT 11 05/16/2015 1018   ALKPHOS 49 07/20/2015 0944   ALKPHOS 44 05/16/2015 1018   BILITOT 0.70 07/20/2015 0944   BILITOT 0.6 05/16/2015 1018   GFRNONAA 90 06/22/2008 0958   GFRAA 109 06/22/2008 0958    I No results found for: SPEP  Lab Results  Component Value Date   WBC 4.1 07/20/2015   NEUTROABS 2.2 07/20/2015   HGB 11.4* 07/20/2015   HCT 35.3 07/20/2015   MCV 91.5 07/20/2015   PLT 194 07/20/2015      Chemistry      Component Value Date/Time   NA 142 07/20/2015 0944   NA 142 05/16/2015 1018   K 4.5 07/20/2015 0944   K 4.3 05/16/2015 1018   CL 103 05/16/2015 1018   CO2 28 07/20/2015 0944   CO2  32 05/16/2015 1018   BUN 15.2 07/20/2015 0944   BUN 19 05/16/2015 1018   CREATININE 0.9 07/20/2015 0944   CREATININE 0.75 05/16/2015 1018      Component Value Date/Time   CALCIUM 9.4 07/20/2015 0944   CALCIUM 9.5 05/16/2015 1018   ALKPHOS 49 07/20/2015 0944   ALKPHOS 44 05/16/2015 1018   AST 18 07/20/2015 0944   AST 15 05/16/2015 1018   ALT 14 07/20/2015 0944   ALT 11 05/16/2015 1018   BILITOT 0.70 07/20/2015 0944   BILITOT 0.6 05/16/2015 1018       No results found for: LABCA2  No components found for: LABCA125  No results for input(s): INR in the last 168 hours.  Urinalysis No results found for: COLORURINE  STUDIES: No results found.  ASSESSMENT: 70 y.o. [place] woman status post left breast biopsy 08/02/2013 for a clinical T2 N0, stage IIA invasive papillary breast cancer, grade 1, estrogen receptor and progesterone receptor both 100% positive, with an MIB-1 of 9% and no HER-2 amplification  (1) Status post left lumpectomy and sentinel lymph node sampling 09/06/2013 for a pT1c pN0, stage IA invasive ductal carcinoma, grade 1, repeat HER-2 again negative  (2) Adjuvant radiation completed 11/30/2013  (3) started tamoxifen 12/18/2013  (4) deer tick bite February 2017  PLAN: Alajiah is now nearly 2 years out from her definitive surgery with no evidence of disease recurrence. This is very favorable.  The plan is to continue the tamoxifen for a total of 5 years. She is doing much better on the vaginal dryness issue. She is not interested in adding local estrogen preparations although she knows that would be comfortable if she did.  We discussed her tick bite. Apparently she had her husband are frequently needs an since they spend a lot of time outdoors and  there are deer everywhere". I don't think this warrants doxycycline. I did ask her to have her husband take a look at that area over the next few days and tell us if a rash develops.  Otherwise she will return to  see me in one year. She knows to call for any problems that may develop before that visit.     Chauncey Cruel, MD   07/27/2015 11:10 AM

## 2015-07-28 ENCOUNTER — Telehealth: Payer: Self-pay | Admitting: Oncology

## 2015-07-28 NOTE — Telephone Encounter (Signed)
lvm for pt regarding to march appt... °

## 2015-09-20 DIAGNOSIS — C50919 Malignant neoplasm of unspecified site of unspecified female breast: Secondary | ICD-10-CM | POA: Diagnosis not present

## 2015-09-20 DIAGNOSIS — Z0000 Encounter for general adult medical examination without abnormal findings: Secondary | ICD-10-CM | POA: Diagnosis not present

## 2015-09-20 DIAGNOSIS — I10 Essential (primary) hypertension: Secondary | ICD-10-CM | POA: Diagnosis not present

## 2015-11-13 DIAGNOSIS — Z0100 Encounter for examination of eyes and vision without abnormal findings: Secondary | ICD-10-CM | POA: Diagnosis not present

## 2015-11-13 DIAGNOSIS — H524 Presbyopia: Secondary | ICD-10-CM | POA: Diagnosis not present

## 2015-11-28 ENCOUNTER — Other Ambulatory Visit: Payer: Self-pay | Admitting: Family Medicine

## 2015-12-19 DIAGNOSIS — N8110 Cystocele, unspecified: Secondary | ICD-10-CM | POA: Diagnosis not present

## 2016-01-31 DIAGNOSIS — Z01419 Encounter for gynecological examination (general) (routine) without abnormal findings: Secondary | ICD-10-CM | POA: Diagnosis not present

## 2016-01-31 DIAGNOSIS — Z6824 Body mass index (BMI) 24.0-24.9, adult: Secondary | ICD-10-CM | POA: Diagnosis not present

## 2016-01-31 DIAGNOSIS — N8110 Cystocele, unspecified: Secondary | ICD-10-CM | POA: Diagnosis not present

## 2016-01-31 DIAGNOSIS — Z1389 Encounter for screening for other disorder: Secondary | ICD-10-CM | POA: Diagnosis not present

## 2016-01-31 DIAGNOSIS — L900 Lichen sclerosus et atrophicus: Secondary | ICD-10-CM | POA: Diagnosis not present

## 2016-02-14 DIAGNOSIS — N8110 Cystocele, unspecified: Secondary | ICD-10-CM | POA: Diagnosis not present

## 2016-02-14 DIAGNOSIS — Z960 Presence of urogenital implants: Secondary | ICD-10-CM | POA: Diagnosis not present

## 2016-04-05 DIAGNOSIS — R69 Illness, unspecified: Secondary | ICD-10-CM | POA: Diagnosis not present

## 2016-05-18 ENCOUNTER — Other Ambulatory Visit: Payer: Self-pay | Admitting: Oncology

## 2016-05-22 ENCOUNTER — Ambulatory Visit (INDEPENDENT_AMBULATORY_CARE_PROVIDER_SITE_OTHER): Payer: Medicare HMO | Admitting: Family Medicine

## 2016-05-22 ENCOUNTER — Encounter: Payer: Self-pay | Admitting: Family Medicine

## 2016-05-22 VITALS — BP 158/88 | HR 71 | Temp 98.7°F | Wt 148.5 lb

## 2016-05-22 DIAGNOSIS — H6990 Unspecified Eustachian tube disorder, unspecified ear: Secondary | ICD-10-CM | POA: Insufficient documentation

## 2016-05-22 DIAGNOSIS — H6981 Other specified disorders of Eustachian tube, right ear: Secondary | ICD-10-CM | POA: Diagnosis not present

## 2016-05-22 DIAGNOSIS — H6980 Other specified disorders of Eustachian tube, unspecified ear: Secondary | ICD-10-CM | POA: Insufficient documentation

## 2016-05-22 MED ORDER — FLUTICASONE PROPIONATE 50 MCG/ACT NA SUSP: 2.0000 | Freq: Every day | NASAL | 1 refills | Status: DC

## 2016-05-22 NOTE — Patient Instructions (Signed)
Use flonase and gently try to pop your ears.  Take care.  Glad to see you.  Update me as needed.

## 2016-05-22 NOTE — Progress Notes (Signed)
Pre visit review using our clinic review tool, if applicable. No additional management support is needed unless otherwise documented below in the visit note. 

## 2016-05-22 NOTE — Assessment & Plan Note (Signed)
Likely diagnosis. Discussed with patient. Gently try to pop her ears with Valsalva. Use Flonase. Should gradually resolve. Update me as needed.

## 2016-05-22 NOTE — Progress Notes (Signed)
R ear pressure off and on for a few weeks.  With sneezing, coughing, bending over.  Hearing can be affected.  Some R nasal congestion/runny.  No FCNAVD.  No ear ringing or vertigo.    Meds, vitals, and allergies reviewed.   ROS: Per HPI unless specifically indicated in ROS section   nad ncat TM wnl B but R ETD noted on valsalva Nasal exam stuffy, OP wnl, MMM Sinuses not ttp Neck supple, no LA rrr ctab

## 2016-06-29 ENCOUNTER — Other Ambulatory Visit: Payer: Self-pay | Admitting: Family Medicine

## 2016-07-02 DIAGNOSIS — M8589 Other specified disorders of bone density and structure, multiple sites: Secondary | ICD-10-CM | POA: Diagnosis not present

## 2016-07-02 DIAGNOSIS — Z853 Personal history of malignant neoplasm of breast: Secondary | ICD-10-CM | POA: Diagnosis not present

## 2016-07-02 LAB — HM MAMMOGRAPHY

## 2016-07-05 DIAGNOSIS — R102 Pelvic and perineal pain: Secondary | ICD-10-CM | POA: Diagnosis not present

## 2016-07-05 DIAGNOSIS — R3915 Urgency of urination: Secondary | ICD-10-CM | POA: Diagnosis not present

## 2016-07-20 DIAGNOSIS — I10 Essential (primary) hypertension: Secondary | ICD-10-CM | POA: Diagnosis not present

## 2016-07-20 DIAGNOSIS — H259 Unspecified age-related cataract: Secondary | ICD-10-CM | POA: Diagnosis not present

## 2016-07-20 DIAGNOSIS — Z0000 Encounter for general adult medical examination without abnormal findings: Secondary | ICD-10-CM | POA: Diagnosis not present

## 2016-07-20 DIAGNOSIS — D0592 Unspecified type of carcinoma in situ of left breast: Secondary | ICD-10-CM | POA: Diagnosis not present

## 2016-07-20 DIAGNOSIS — N3281 Overactive bladder: Secondary | ICD-10-CM | POA: Diagnosis not present

## 2016-07-20 DIAGNOSIS — E559 Vitamin D deficiency, unspecified: Secondary | ICD-10-CM | POA: Diagnosis not present

## 2016-07-31 ENCOUNTER — Other Ambulatory Visit: Payer: Self-pay | Admitting: Adult Health

## 2016-07-31 DIAGNOSIS — C50412 Malignant neoplasm of upper-outer quadrant of left female breast: Secondary | ICD-10-CM

## 2016-08-01 ENCOUNTER — Other Ambulatory Visit (HOSPITAL_BASED_OUTPATIENT_CLINIC_OR_DEPARTMENT_OTHER): Payer: Medicare HMO

## 2016-08-01 ENCOUNTER — Ambulatory Visit (HOSPITAL_BASED_OUTPATIENT_CLINIC_OR_DEPARTMENT_OTHER): Payer: Medicare HMO | Admitting: Oncology

## 2016-08-01 VITALS — BP 165/86 | HR 73 | Temp 97.4°F | Resp 18 | Ht 65.0 in | Wt 146.7 lb

## 2016-08-01 DIAGNOSIS — Z170 Estrogen receptor positive status [ER+]: Secondary | ICD-10-CM

## 2016-08-01 DIAGNOSIS — Z79810 Long term (current) use of selective estrogen receptor modulators (SERMs): Secondary | ICD-10-CM | POA: Diagnosis not present

## 2016-08-01 DIAGNOSIS — C50412 Malignant neoplasm of upper-outer quadrant of left female breast: Secondary | ICD-10-CM | POA: Diagnosis not present

## 2016-08-01 LAB — CBC WITH DIFFERENTIAL/PLATELET
BASO%: 0.4 % (ref 0.0–2.0)
BASOS ABS: 0 10*3/uL (ref 0.0–0.1)
EOS%: 3.2 % (ref 0.0–7.0)
Eosinophils Absolute: 0.1 10*3/uL (ref 0.0–0.5)
HCT: 30.1 % — ABNORMAL LOW (ref 34.8–46.6)
HGB: 10 g/dL — ABNORMAL LOW (ref 11.6–15.9)
LYMPH%: 21.8 % (ref 14.0–49.7)
MCH: 29.9 pg (ref 25.1–34.0)
MCHC: 33.2 g/dL (ref 31.5–36.0)
MCV: 90.2 fL (ref 79.5–101.0)
MONO#: 0.5 10*3/uL (ref 0.1–0.9)
MONO%: 11.9 % (ref 0.0–14.0)
NEUT#: 2.6 10*3/uL (ref 1.5–6.5)
NEUT%: 62.7 % (ref 38.4–76.8)
Platelets: 239 10*3/uL (ref 145–400)
RBC: 3.34 10*6/uL — AB (ref 3.70–5.45)
RDW: 13.5 % (ref 11.2–14.5)
WBC: 4.1 10*3/uL (ref 3.9–10.3)
lymph#: 0.9 10*3/uL (ref 0.9–3.3)

## 2016-08-01 LAB — COMPREHENSIVE METABOLIC PANEL
ALT: 13 U/L (ref 0–55)
ANION GAP: 8 meq/L (ref 3–11)
AST: 18 U/L (ref 5–34)
Albumin: 3.6 g/dL (ref 3.5–5.0)
Alkaline Phosphatase: 45 U/L (ref 40–150)
BILIRUBIN TOTAL: 0.44 mg/dL (ref 0.20–1.20)
BUN: 13.6 mg/dL (ref 7.0–26.0)
CALCIUM: 9.5 mg/dL (ref 8.4–10.4)
CO2: 29 meq/L (ref 22–29)
CREATININE: 0.8 mg/dL (ref 0.6–1.1)
Chloride: 106 mEq/L (ref 98–109)
EGFR: 75 mL/min/{1.73_m2} — ABNORMAL LOW (ref >90)
Glucose: 77 mg/dl (ref 70–140)
Potassium: 3.8 mEq/L (ref 3.5–5.1)
Sodium: 143 mEq/L (ref 136–145)
TOTAL PROTEIN: 7 g/dL (ref 6.4–8.3)

## 2016-08-01 MED ORDER — TAMOXIFEN CITRATE 20 MG PO TABS: 20.0000 mg | ORAL_TABLET | Freq: Every day | ORAL | 4 refills | Status: DC

## 2016-08-01 NOTE — Progress Notes (Signed)
[place]  [phone] [phone]) [phone]     ID: Kristi Carr OB: 06-13-1945  MR#: [id number]  IWO#:[id number]  PCP: Elsie Stain, MD GYN: Paula Compton  SU: Rolm Bookbinder OTHER MD: Gery Pray  CHIEF COMPLAINT: Estrogen receptor positive breast cancer  CURRENT THERAPY:  tamoxifen  BREAST CANCER HISTORY:  from the original intake note:  Kristi Carr had routine screening mammography at [facility] 07/16/2013. This suggested a mass at the 1:00 position in the left breast. Ultrasound of the left breast and axilla 07/23/2013 showed a 1.7 cm lobulated mass at the 2:00 position, which was hypoechoic. The left axilla was unremarkable.  Biopsy of the mass in question 08/02/2013 showed ([id number]) and invasive ductal (papillary) carcinoma, grade 1, estrogen receptor and progesterone receptor both 100% positive with strong staining intensity, with an MIB-1 of 9% and no HER-2 amplification, the signals ratio being 1.08 and date number per cell 1.95.  Her subsequent history is as detailed below  INTERVAL HISTORY: Kristi Carr returns today for follow-up of her estrogen receptor positive breast cancer. She continues to tolerate the tamoxifen well, with only occasional hot flashes and very minimal vaginal discharge. She obtains it at a good price.  REVIEW OF SYSTEMS: Kristi Carr is exercising 2-3 times a week and enjoying it. She has stress urinary incontinence which is however stable. A detailed review of systems today was otherwise noncontributory  PAST MEDICAL HISTORY: Past Medical History:  Diagnosis Date  . Anemia   . Breast cancer of upper-outer quadrant of left female breast ([place])   . Gallstones 08/23/03   x 2  . Hot flashes   . Hypertension   . Kidney stones   . Radiation 10/14/13-12/02/13   left breast 50.4 gray, lumpectomy cavity boosted to 63 gray  . Wears glasses     PAST SURGICAL HISTORY: Past Surgical History:  Procedure Laterality Date  . BREAST  LUMPECTOMY WITH SENTINEL LYMPH NODE BIOPSY  09/06/13   LEFT BREAST SEED GUIDED LUMPECTOMY WITH LEFT AXILLARY SENTINEL NODE BIOPSY (Left)  . CHOLECYSTECTOMY    . COLONOSCOPY    . PARTIAL HYSTERECTOMY  1980   ovaries left in  . TONSILLECTOMY    . TUBAL LIGATION    . WISDOM TOOTH EXTRACTION      FAMILY HISTORY Family History  Problem Relation Age of Onset  . Diabetes Mother   . Hyperlipidemia Mother   . Hypertension Mother   . Cancer Mother     Breast   . Breast cancer Mother   . Heart attack Father   . Heart disease Father   . Depression Neg Hx   . Alcohol abuse Neg Hx   . Drug abuse Neg Hx   . Colon cancer Neg Hx   . Uterine cancer Neg Hx   . Ovarian cancer Neg Hx   . Prostate cancer Neg Hx   . Hypertension Sister   . Heart disease Brother     CHF  EF 10%  . Hypertension Brother   . Gout Brother    the patient's father died from a myocardial infarction at age 12. The patient's mother died from intestinal blockage at age 95. The patient had one brother, one sister. The patient's mother was diagnosed with breast cancer at age 46. There is no other history of cancer in the family to her knowledge.  GYNECOLOGIC HISTORY:  Menarche age 42, first live birth age 73, the patient is Kristi Carr P2. She underwent hysterectomy in 1979 but her ovaries are still  in place. She did not use hormone replacement.  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 Her husband Jenny Reichmann used to work for [employer]. Son Shanon Brow lives in [place] where he works as a Librarian, academic for [employer]. Son Randall Hiss lives in [place] as Government social research officer for [employer] and environmental services. The patient has 4 grandchildren. She is a Psychologist, forensic     ADVANCED DIRECTIVES: Not in place   HEALTH MAINTENANCE: Social History  Substance Use Topics  . Smoking status: Never Smoker  . Smokeless tobacco: Never Used  . Alcohol use No     Colonoscopy: 2011  PAP: Status post hysterectomy  Bone density: Remote;  showed osteopenia  Lipid panel:  No Known Allergies  Current Outpatient Prescriptions  Medication Sig Dispense Refill  . cholestyramine (QUESTRAN) 4 g packet TAKE 1/2 PACKET 2 TIMES A DAY AS NEEDED(LOOSE STOOL) 60 packet 1  . clobetasol cream (TEMOVATE) 0.05 %   0  . co-enzyme Q-10 30 MG capsule Take 100 mg by mouth daily.    . ergocalciferol (VITAMIN D2) 50000 UNITS capsule Take 50,000 Units by mouth every 30 (thirty) days.    . fluticasone (FLONASE) 50 MCG/ACT nasal spray Place 2 sprays into both nostrils daily. 16 g 1  . ibuprofen (GNP IBUPROFEN) 200 MG tablet Take 200 mg by mouth as needed.      . metoprolol succinate (TOPROL-XL) 25 MG 24 hr tablet TAKE 1 TABLET (25 MG TOTAL) BY MOUTH 2 (TWO) TIMES DAILY. 180 tablet 0  . multivitamin (THERAGRAN) per tablet Take 1 tablet by mouth daily.      . Omega-3 Fatty Acids (FISH OIL) 1000 MG CPDR 1 by mouth daily    . tamoxifen (NOLVADEX) 20 MG tablet TAKE 1 TABLET BY MOUTH ONCE DAILY. 90 tablet 2   No current facility-administered medications for this visit.     OBJECTIVE: Middle-aged white woman  Who appears well  Vitals:   08/01/16 1033  BP: (!) 165/86  Pulse: 73  Resp: 18  Temp: 97.4 F (36.3 C)     Body mass index is 24.41 kg/m.    ECOG FS:0 - Asymptomatic  Sclerae unicteric, pupils round and equal Oropharynx clear and moist-- no thrush or other lesions No cervical or supraclavicular adenopathy Lungs no rales or rhonchi Heart regular rate and rhythm Abd soft, nontender, positive bowel sounds MSK no focal spinal tenderness, no upper extremity lymphedema Neuro: nonfocal, well oriented, appropriate affect Breasts: The right breast is benign. The left breast has undergone lumpectomy as well as radiation, with no evidence of local recurrence. Both axilla are benign.  LAB RESULTS:  CMP     Component Value Date/Time   NA 142 07/20/2015 0944   K 4.5 07/20/2015 0944   CL 103 05/16/2015 1018   CO2 28 07/20/2015 0944   GLUCOSE  81 07/20/2015 0944   BUN 15.2 07/20/2015 0944   CREATININE 0.9 07/20/2015 0944   CALCIUM 9.4 07/20/2015 0944   PROT 7.5 07/20/2015 0944   ALBUMIN 3.7 07/20/2015 0944   AST 18 07/20/2015 0944   ALT 14 07/20/2015 0944   ALKPHOS 49 07/20/2015 0944   BILITOT 0.70 07/20/2015 0944   GFRNONAA 90 06/22/2008 0958   GFRAA 109 06/22/2008 0958    I No results found for: SPEP  Lab Results  Component Value Date   WBC 4.1 08/01/2016   NEUTROABS 2.6 08/01/2016   HGB 10.0 (L) 08/01/2016   HCT 30.1 (L) 08/01/2016   MCV 90.2 08/01/2016  PLT 239 08/01/2016      Chemistry      Component Value Date/Time   NA 142 07/20/2015 0944   K 4.5 07/20/2015 0944   CL 103 05/16/2015 1018   CO2 28 07/20/2015 0944   BUN 15.2 07/20/2015 0944   CREATININE 0.9 07/20/2015 0944      Component Value Date/Time   CALCIUM 9.4 07/20/2015 0944   ALKPHOS 49 07/20/2015 0944   AST 18 07/20/2015 0944   ALT 14 07/20/2015 0944   BILITOT 0.70 07/20/2015 0944       No results found for: LABCA2  No components found for: LABCA125  No results for input(s): INR in the last 168 hours.  Urinalysis No results found for: COLORURINE  STUDIES: Mammography at [facility] February 2018 unremarkable  ASSESSMENT: 71 y.o. [place] woman status post left breast biopsy 08/02/2013 for a clinical T2 N0, stage IIA invasive papillary breast cancer, grade 1, estrogen receptor and progesterone receptor both 100% positive, with an MIB-1 of 9% and no HER-2 amplification  (1) Status post left lumpectomy and sentinel lymph node sampling 09/06/2013 for a pT1c pN0, stage IA invasive ductal carcinoma, grade 1, repeat HER-2 again negative  (2) Adjuvant radiation completed 11/30/2013  (3) started tamoxifen 12/18/2013  (4) deer tick bite February 2017--no sequela  PLAN: Kiela is now 3 years out from definitive surgery for her breast cancer with no evidence of disease recurrence. This is very favorable.  The plan is to continue  tamoxifen for a total of 5 years. That means she will be able to "graduate" in 2 years.  I encouraged her to continue her excellent exercise program.   She knows to call for any other problems develop before her next visit here  Chauncey Cruel, MD   08/01/2016 10:57 AM

## 2016-08-09 ENCOUNTER — Encounter: Payer: Self-pay | Admitting: Family Medicine

## 2016-08-09 ENCOUNTER — Ambulatory Visit (INDEPENDENT_AMBULATORY_CARE_PROVIDER_SITE_OTHER): Payer: Medicare HMO | Admitting: Family Medicine

## 2016-08-09 DIAGNOSIS — H6981 Other specified disorders of Eustachian tube, right ear: Secondary | ICD-10-CM | POA: Diagnosis not present

## 2016-08-09 NOTE — Progress Notes (Signed)
Prev with R ear sx in 05/2016.  It got some better, then came back.  Sometimes she would bend over and then the sx would come back.  It didn't happen every time with leaning over.  Then she had more trouble a few days ago, used hot compresses on the R side of her head and felt better.  This week was the worst for sx but better in the last 2 days.    No FCNAVD.  No L sided sx.  Can have frontal and maxillary pressure on the R side.   Hearing is fine per patient report.  She had used flonase for about 1 month, then stopped the med.  Restarted in the last 2 weeks.    Meds, vitals, and allergies reviewed.   ROS: Per HPI unless specifically indicated in ROS section   GEN: nad, alert and oriented HEENT: mucous membranes moist, tm w/o erythema, nasal exam w/o erythema, some clear discharge noted,  OP wnl NECK: supple w/o LA CV: rrr.   PULM: ctab, no inc wob No movement of right tympanic membrane with Valsalva

## 2016-08-09 NOTE — Patient Instructions (Signed)
Keep using flonase and update me if not better. We can refer you to ENT if needed.  Take care.  Glad to see you.

## 2016-08-11 NOTE — Assessment & Plan Note (Signed)
Discussed with patient. Continue Flonase for now. Continue Valsalva. If this does not fully resolve, we can refer to ENT. Call back as needed. She agrees.

## 2016-09-28 ENCOUNTER — Other Ambulatory Visit: Payer: Self-pay | Admitting: Family Medicine

## 2016-10-04 ENCOUNTER — Telehealth: Payer: Self-pay

## 2016-10-04 NOTE — Telephone Encounter (Signed)
Patient is on the list for [employer] 2018 and may be a good candidate for an AWV. Please let me know if/when appt is scheduled.   

## 2016-10-05 NOTE — Telephone Encounter (Signed)
Left pt message asking to call Allison back directly at [phone] to schedule AWV.+ labs with Lesia and CPE with PCP. °

## 2016-10-25 NOTE — Telephone Encounter (Signed)
Scheduled 12/04/16

## 2016-11-13 DIAGNOSIS — H5210 Myopia, unspecified eye: Secondary | ICD-10-CM | POA: Diagnosis not present

## 2016-11-13 DIAGNOSIS — H524 Presbyopia: Secondary | ICD-10-CM | POA: Diagnosis not present

## 2016-11-13 DIAGNOSIS — Z0100 Encounter for examination of eyes and vision without abnormal findings: Secondary | ICD-10-CM | POA: Diagnosis not present

## 2016-11-22 NOTE — Progress Notes (Addendum)
PCP notes:   Health maintenance: Hepatitis C screening due-patient declines today.  MMG-last Feb. 2018 per patient. Records requested from [facility].   Abnormal screenings:  Hearing-failed.   Patient concerns: She continues to note R ear fullness and R sided sinus pain.   Nurse concerns: BP elevated, patient reports white coat HTN.    Next PCP appt: 12/13/2016 @9 :45am  I reviewed health advisor's note, was available for consultation on the day of service listed in this note, and agree with documentation and plan. Elsie Stain, MD.

## 2016-11-22 NOTE — Progress Notes (Signed)
Subjective:   Kristi Carr is a 71 y.o. female who presents for Medicare Annual (Subsequent) preventive examination.  Review of Systems:  No ROS.  Medicare Wellness Visit. Additional risk factors are reflected in the social history.  Cardiac Risk Factors include: advanced age (>72men, >89 women);dyslipidemia;hypertension     Objective:     Vitals: BP (!) 172/86   Pulse 67   Resp 16   Ht 5' 4.5" (1.638 m)   Wt 140 lb 4 oz (63.6 kg)   SpO2 96%   BMI 23.70 kg/m   Body mass index is 23.7 kg/m.  BP Readings from Last 3 Encounters:  12/04/16 (!) 172/86  08/09/16 (!) 144/86  08/01/16 (!) 165/86     Tobacco History  Smoking Status  . Never Smoker  Smokeless Tobacco  . Never Used     Counseling given: Not Answered   Past Medical History:  Diagnosis Date  . Anemia   . Breast cancer of upper-outer quadrant of left female breast ([place])   . Gallstones 08/23/03   x 2  . Hot flashes   . Hypertension   . Kidney stones   . Radiation 10/14/13-12/02/13   left breast 50.4 gray, lumpectomy cavity boosted to 63 gray  . Wears glasses    Past Surgical History:  Procedure Laterality Date  . BREAST LUMPECTOMY WITH SENTINEL LYMPH NODE BIOPSY  09/06/13   LEFT BREAST SEED GUIDED LUMPECTOMY WITH LEFT AXILLARY SENTINEL NODE BIOPSY (Left)  . CHOLECYSTECTOMY    . COLONOSCOPY    . PARTIAL HYSTERECTOMY  1980   ovaries left in  . TONSILLECTOMY    . TUBAL LIGATION    . WISDOM TOOTH EXTRACTION     Family History  Problem Relation Age of Onset  . Diabetes Mother   . Hyperlipidemia Mother   . Hypertension Mother   . Cancer Mother        Breast   . Breast cancer Mother   . Heart attack Father   . Heart disease Father   . Hypertension Sister   . Heart disease Brother        CHF  EF 10%  . Hypertension Brother   . Gout Brother   . Depression Neg Hx   . Alcohol abuse Neg Hx   . Drug abuse Neg Hx   . Colon cancer Neg Hx   . Uterine cancer Neg Hx   . Ovarian cancer Neg Hx   .  Prostate cancer Neg Hx    History  Sexual Activity  . Sexual activity: Yes    Outpatient Encounter Prescriptions as of 12/04/2016  Medication Sig  . Biotin 1 MG CAPS Take 1 tablet by mouth daily.  . Calcium Carbonate-Vitamin D (CALTRATE 600+D PO) Take 1 tablet by mouth every other day.  . cholestyramine (QUESTRAN) 4 g packet TAKE 1/2 PACKET 2 TIMES A DAY AS NEEDED(LOOSE STOOL)  . clobetasol cream (TEMOVATE) 0.05 %   . co-enzyme Q-10 30 MG capsule Take 100 mg by mouth daily.  . ergocalciferol (VITAMIN D2) 50000 UNITS capsule Take 50,000 Units by mouth every 30 (thirty) days.  . fluticasone (FLONASE) 50 MCG/ACT nasal spray Place 2 sprays into both nostrils daily. (Patient taking differently: Place 2 sprays into both nostrils daily as needed. )  . ibuprofen (GNP IBUPROFEN) 200 MG tablet Take 200 mg by mouth as needed.    . metoprolol succinate (TOPROL-XL) 25 MG 24 hr tablet TAKE 1 TABLET BY MOUTH TWICE A DAY  . multivitamin (  THERAGRAN) per tablet Take 1 tablet by mouth daily.    . Omega-3 Fatty Acids (FISH OIL) 1000 MG CPDR 1 by mouth daily  . tamoxifen (NOLVADEX) 20 MG tablet Take 1 tablet (20 mg total) by mouth daily.   No facility-administered encounter medications on file as of 12/04/2016.     Activities of Daily Living In your present state of health, do you have any difficulty performing the following activities: 12/04/2016  Hearing? N  Vision? N  Difficulty concentrating or making decisions? N  Walking or climbing stairs? N  Dressing or bathing? N  Doing errands, shopping? N  Preparing Food and eating ? N  Using the Toilet? N  In the past six months, have you accidently leaked urine? Y  Do you have problems with loss of bowel control? N  Managing your Medications? N  Managing your Finances? N  Housekeeping or managing your Housekeeping? N  Some recent data might be hidden    Patient Care Team: [name], MD as PCP - General (Family Medicine) Paula Compton, MD  as Consulting Physician (Obstetrics and Gynecology) Webb Laws, OD (Optometry)    Assessment:    Physical assessment deferred to PCP.  Exercise Activities and Dietary recommendations Current Exercise Habits: Home exercise routine, Type of exercise: treadmill;Other - see comments;walking (gardening), Time (Minutes): > 60, Frequency (Times/Week): 3, Weekly Exercise (Minutes/Week): 0  Goals    . Increase physical activity (pt-stated)          Starting fall 2018, I will sign up for Silver Sneakers and start going at least 2 times weekly for 30 minutes each time.       Fall Risk Fall Risk  12/04/2016 05/23/2015 11/02/2012  Falls in the past year? No No No   Depression Screen PHQ 2/9 Scores 12/04/2016 05/23/2015 11/02/2012  PHQ - 2 Score 0 0 0     Cognitive Function PLEASE NOTE: A Mini-Cog screen was completed. Maximum score is 20. A value of 0 denotes this part of [place] MMSE was not completed or the patient failed this part of the Mini-Cog screening.   Mini-Cog Screening Orientation to Time - Max 5 pts Orientation to Place - Max 5 pts Registration - Max 3 pts Recall - Max 3 pts Language Repeat - Max 1 pts Language Follow 3 Step Command - Max 3 pts      Mini-Cog - 12/04/16 1044    Normal clock drawing test? yes   How many words correct? 2      MMSE - Mini Mental State Exam 12/04/2016  Orientation to time 5  Orientation to Place 5  Registration 3  Attention/ Calculation 0  Recall 2  Language- name 2 objects 0  Language- repeat 1  Language- follow 3 step command 3  Language- read & follow direction 0  Write a sentence 0  Copy design 0  Total score 19        Immunization History  Administered Date(s) Administered  . Influenza Whole 04/10/2013  . Influenza,inj,Quad PF,36+ Mos 05/23/2015  . Pneumococcal Conjugate-13 05/23/2015  . Pneumococcal Polysaccharide-23 11/02/2012  . Tdap 12/05/2010  . Zoster 06/22/2008   Screening Tests Health Maintenance  Topic  Date Due  . MAMMOGRAM  06/27/2016  . Hepatitis C Screening  12/04/2017 (Originally 10/19/1945)  . INFLUENZA VACCINE  12/18/2016  . COLONOSCOPY  01/28/2019  . TETANUS/TDAP  12/04/2020  . DEXA SCAN  Completed  . PNA vac Low Risk Adult  Completed      Plan:  Follow-up w/ PCP as scheduled.   I have personally reviewed and noted the following in the patient's chart:   . Medical and social history . Use of alcohol, tobacco or illicit drugs  . Current medications and supplements . Functional ability and status . Nutritional status . Physical activity . Advanced directives . List of other physicians . Vitals . Screenings to include cognitive, depression, and falls . Referrals and appointments  In addition, I have reviewed and discussed with patient certain preventive protocols, quality metrics, and best practice recommendations. A written personalized care plan for preventive services as well as general preventive health recommendations were provided to patient.     Dorrene German, RN  12/04/2016

## 2016-12-02 ENCOUNTER — Other Ambulatory Visit: Payer: Self-pay | Admitting: Family Medicine

## 2016-12-02 DIAGNOSIS — E7800 Pure hypercholesterolemia, unspecified: Secondary | ICD-10-CM

## 2016-12-02 DIAGNOSIS — E559 Vitamin D deficiency, unspecified: Secondary | ICD-10-CM

## 2016-12-04 ENCOUNTER — Other Ambulatory Visit: Payer: Self-pay | Admitting: Family Medicine

## 2016-12-04 ENCOUNTER — Other Ambulatory Visit (INDEPENDENT_AMBULATORY_CARE_PROVIDER_SITE_OTHER): Payer: Medicare HMO

## 2016-12-04 ENCOUNTER — Ambulatory Visit: Payer: Medicare HMO

## 2016-12-04 VITALS — BP 172/86 | HR 67 | Resp 16 | Ht 64.5 in | Wt 140.2 lb

## 2016-12-04 DIAGNOSIS — E7800 Pure hypercholesterolemia, unspecified: Secondary | ICD-10-CM

## 2016-12-04 DIAGNOSIS — Z0000 Encounter for general adult medical examination without abnormal findings: Secondary | ICD-10-CM

## 2016-12-04 DIAGNOSIS — E559 Vitamin D deficiency, unspecified: Secondary | ICD-10-CM

## 2016-12-04 DIAGNOSIS — Z119 Encounter for screening for infectious and parasitic diseases, unspecified: Secondary | ICD-10-CM

## 2016-12-04 LAB — LIPID PANEL
Cholesterol: 152 mg/dL (ref 0–200)
HDL: 48.3 mg/dL (ref >39.00)
LDL CALC: 68 mg/dL (ref 0–99)
NonHDL: 103.37
TRIGLYCERIDES: 175 mg/dL — AB (ref 0.0–149.0)
Total CHOL/HDL Ratio: 3
VLDL: 35 mg/dL (ref 0.0–40.0)

## 2016-12-04 LAB — VITAMIN D 25 HYDROXY (VIT D DEFICIENCY, FRACTURES): VITD: 38.77 ng/mL (ref 30.00–100.00)

## 2016-12-04 NOTE — Progress Notes (Signed)
I reviewed health advisor's note, was available for consultation, and agree with documentation and plan.  

## 2016-12-04 NOTE — Patient Instructions (Signed)
Kristi Carr , Thank you for taking time to come for your Medicare Wellness Visit. I appreciate your ongoing commitment to your health goals. Please review the following plan we discussed and let me know if I can assist you in the future.   Bring a copy of your advance directives to your next office visit.  These are the goals we discussed: Goals    . Increase physical activity (pt-stated)          Starting fall 2018, I will sign up for Silver Sneakers and start going at least 2 times weekly for 30 minutes each time.        This is a list of the screening recommended for you and due dates:  Health Maintenance  Topic Date Due  . Mammogram  06/27/2016  .  Hepatitis C: One time screening is recommended by Center for Disease Control  (CDC) for  adults born from [birth year] through 1964.   12/04/2017*  . Flu Shot  12/18/2016  . Colon Cancer Screening  01/28/2019  . Tetanus Vaccine  12/04/2020  . DEXA scan (bone density measurement)  Completed  . Pneumonia vaccines  Completed  *Topic was postponed. The date shown is not the original due date.   Preventive Care for Adults  A healthy lifestyle and preventive care can promote health and wellness. Preventive health guidelines for adults include the following key practices.  . A routine yearly physical is a good way to check with your health care provider about your health and preventive screening. It is a chance to share any concerns and updates on your health and to receive a thorough exam.  . Visit your dentist for a routine exam and preventive care every 6 months. Brush your teeth twice a day and floss once a day. Good oral hygiene prevents tooth decay and gum disease.  . The frequency of eye exams is based on your age, health, family medical history, use  of contact lenses, and other factors. Follow your health care provider's ecommendations for frequency of eye exams.  . Eat a healthy diet. Foods like vegetables, fruits, whole grains, low-fat  dairy products, and lean protein foods contain the nutrients you need without too many calories. Decrease your intake of foods high in solid fats, added sugars, and salt. Eat the right amount of calories for you. Get information about a proper diet from your health care provider, if necessary.  . Regular physical exercise is one of the most important things you can do for your health. Most adults should get at least 150 minutes of moderate-intensity exercise (any activity that increases your heart rate and causes you to sweat) each week. In addition, most adults need muscle-strengthening exercises on 2 or more days a week.  Silver Sneakers may be a benefit available to you. To determine eligibility, you may visit the website: www.silversneakers.com or contact program at [phone] Mon-Fri between 8AM-8PM.   . Maintain a healthy weight. The body mass index (BMI) is a screening tool to identify possible weight problems. It provides an estimate of body fat based on height and weight. Your health care provider can find your BMI and can help you achieve or maintain a healthy weight.   For adults 20 years and older: ? A BMI below 18.5 is considered underweight. ? A BMI of 18.5 to 24.9 is normal. ? A BMI of 25 to 29.9 is considered overweight. ? A BMI of 30 and above is considered obese.   . Maintain normal  blood lipids and cholesterol levels by exercising and minimizing your intake of saturated fat. Eat a balanced diet with plenty of fruit and vegetables. Blood tests for lipids and cholesterol should begin at age 17 and be repeated every 5 years. If your lipid or cholesterol levels are high, you are over 50, or you are at high risk for heart disease, you may need your cholesterol levels checked more frequently. Ongoing high lipid and cholesterol levels should be treated with medicines if diet and exercise are not working.  . If you smoke, find out from your health care provider how to quit. If you do  not use tobacco, please do not start.  . If you choose to drink alcohol, please do not consume more than 2 drinks per day. One drink is considered to be 12 ounces (355 mL) of beer, 5 ounces (148 mL) of wine, or 1.5 ounces (44 mL) of liquor.  . If you are 42-53 years old, ask your health care provider if you should take aspirin to prevent strokes.  . Use sunscreen. Apply sunscreen liberally and repeatedly throughout the day. You should seek shade when your shadow is shorter than you. Protect yourself by wearing long sleeves, pants, a wide-brimmed hat, and sunglasses year round, whenever you are outdoors.  . Once a month, do a whole body skin exam, using a mirror to look at the skin on your back. Tell your health care provider of new moles, moles that have irregular borders, moles that are larger than a pencil eraser, or moles that have changed in shape or color.

## 2016-12-05 LAB — HEPATITIS C ANTIBODY: HCV AB: NEGATIVE

## 2016-12-12 ENCOUNTER — Encounter: Payer: Self-pay | Admitting: Family Medicine

## 2016-12-13 ENCOUNTER — Encounter: Payer: Self-pay | Admitting: Family Medicine

## 2016-12-13 ENCOUNTER — Ambulatory Visit (INDEPENDENT_AMBULATORY_CARE_PROVIDER_SITE_OTHER): Payer: Medicare HMO | Admitting: Family Medicine

## 2016-12-13 VITALS — BP 142/80 | HR 67 | Temp 98.6°F | Ht 64.5 in | Wt 141.5 lb

## 2016-12-13 DIAGNOSIS — I10 Essential (primary) hypertension: Secondary | ICD-10-CM

## 2016-12-13 DIAGNOSIS — Z7189 Other specified counseling: Secondary | ICD-10-CM

## 2016-12-13 DIAGNOSIS — Z170 Estrogen receptor positive status [ER+]: Secondary | ICD-10-CM

## 2016-12-13 DIAGNOSIS — C50412 Malignant neoplasm of upper-outer quadrant of left female breast: Secondary | ICD-10-CM

## 2016-12-13 DIAGNOSIS — H6981 Other specified disorders of Eustachian tube, right ear: Secondary | ICD-10-CM | POA: Diagnosis not present

## 2016-12-13 DIAGNOSIS — Z0000 Encounter for general adult medical examination without abnormal findings: Secondary | ICD-10-CM

## 2016-12-13 MED ORDER — METOPROLOL SUCCINATE ER 25 MG PO TB24: 25.0000 mg | ORAL_TABLET | Freq: Two times a day (BID) | ORAL | 3 refills | Status: DC

## 2016-12-13 NOTE — Patient Instructions (Addendum)
Check with your insurance to see if they will cover the shingrix shot. I would get a flu shot each fall.   Ask the gynecology clinic about your bone density test.   Take care.  Glad to see you.

## 2016-12-13 NOTE — Progress Notes (Signed)
Hepatitis C screening neg, d/w pt.  Flu encouraged  Shingles prev done, d/w pt.  See AVS.   PNA up to date Tetanus 2012 Colonoscopy 2015 Breast cancer screening up to date, per onc and gyn clinic Advance directive- husband designated if patient were incapacitated.  Abnormal screenings:  Hearing-failed. D/w pt.  Declined hearing aids.   DXA done per outside clinic.    She continues to note R ear fullness and R sided sinus pain- but this is a lot better overall.  She is temporarily worse after some episodes of sneezing.  She is able to pop her ears with elevation change now, when travelling to the mountains in [place].   Hypertension:    Using medication without problems or lightheadedness: yes Chest pain with exertion:no Edema:no Short of breath:no Average home BPs: see below.    BP elevated prev, better today, patient reports white coat HTN.  BP controlled at home with 120-140s/60-70s usually.    Still on tamoxifen with hx breast cancer noted.    Hx of decades of anemia w/o known blood loss and colonoscopy up to date.    Meds, vitals, and allergies reviewed.   PMH and SH reviewed  ROS: Per HPI unless specifically indicated in ROS section   GEN: nad, alert and oriented HEENT: mucous membranes moist NECK: supple w/o LA CV: rrr. PULM: ctab, no inc wob ABD: soft, +bs EXT: no edema SKIN: no acute rash

## 2016-12-15 DIAGNOSIS — Z0000 Encounter for general adult medical examination without abnormal findings: Secondary | ICD-10-CM | POA: Insufficient documentation

## 2016-12-15 NOTE — Assessment & Plan Note (Signed)
Hepatitis C screening neg, d/w pt.  Flu encouraged  Shingles prev done, d/w pt.  See AVS.   PNA up to date Tetanus 2012 Colonoscopy 2015 Breast cancer screening up to date, per onc and gyn clinic Advance directive- husband designated if patient were incapacitated.  Abnormal screenings:  Hearing-failed. D/w pt.  Declined hearing aids.   DXA done per outside clinic.

## 2016-12-15 NOTE — Assessment & Plan Note (Addendum)
She likely has a whitecoat component. Blood pressure at home has been controlled. See above. No change in medications. Continue work on diet and exercise, d/w pt. . She agrees. Labs discussed with patient. >25 minutes spent in face to face time with patient, >50% spent in counselling or coordination of care.

## 2016-12-15 NOTE — Assessment & Plan Note (Signed)
She is better in the meantime, continue use of Flonase and Valsalva as needed. Anatomy discussed with patient.

## 2016-12-15 NOTE — Assessment & Plan Note (Signed)
Per oncology clinic. Appreciate help of all involved.

## 2016-12-15 NOTE — Assessment & Plan Note (Signed)
Advance directive- husband designated if patient were incapacitated.  

## 2017-01-07 DIAGNOSIS — Z853 Personal history of malignant neoplasm of breast: Secondary | ICD-10-CM | POA: Diagnosis not present

## 2017-01-07 DIAGNOSIS — R922 Inconclusive mammogram: Secondary | ICD-10-CM | POA: Diagnosis not present

## 2017-02-13 DIAGNOSIS — Z01419 Encounter for gynecological examination (general) (routine) without abnormal findings: Secondary | ICD-10-CM | POA: Diagnosis not present

## 2017-02-13 DIAGNOSIS — Z960 Presence of urogenital implants: Secondary | ICD-10-CM | POA: Diagnosis not present

## 2017-02-13 DIAGNOSIS — Z1389 Encounter for screening for other disorder: Secondary | ICD-10-CM | POA: Diagnosis not present

## 2017-03-06 ENCOUNTER — Telehealth: Payer: Self-pay | Admitting: Family Medicine

## 2017-03-06 NOTE — Telephone Encounter (Addendum)
I called pt and spoke with a gentleman who looked or her but could not find her and he will have pt call [place]. Pt has called back; pt has drank 2 cups of water, ate noodles and drank pepto bismol since talking with TH. No abdominal pain now and has not had diarrhea since spoke with TH. Pt is going to wait and see how she does and if pt sees blood in BM or extreme abd pain pt will go to ED. If diarrhea starts back pt will call for appt at [facility]. FYI to Dr Damita Dunnings.

## 2017-03-06 NOTE — Telephone Encounter (Signed)
Noted. Thanks.

## 2017-03-06 NOTE — Telephone Encounter (Signed)
[facility]  Patient Name: Kristi Carr  DOB: 05/13/1946    Initial Comment Caller states she had dental surgery 4 weeks ago and she took it for a week and a side effect of the medication is diarrhea. She stated she has had diarrhea all week and she is unsure how to get it to stop. Clindamycin.    Nurse Assessment  Nurse: Sherrell Puller, RN, Amy Date/Time [name] Time): 03/06/2017 9:56:06 AM  Confirm and document reason for call. If symptomatic, describe symptoms. ---Caller states she took Clindamycin after dental surgery and now she is still having diarrhea. Diarrhea for 5 days now, having 4-5 diarrhea stools everyday. No fever, no other symptoms.  Does the patient have any new or worsening symptoms? ---Yes  Will a triage be completed? ---Yes  Related visit to physician within the last 2 weeks? ---No  Does the PT have any chronic conditions? (i.e. diabetes, asthma, etc.) ---Yes  List chronic conditions. ---HTN  Is this a behavioral health or substance abuse call? ---No     Guidelines    Guideline Title Affirmed Question Affirmed Notes  Diarrhea [1] MODERATE diarrhea (e.g., 4-6 times / day more than normal) AND [2] present > 48 hours (2 days)    Final Disposition User   See Physician within 24 Hours Ramsey, RN, Amy    Referrals  GO TO FACILITY REFUSED   Caller Disagree/Comply Disagree  Caller Understands Yes  PreDisposition Home Care

## 2017-03-10 ENCOUNTER — Encounter: Payer: Self-pay | Admitting: Family Medicine

## 2017-03-10 ENCOUNTER — Ambulatory Visit (INDEPENDENT_AMBULATORY_CARE_PROVIDER_SITE_OTHER): Payer: Medicare HMO | Admitting: Family Medicine

## 2017-03-10 VITALS — BP 142/78 | HR 77 | Temp 97.8°F | Wt 136.5 lb

## 2017-03-10 DIAGNOSIS — Z23 Encounter for immunization: Secondary | ICD-10-CM | POA: Diagnosis not present

## 2017-03-10 DIAGNOSIS — R197 Diarrhea, unspecified: Secondary | ICD-10-CM | POA: Diagnosis not present

## 2017-03-10 DIAGNOSIS — A0472 Enterocolitis due to Clostridium difficile, not specified as recurrent: Secondary | ICD-10-CM

## 2017-03-10 MED ORDER — FLUTICASONE PROPIONATE 50 MCG/ACT NA SUSP: 2.0000 | Freq: Every day | NASAL | Status: DC | PRN

## 2017-03-10 MED ORDER — CLOBETASOL PROPIONATE 0.05 % EX CREA: TOPICAL_CREAM | CUTANEOUS | Status: DC

## 2017-03-10 NOTE — Progress Notes (Signed)
She has been using [place] only if needed, with a fattier meal, when she goes out to eat.    She had dental work done and had to take clindamycin.  She tolerated it, then started having diarrhea last week.  This is clearly different from her prev sx.  She had some abd pain initially with BMs.  That pain has improved- less pain now and not with each BM.  Today she feels better, at the time of the OV.   Her stools are not watery now but are not back to her baseline.  She typically doesn't have very solid stools.  She has tried bland foods.  No abd pain with eating.  No nausea.  No fevers.  No blood in stool.   Meds, vitals, and allergies reviewed.   ROS: Per HPI unless specifically indicated in ROS section   GEN: nad, alert and oriented HEENT: mucous membranes moist NECK: supple w/o LA CV: rrr.  PULM: ctab, no inc wob ABD: soft, +bs EXT: no edema

## 2017-03-10 NOTE — Patient Instructions (Signed)
Go to the lab on the way out.  We'll contact you with your lab report. Gradually advance your diet.  Take care.  Glad to see you.

## 2017-03-11 ENCOUNTER — Other Ambulatory Visit: Payer: Self-pay | Admitting: Family Medicine

## 2017-03-11 ENCOUNTER — Telehealth: Payer: Self-pay | Admitting: Radiology

## 2017-03-11 ENCOUNTER — Telehealth: Payer: Self-pay | Admitting: Family Medicine

## 2017-03-11 DIAGNOSIS — A0471 Enterocolitis due to Clostridium difficile, recurrent: Secondary | ICD-10-CM | POA: Insufficient documentation

## 2017-03-11 DIAGNOSIS — A0472 Enterocolitis due to Clostridium difficile, not specified as recurrent: Secondary | ICD-10-CM | POA: Insufficient documentation

## 2017-03-11 LAB — C. DIFFICILE GDH AND TOXIN A/B
GDH ANTIGEN: DETECTED
MICRO NUMBER: 81178329
SPECIMEN QUALITY: ADEQUATE
TOXIN A AND B: DETECTED

## 2017-03-11 MED ORDER — METRONIDAZOLE 500 MG PO TABS: 500.0000 mg | ORAL_TABLET | Freq: Three times a day (TID) | ORAL | 0 refills | Status: DC

## 2017-03-11 NOTE — Telephone Encounter (Signed)
[facility] called a critical result, C Diff POSITIVE, results given to Dr Damita Dunnings

## 2017-03-11 NOTE — Telephone Encounter (Signed)
Copied from [place] #[id number]. Topic: Quick Communication - See Telephone Encounter >> Mar 11, 2017  9:43 AM Arletha Grippe wrote: CRM for notification. See Telephone encounter for:  03/11/17.  pt is calling regarding lab result

## 2017-03-11 NOTE — Telephone Encounter (Signed)
Pt called again about labs

## 2017-03-11 NOTE — Assessment & Plan Note (Signed)
Concern was for antibiotic associated diarrhea, potentially including Clostridium difficile diarrhea. Differential discussed with patient. Rationale for testing discussed with patient. Nontoxic. Okay for outpatient follow-up. C. difficile testing positive. Start metronidazole. See notes on labs.

## 2017-03-12 NOTE — Telephone Encounter (Signed)
Had already seen result. Previously addressed. Thanks.

## 2017-03-12 NOTE — Telephone Encounter (Signed)
Called patient back and lab results were given to her. 

## 2017-03-13 ENCOUNTER — Ambulatory Visit: Payer: Medicare HMO | Admitting: Family Medicine

## 2017-03-13 ENCOUNTER — Ambulatory Visit: Payer: Self-pay | Admitting: *Deleted

## 2017-03-13 NOTE — Telephone Encounter (Signed)
I spoke with pt and she does not feel worse but is concerned that she is not any better; advised pt may need abx in her system longer before seeing improvement in symptoms; pt voiced understanding but will keep appt that she already has for 03/14/17 with Dr Damita Dunnings; Juluis Rainier to Dr Damita Dunnings.

## 2017-03-13 NOTE — Telephone Encounter (Signed)
This was routed to [employer] at [facility] as a high priority  To see if they can work her into Dr. Josefine Class schedule today.

## 2017-03-13 NOTE — Telephone Encounter (Signed)
Kristi Carrow RN also noted;This pt was seen on Monday with dx of C Diff. Started her antibiotic on Tuesday morning. She is continues to have diarrhea with cramping every time she eats. The stools are still loose and watery occurring several times a day. Can she possibly be worked in with Dr. Damita Dunnings? If not she is willing to see another provider. I told her to expect a call back from you.  If she didn't hear back to please call us back.  Thanks for your help. (Routing comment)

## 2017-03-13 NOTE — Telephone Encounter (Signed)
   Reason for Disposition . [1] SEVERE diarrhea (e.g., 7 or more times / day more than normal) AND [2]  age > 60 years  Answer Assessment - Initial Assessment Questions 1. DIARRHEA SEVERITY: "How bad is the diarrhea?" "How many extra stools have you had in the past 24 hours than normal?" Several loose, watery   - MILD: Few loose or mushy BMs; increase of 1-3 stools over normal daily number of stools; mild increase in ostomy output.   - MODERATE: Increase of 4-6 stools daily over normal; moderate increase in ostomy output.   - SEVERE (or Worst Possible): Increase of 7 or more stools daily over normal; moderate increase in ostomy output; incontinence.     Yes 2. ONSET: "When did the diarrhea begin?"      12 days ago.  Been seen by MD and started on antibiotic but not better. 3. BM CONSISTENCY: "How loose or watery is the diarrhea?"      Loose and watery 4. VOMITING: "Are you also vomiting?" If so, ask: "How many times in the past 24 hours?"      No 5. ABDOMINAL PAIN: "Are you having any abdominal pain?" If yes: "What does it feel like?" (e.g., crampy, dull, intermittent, constant)      Cramping 2 hrs after I eat then the diarrhea begins. 6. ABDOMINAL PAIN SEVERITY: If present, ask: "How bad is the pain?"  (e.g., Scale 1-10; mild, moderate, or severe)    - MILD (1-3): doesn't interfere with normal activities, abdomen soft and not tender to touch     - MODERATE (4-7): interferes with normal activities or awakens from sleep, tender to touch     - SEVERE (8-10): excruciating pain, doubled over, unable to do any normal activities       Moderate 7. ORAL INTAKE: If vomiting, "Have you been able to drink liquids?" "How much fluids have you had in the past 24 hours?"     Not drinking 8. HYDRATION: "Any signs of dehydration?" (e.g., dry mouth [not just dry lips], too weak to stand, dizziness, new weight loss) "When did you last urinate?"     No 9. EXPOSURE: "Have you traveled to a foreign country  recently?" "Have you been exposed to anyone with diarrhea?" "Could you have eaten any food that was spoiled?"     C diff. 10. OTHER SYMPTOMS: "Do you have any other symptoms?" (e.g., fever, blood in stool)       No fever. 11. PREGNANCY: "Is there any chance you are pregnant?" "When was your last menstrual period?"       N/A  Protocols used: DIARRHEA-A-AH

## 2017-03-14 ENCOUNTER — Ambulatory Visit: Payer: Medicare HMO | Admitting: Family Medicine

## 2017-03-25 ENCOUNTER — Ambulatory Visit: Payer: Self-pay

## 2017-03-25 MED ORDER — METRONIDAZOLE 500 MG PO TABS: 500.0000 mg | ORAL_TABLET | Freq: Three times a day (TID) | ORAL | 0 refills | Status: DC

## 2017-03-25 NOTE — Telephone Encounter (Signed)
Patient advised.

## 2017-03-25 NOTE — Telephone Encounter (Signed)
  Reason for Disposition . Abdominal pain  (Exception: Pain clears with each passage of diarrhea stool)  Answer Assessment - Initial Assessment Questions 1. DIARRHEA SEVERITY: "How bad is the diarrhea?" "How many extra stools have you had in the past 24 hours than normal?"    - MILD: Few loose or mushy BMs; increase of 1-3 stools over normal daily number of stools; mild increase in ostomy output.   - MODERATE: Increase of 4-6 stools daily over normal; moderate increase in ostomy output.   - SEVERE (or Worst Possible): Increase of 7 or more stools daily over normal; moderate increase in ostomy output; incontinence.     4-6 2. ONSET: "When did the diarrhea begin?"      Sunday night 3. BM CONSISTENCY: "How loose or watery is the diarrhea?"      Loose 4. VOMITING: "Are you also vomiting?" If so, ask: "How many times in the past 24 hours?"      No 5. ABDOMINAL PAIN: "Are you having any abdominal pain?" If yes: "What does it feel like?" (e.g., crampy, dull, intermittent, constant)      Crampy;Intermittent 6. ABDOMINAL PAIN SEVERITY: If present, ask: "How bad is the pain?"  (e.g., Scale 1-10; mild, moderate, or severe)    - MILD (1-3): doesn't interfere with normal activities, abdomen soft and not tender to touch     - MODERATE (4-7): interferes with normal activities or awakens from sleep, tender to touch     - SEVERE (8-10): excruciating pain, doubled over, unable to do any normal activities       5-6 7. ORAL INTAKE: If vomiting, "Have you been able to drink liquids?" "How much fluids have you had in the past 24 hours?"     Drinking and eating without difficulty 8. HYDRATION: "Any signs of dehydration?" (e.g., dry mouth [not just dry lips], too weak to stand, dizziness, new weight loss) "When did you last urinate?"     No dehydration 9. EXPOSURE: "Have you traveled to a foreign country recently?" "Have you been exposed to anyone with diarrhea?" "Could you have eaten any food that was spoiled?"     No 10. OTHER SYMPTOMS: "Do you have any other symptoms?" (e.g., fever, blood in stool)       No fevre 11. PREGNANCY: "Is there any chance you are pregnant?" "When was your last menstrual period?"        No  Protocols used: DIARRHEA-A-AH Pt. Reports she just finished a course of medication for C-Diff. Wants to if doctor wants her on more medication.States her diarrhea "is no where near as bad" but still difficult.

## 2017-03-25 NOTE — Telephone Encounter (Signed)
Restart flagyl and if not better in the next few days we'll have to change her med around.  Have her update me in about 2-3 days, sooner if worse/fever/blood in stool.  Thanks.  rx sent.

## 2017-03-28 ENCOUNTER — Ambulatory Visit: Payer: Self-pay | Admitting: *Deleted

## 2017-03-28 ENCOUNTER — Telehealth: Payer: Self-pay | Admitting: Family Medicine

## 2017-03-28 MED ORDER — VANCOMYCIN 50 MG/ML ORAL SOLUTION: 125.0000 mg | Freq: Four times a day (QID) | ORAL | 0 refills | Status: DC

## 2017-03-28 NOTE — Telephone Encounter (Signed)
Copied from [facility] #[id number]. Topic: Inquiry >> Mar 28, 2017  9:34 AM Neva Seat wrote: Pt is still waiting to know which medication to be taking. Pt did not take 9am medication incase it will need to be changed to the other medication.

## 2017-03-28 NOTE — Telephone Encounter (Signed)
I would sent it for them to hold, just in case.  If she gets worse off the metronidazole, I didn't want to delay the f/u vanc treatment.  Thanks.

## 2017-03-28 NOTE — Telephone Encounter (Signed)
Patient states that she is better, no pain.  Patient has medication to last through Sunday and prefers to give [place] a call on Monday with an update.  She is not having as many BM's now, 5 yesterday, 3 today but she feels really fatigued.  Shall we send the Vancomycin?

## 2017-03-28 NOTE — Telephone Encounter (Signed)
If she continues to improve and has resolution of sx with metronidazole, then no other tx needed.  If any worsening or lack of resolution of sx, then change to oral vancomycin 125 mg 4 times daily for 10 days.  Please call that rx to hold at the pharmacy in the meantime.   Let me know if she has questions or if not getting better.   Thanks.

## 2017-03-28 NOTE — Telephone Encounter (Signed)
Patient advised. Medication phoned to pharmacy.  

## 2017-03-28 NOTE — Telephone Encounter (Signed)
Pt states her pain is not as bad and she did not have to get up to go the bathroom during the night. She states she is "some better". She says she has to go to the bathroom 30 minutes to 2 hours after every meal, but has not went back to the bathroom since 0700.  Advised pt to continue antibiotic

## 2017-03-28 NOTE — Telephone Encounter (Signed)
  Still have very loose frequent diarrha even with the second round of Flaygl.   Dr. Damita Dunnings told me last time I was in if this didn't work he would call me in another antibiotic.    I don't feel I need another appt since he told me that.    A high priority note was sent for them to contact pt. Reason for Disposition . [1] Recent antibiotic therapy (i.e., within last 2 months) AND [2] > 3 days since antibiotic was stopped  Answer Assessment - Initial Assessment Questions 1. DIARRHEA SEVERITY: "How bad is the diarrhea?" "How many extra stools have you had in the past 24 hours than normal?"    - MILD: Few loose or mushy BMs; increase of 1-3 stools over normal daily number of stools; mild increase in ostomy output.   - MODERATE: Increase of 4-6 stools daily over normal; moderate increase in ostomy output.   - SEVERE (or Worst Possible): Increase of 7 or more stools daily over normal; moderate increase in ostomy output; incontinence.     Severe   Several times a day.  Still very loose, mushy 2. ONSET: "When did the diarrhea begin?"      October  Dx with C diff 3. BM CONSISTENCY: "How loose or watery is the diarrhea?"      Loose, mushy 4. VOMITING: "Are you also vomiting?" If so, ask: "How many times in the past 24 hours?"      No vomiting    Feeling barely nauseas.   No fever. 5. ABDOMINAL PAIN: "Are you having any abdominal pain?" If yes: "What does it feel like?" (e.g., crampy, dull, intermittent, constant)      No pain 6. ABDOMINAL PAIN SEVERITY: If present, ask: "How bad is the pain?"  (e.g., Scale 1-10; mild, moderate, or severe)    - MILD (1-3): doesn't interfere with normal activities, abdomen soft and not tender to touch     - MODERATE (4-7): interferes with normal activities or awakens from sleep, tender to touch     - SEVERE (8-10): excruciating pain, doubled over, unable to do any normal activities       No pain 7. ORAL INTAKE: If vomiting, "Have you been able to drink liquids?" "How  much fluids have you had in the past 24 hours?"     Drinking fluids ok 8. HYDRATION: "Any signs of dehydration?" (e.g., dry mouth [not just dry lips], too weak to stand, dizziness, new weight loss) "When did you last urinate?"     No.   Urinating fine  9. EXPOSURE: "Have you traveled to a foreign country recently?" "Have you been exposed to anyone with diarrhea?" "Could you have eaten any food that was spoiled?"     No    Had surgery on gums took antibiotics for a week then I got the C diff. 10. OTHER SYMPTOMS: "Do you have any other symptoms?" (e.g., fever, blood in stool)       No 11. PREGNANCY: "Is there any chance you are pregnant?" "When was your last menstrual period?"       N/A  Protocols used: DIARRHEA-A-AH

## 2017-03-31 ENCOUNTER — Other Ambulatory Visit: Payer: Self-pay | Admitting: *Deleted

## 2017-03-31 MED ORDER — VANCOMYCIN HCL 125 MG PO CAPS: 125.0000 mg | ORAL_CAPSULE | Freq: Four times a day (QID) | ORAL | 0 refills | Status: DC

## 2017-03-31 NOTE — Telephone Encounter (Signed)
Would change to the vancomycin tabs.  rx sent.  Please have her update [place] as the end of the week.  Thanks.

## 2017-03-31 NOTE — Telephone Encounter (Signed)
See note below. The pharmacy has received the Rx (phoned on in Friday afternoon, late) but does not have the oral solution, only tablets.  Please clarify.

## 2017-03-31 NOTE — Telephone Encounter (Signed)
Pharmacy has Vancomycin 125 mg tablets. Patient says she is down to 3 bathroom trips per day but BM is still runny and loose, no pain at all, still feels a little nauseous but no vomiting.  Patient has been able to eat some and keep it down but still feeling fatigued.  Patient finishes her last 2 pills today of the current medication.  Please advise.

## 2017-03-31 NOTE — Addendum Note (Signed)
Addended by: [name] on: 03/31/2017 02:10 PM   Modules accepted: Orders

## 2017-03-31 NOTE — Telephone Encounter (Signed)
Patient notified as instructed by telephone and verbalized understanding. 

## 2017-03-31 NOTE — Telephone Encounter (Signed)
Patient called this morning and stated that the pharmacy has not received the vancomycin. Please call pt

## 2017-03-31 NOTE — Telephone Encounter (Signed)
[facility] ,"Melanie", called to report they do not have liquid Vancomycin in the ordered strength. They do have the 50 mg capsule - if M.D. Will change this order.

## 2017-03-31 NOTE — Telephone Encounter (Signed)
What strength tabs does the pharmacy have?  The issue is the dose/frequency, not formulation.  If she can get the same dose via pills, then disp QS to fill.  If they don't have the same dose, then let me know.  And how is the patient?  Let me know.  Thanks.

## 2017-04-09 ENCOUNTER — Telehealth: Payer: Self-pay | Admitting: Family Medicine

## 2017-04-09 MED ORDER — CYCLOBENZAPRINE HCL 5 MG PO TABS: 5.0000 mg | ORAL_TABLET | Freq: Three times a day (TID) | ORAL | 0 refills | Status: DC | PRN

## 2017-04-09 NOTE — Telephone Encounter (Signed)
Patient notified as instructed by telephone and verbalized understanding. Patient stated that she is doing fine now with the diarrhea and only going to the bathroom once a day. Patient stated that she has one more pill to take and if problems return she will call back and let Dr. Damita Dunnings know.

## 2017-04-09 NOTE — Telephone Encounter (Signed)
Take OTC ibuprofen with food.  If not better with that and with gently stretching, then use flexeril.  rx sent.   If she has any more diarrhea at the end of the vanc course, then let us know so she can restart the rx for another round.  Thanks.

## 2017-04-09 NOTE — Telephone Encounter (Signed)
Copied from [place] [id number]. Topic: Quick Communication - See Telephone Encounter >> Apr 09, 2017  8:21 AM Ether Griffins B wrote: CRM for notification. See Telephone encounter for:  Dr. Damita Dunnings has been treating pt for C-Dif, she is feeling better from that but where she has been laying around her sciatic nerve is now bothering her she has been icing and taking tylenol for it every 8 hours its not helping , she would like to know if he could call her something in.  04/09/17.

## 2017-06-16 DIAGNOSIS — I10 Essential (primary) hypertension: Secondary | ICD-10-CM | POA: Diagnosis not present

## 2017-06-16 DIAGNOSIS — Z79810 Long term (current) use of selective estrogen receptor modulators (SERMs): Secondary | ICD-10-CM | POA: Diagnosis not present

## 2017-06-16 DIAGNOSIS — C50919 Malignant neoplasm of unspecified site of unspecified female breast: Secondary | ICD-10-CM | POA: Diagnosis not present

## 2017-06-16 DIAGNOSIS — Z8249 Family history of ischemic heart disease and other diseases of the circulatory system: Secondary | ICD-10-CM | POA: Diagnosis not present

## 2017-07-02 ENCOUNTER — Ambulatory Visit: Payer: Self-pay

## 2017-07-02 NOTE — Telephone Encounter (Signed)
Patient notified as instructed by telephone and verbalized understanding. Appointment scheduled for 07/04/17.

## 2017-07-02 NOTE — Telephone Encounter (Signed)
Last seen 03/10/17

## 2017-07-02 NOTE — Telephone Encounter (Signed)
Patient called in to say "every since Sunday, I eat and 1 hour later my stomach hurts, I go to the bathroom, have a loose stool and the pain goes away. This happens about 3-4 times a day. The symptoms are not bad like it was when I had c-diff, but I wanted to make sure that everything is fine and if this is something that I will just have to deal with." I asked about drinking liquids, she said "liquids are fine, it's just food." I advised I would send her questions to Dr. Damita Dunnings and someone will be in touch with answers, she verbalized understanding.

## 2017-07-02 NOTE — Telephone Encounter (Signed)
Please get OV set up.  We can talk about f/u testing and options and get all of this started at the [facility].  Thanks.

## 2017-07-04 ENCOUNTER — Ambulatory Visit (INDEPENDENT_AMBULATORY_CARE_PROVIDER_SITE_OTHER): Payer: Medicare HMO | Admitting: Family Medicine

## 2017-07-04 ENCOUNTER — Encounter: Payer: Self-pay | Admitting: Family Medicine

## 2017-07-04 VITALS — BP 146/82 | HR 84 | Temp 98.0°F | Wt 131.5 lb

## 2017-07-04 DIAGNOSIS — R197 Diarrhea, unspecified: Secondary | ICD-10-CM

## 2017-07-04 DIAGNOSIS — R011 Cardiac murmur, unspecified: Secondary | ICD-10-CM

## 2017-07-04 DIAGNOSIS — A0472 Enterocolitis due to Clostridium difficile, not specified as recurrent: Secondary | ICD-10-CM

## 2017-07-04 NOTE — Patient Instructions (Addendum)
If you have more diarrhea, then get a sample collected.   Reasonable to try the cholestyramine once a day for a week, then twice a day for a week if needed.  Update me as needed.   Kristi Carr will call about your referral for the echo.  Take care.  Glad to see you.  Schedule a yearly visit for the summer, labs ahead of time.

## 2017-07-04 NOTE — Progress Notes (Signed)
F/u re: diarrhea.  She had resolved from prev C diff but had return of diarrhea recently.  No fevers.  She feels better today.  No blood in stools.  No mucous in stools now.  No abd pain now.      She is taking cholestyramine PRN, not daily.  She has looser stools at baseline to h/o cholecystectomy.    Her husband was recently sick with GI upset.  Unclear if she recent had similar issue.    Recently had murmur noted by outside RN with insurance.  D/w pt.  No previous dx.  No CP, SOB, BLE edema.   PMH and SH reviewed  ROS: Per HPI unless specifically indicated in ROS section   Meds, vitals, and allergies reviewed.   GEN: nad, alert and oriented HEENT: mucous membranes moist NECK: supple w/o LA CV: rrr, murmur noted, SEM and R upper sternal border.  I do not hear a definite radiation to the carotids. PULM: ctab, no inc wob ABD: soft, +bs EXT: no edema  R TMJ slightly sore, d/w pt about symptomatic tx and trigger avoidance.

## 2017-07-05 LAB — C. DIFFICILE GDH AND TOXIN A/B
GDH ANTIGEN: DETECTED
MICRO NUMBER:: 90206452
SPECIMEN QUALITY: ADEQUATE
TOXIN A AND B: NOT DETECTED

## 2017-07-05 LAB — CLOSTRIDIUM DIFFICILE TOXIN B, QUALITATIVE, REAL-TIME PCR: CDIFFPCR: DETECTED — AB

## 2017-07-06 ENCOUNTER — Other Ambulatory Visit: Payer: Self-pay | Admitting: Family Medicine

## 2017-07-06 DIAGNOSIS — R011 Cardiac murmur, unspecified: Secondary | ICD-10-CM | POA: Insufficient documentation

## 2017-07-06 MED ORDER — VANCOMYCIN HCL 125 MG PO CAPS: ORAL_CAPSULE | ORAL | 0 refills | Status: DC

## 2017-07-06 NOTE — Assessment & Plan Note (Signed)
New murmur, new diagnosis.  Needs workup.  Not an ominous finding.  Pathophysiology discussed with patient.  Send for echo.  Rationale discussed.  She agrees.  Order placed.

## 2017-07-06 NOTE — Assessment & Plan Note (Signed)
It was unclear to me at the office visit if she had return of Clostridium difficile symptoms.  She did have some diarrhea recently.  We talked about using cholestyramine to see if it would make a difference in her chronic symptoms.  That is still reasonable to do.  In the meantime her Clostridium difficile testing came back positive.  See notes on labs.  Reasonable to start vancomycin taper with pulsed dose after completing the taper.  She was clearly nontoxic at the exam and okay for outpatient follow-up. >25 minutes spent in face to face time with patient, >50% spent in counselling or coordination of care.

## 2017-07-07 ENCOUNTER — Telehealth: Payer: Self-pay | Admitting: Family Medicine

## 2017-07-07 NOTE — Telephone Encounter (Signed)
Received phone result pt is positive for C-Difficile. Skyped Rena to tell result

## 2017-07-11 DIAGNOSIS — R922 Inconclusive mammogram: Secondary | ICD-10-CM | POA: Diagnosis not present

## 2017-07-11 DIAGNOSIS — Z853 Personal history of malignant neoplasm of breast: Secondary | ICD-10-CM | POA: Diagnosis not present

## 2017-07-11 DIAGNOSIS — R921 Mammographic calcification found on diagnostic imaging of breast: Secondary | ICD-10-CM | POA: Diagnosis not present

## 2017-07-23 ENCOUNTER — Other Ambulatory Visit: Payer: Self-pay

## 2017-07-23 ENCOUNTER — Ambulatory Visit (INDEPENDENT_AMBULATORY_CARE_PROVIDER_SITE_OTHER): Payer: Medicare HMO

## 2017-07-23 DIAGNOSIS — R011 Cardiac murmur, unspecified: Secondary | ICD-10-CM

## 2017-07-29 NOTE — Progress Notes (Signed)
[place]  [phone] [phone]     ID: Kristi Carr OB: 02-06-1946  MR#: [id number]  IDP#:[id number]  PCP: [name], MD GYN: Paula Compton  SU: Rolm Bookbinder OTHER MD: Gery Pray  CHIEF COMPLAINT: Estrogen receptor positive breast cancer  CURRENT THERAPY:  tamoxifen  BREAST CANCER HISTORY:  from the original intake note:  Kristi Carr had routine screening mammography at [facility] 07/16/2013. This suggested a mass at the 1:00 position in the left breast. Ultrasound of the left breast and axilla 07/23/2013 showed a 1.7 cm lobulated mass at the 2:00 position, which was hypoechoic. The left axilla was unremarkable.  Biopsy of the mass in question 08/02/2013 showed ([id number]) and invasive ductal (papillary) carcinoma, grade 1, estrogen receptor and progesterone receptor both 100% positive with strong staining intensity, with an MIB-1 of 9% and no HER-2 amplification, the signals ratio being 1.08 and date number per cell 1.95.  Her subsequent history is as detailed below  INTERVAL HISTORY: Kristi Carr returns today for follow-up of her estrogen receptor positive breast cancer. She continues on tamoxifen, with good tolerance. She denies having hot flashes or increase in vaginal discharge. She does have some vaginal itching.   Since her last visit, she underwent diagnostic bilateral mammography with CAD and tomography on 07/11/2017 at [facility] showing: breast density category C. The grouped linear calcifications in the left axillary tail are benign. There was no evidence of malignancy.   She completed a bone density at [facility] on 07/02/2016 with a T score of -2.5 osteoporosis.   REVIEW OF SYSTEMS: Kristi Carr reports that she had dental work completed and took prophylactic antibiotics.  She acquired C. difficile bacterial infection. She is taking oral vancomycin every day. She is no longer having diarrhea. She had 2 BM yesterday that were formed.  That is her  usual pattern.  Her Hg levels are significantly low, but she denies having any overt bleeding.. She denies unusual headaches, visual changes, nausea, vomiting, or dizziness. There has been no unusual cough, phlegm production, or pleurisy. This been no change in bowel or bladder habits. She denies unexplained fatigue or unexplained weight loss, bleeding, rash, or fever. A detailed review of systems was otherwise stable.    PAST MEDICAL HISTORY: Past Medical History:  Diagnosis Date  . Anemia   . Breast cancer of upper-outer quadrant of left female breast ([place])   . Gallstones 08/23/03   x 2  . Hot flashes   . Hypertension   . Kidney stones   . Radiation 10/14/13-12/02/13   left breast 50.4 gray, lumpectomy cavity boosted to 63 gray  . Wears glasses     PAST SURGICAL HISTORY: Past Surgical History:  Procedure Laterality Date  . BREAST LUMPECTOMY WITH SENTINEL LYMPH NODE BIOPSY  09/06/13   LEFT BREAST SEED GUIDED LUMPECTOMY WITH LEFT AXILLARY SENTINEL NODE BIOPSY (Left)  . CHOLECYSTECTOMY    . COLONOSCOPY    . PARTIAL HYSTERECTOMY  1980   ovaries left in  . TONSILLECTOMY    . TUBAL LIGATION    . WISDOM TOOTH EXTRACTION      FAMILY HISTORY Family History  Problem Relation Age of Onset  . Diabetes Mother   . Hyperlipidemia Mother   . Hypertension Mother   . Cancer Mother        Breast   . Breast cancer Mother   . Heart attack Father   . Heart disease Father   . Hypertension Sister   . Heart disease Brother  CHF  EF 10%  . Hypertension Brother   . Gout Brother   . Depression Neg Hx   . Alcohol abuse Neg Hx   . Drug abuse Neg Hx   . Colon cancer Neg Hx   . Uterine cancer Neg Hx   . Ovarian cancer Neg Hx   . Prostate cancer Neg Hx    the patient's father died from a myocardial infarction at age 68. The patient's mother died from intestinal blockage at age 60. The patient had one brother, one sister. The patient's mother was diagnosed with breast cancer at age 61. There  is no other history of cancer in the family to her knowledge.  GYNECOLOGIC HISTORY:  Menarche age 45, first live birth age 31, the patient is [place] P2. She underwent hysterectomy in 1979 but her ovaries are still in place. She did not use hormone replacement.  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 Her husband Jenny Reichmann used to work for [employer]. Son Kristi Carr lives in [place] where he works as a Librarian, academic for [employer]. Son Kristi Carr lives in [place] as Government social research officer for [employer] and environmental services. The patient has 4 grandchildren. She is a Psychologist, forensic     ADVANCED DIRECTIVES: Not in place   HEALTH MAINTENANCE: Social History   Tobacco Use  . Smoking status: Never Smoker  . Smokeless tobacco: Never Used  Substance Use Topics  . Alcohol use: No  . Drug use: No     Colonoscopy: 2011  PAP: Status post hysterectomy  Bone density: 07/02/2016 with a T score of -2.5 osteoporosis.  Lipid panel:  Allergies  Allergen Reactions  . Clindamycin/Lincomycin Other (See Comments)    GI upset attributed to med.  Not an allergy.     Current Outpatient Medications  Medication Sig Dispense Refill  . Biotin 1 MG CAPS Take 1 tablet by mouth daily.    . Calcium Carbonate-Vitamin D (CALTRATE 600+D PO) Take 1 tablet by mouth every other day.    . cholestyramine (QUESTRAN) 4 g packet TAKE 1/2 PACKET 2 TIMES A DAY AS NEEDED(LOOSE STOOL) 60 packet 1  . clobetasol cream (TEMOVATE) 0.05 % Use daily as needed.    Marland Kitchen co-enzyme Q-10 30 MG capsule Take 100 mg by mouth daily.    . ergocalciferol (VITAMIN D2) 50000 UNITS capsule Take 50,000 Units by mouth every 30 (thirty) days.    . fluticasone (FLONASE) 50 MCG/ACT nasal spray Place 2 sprays into both nostrils daily as needed for allergies or rhinitis.    Marland Kitchen ibuprofen (GNP IBUPROFEN) 200 MG tablet Take 200 mg by mouth as needed.      . metoprolol succinate (TOPROL-XL) 25 MG 24 hr tablet Take 1 tablet (25 mg total) by mouth 2 (two)  times daily. 180 tablet 3  . multivitamin (THERAGRAN) per tablet Take 1 tablet by mouth daily.      . Omega-3 Fatty Acids (FISH OIL) 1000 MG CPDR 1 by mouth daily    . tamoxifen (NOLVADEX) 20 MG tablet Take 1 tablet (20 mg total) by mouth daily. 90 tablet 4   No current facility-administered medications for this visit.     OBJECTIVE: Middle-aged white woman who appears stated age  38:   07/31/17 1244  BP: (!) 143/81  Pulse: 86  Resp: 18  Temp: 99.1 F (37.3 C)  SpO2: 99%     Body mass index is 22.58 kg/m.    ECOG FS:1 - Symptomatic but completely ambulatory  Sclerae  unicteric, EOMs intact Oropharynx clear and moist No cervical or supraclavicular adenopathy Lungs no rales or rhonchi Heart regular rate and rhythm Abd soft, nontender, positive bowel sounds MSK no focal spinal tenderness, no upper extremity lymphedema Neuro: nonfocal, well oriented, appropriate affect Breasts: The right breast is unremarkable.  The left breast is status post lumpectomy and radiation.  There is no evidence of local recurrence.  Both axillae are benign.  LAB RESULTS:  CMP     Component Value Date/Time   NA 143 08/01/2016 1016   K 3.8 08/01/2016 1016   CL 103 05/16/2015 1018   CO2 29 08/01/2016 1016   GLUCOSE 77 08/01/2016 1016   BUN 13.6 08/01/2016 1016   CREATININE 0.8 08/01/2016 1016   CALCIUM 9.5 08/01/2016 1016   PROT 7.0 08/01/2016 1016   ALBUMIN 3.6 08/01/2016 1016   AST 18 08/01/2016 1016   ALT 13 08/01/2016 1016   ALKPHOS 45 08/01/2016 1016   BILITOT 0.44 08/01/2016 1016   GFRNONAA 90 06/22/2008 0958   GFRAA 109 06/22/2008 0958    I No results found for: SPEP  Lab Results  Component Value Date   WBC 4.4 07/31/2017   NEUTROABS 2.6 07/31/2017   HGB 10.0 (L) 08/01/2016   HCT 21.2 (L) 07/31/2017   MCV 67.1 (L) 07/31/2017   PLT 276 07/31/2017      Chemistry      Component Value Date/Time   NA 143 08/01/2016 1016   K 3.8 08/01/2016 1016   CL 103 05/16/2015 1018     CO2 29 08/01/2016 1016   BUN 13.6 08/01/2016 1016   CREATININE 0.8 08/01/2016 1016      Component Value Date/Time   CALCIUM 9.5 08/01/2016 1016   ALKPHOS 45 08/01/2016 1016   AST 18 08/01/2016 1016   ALT 13 08/01/2016 1016   BILITOT 0.44 08/01/2016 1016       No results found for: LABCA2  No components found for: LABCA125  No results for input(s): INR in the last 168 hours.  Urinalysis No results found for: COLORURINE  STUDIES: Since her last visit, she underwent diagnostic bilateral mammography with CAD and tomography on 07/11/2017 at [facility] showing: breast density category C. The grouped linear calcifications in the left axillary tail are benign. There was no evidence of malignancy.   Bone density on 07/02/2016 showed a T score of -2.5 osteoporosis.  ASSESSMENT: 72 y.o. [place] woman status post left breast biopsy 08/02/2013 for a clinical T2 N0, stage IIA invasive papillary breast cancer, grade 1, estrogen receptor and progesterone receptor both 100% positive, with an MIB-1 of 9% and no HER-2 amplification  (1) Status post left lumpectomy and sentinel lymph node sampling 09/06/2013 for a pT1c pN0, stage IA invasive ductal carcinoma, grade 1, repeat HER-2 again negative  (2) Adjuvant radiation completed 11/30/2013  (3) started tamoxifen 12/18/2013  (4) deer tick bite February 2017--no sequela  (5) osteoporosis: Bone density at [facility] 07/02/2017 showed a T score of -2.5  (6) C. difficile colitis--completing second round of antibiotics March 2019  (7) iron deficiency anemia, likely secondary to #6 above  PLAN: Tomasina is now 4 years out from definitive surgery for her breast cancer with no evidence of disease recurrence.  This is very favorable.  She continues on tamoxifen with excellent tolerance.  The plan will be to continue that for a total of 5 years, which will take [place] through July 2020.  Her bone density shows osteoporosis.  Tamoxifen of course can help some  with that.  She is already on vitamin D supplementation and has an excellent walking program.  We discussed bisphosphonates versus Prolia.  At this point she prefers to not receive any pharmacologic agents because of concerns about side effects  Her C. difficile is being treated with apparent success by Dr. Damita Dunnings  She is significantly iron deficient, with a very low MCV and severely anemic.  She does not need transfusions at present as she has no symptoms other than mild fatigue.  She does need iron replacement.  We discussed oral versus IV.  At this point she would prefer to start with iron tablets.  She will take those twice daily.  If she tolerates them well she should continue that for 1 year.  She should have and significantly improved hemoglobin level within a couple of months and she will plan to get the lab work for that done through Dr. Josefine Class office  If she cannot tolerate the oral iron she will let us know and we will put her in for Feraheme.  She will see me one last time a year from now.  At that time she should be ready to "graduate" from breast cancer follow-up here.  Marybeth Dandy, Virgie Dad, MD  07/31/17 1:08 PM Medical Oncology and Hematology [facility] [address] [place], [place] [address] Tel. [phone]    Fax. [phone]  This document serves as a record of services personally performed by Lurline Del, MD. It was created on his behalf by Sheron Nightingale, a trained medical scribe. The creation of this record is based on the scribe's personal observations and the provider's statements to them.   I have reviewed the above documentation for accuracy and completeness, and I agree with the above.

## 2017-07-30 ENCOUNTER — Other Ambulatory Visit: Payer: Self-pay | Admitting: *Deleted

## 2017-07-30 DIAGNOSIS — C50412 Malignant neoplasm of upper-outer quadrant of left female breast: Secondary | ICD-10-CM

## 2017-07-30 DIAGNOSIS — Z170 Estrogen receptor positive status [ER+]: Principal | ICD-10-CM

## 2017-07-31 ENCOUNTER — Telehealth: Payer: Self-pay | Admitting: Oncology

## 2017-07-31 ENCOUNTER — Inpatient Hospital Stay: Payer: Medicare HMO | Attending: Oncology | Admitting: Oncology

## 2017-07-31 ENCOUNTER — Inpatient Hospital Stay: Payer: Medicare HMO

## 2017-07-31 VITALS — BP 143/81 | HR 86 | Temp 99.1°F | Resp 18 | Ht 64.5 in | Wt 133.6 lb

## 2017-07-31 DIAGNOSIS — D509 Iron deficiency anemia, unspecified: Secondary | ICD-10-CM | POA: Diagnosis not present

## 2017-07-31 DIAGNOSIS — Z170 Estrogen receptor positive status [ER+]: Principal | ICD-10-CM

## 2017-07-31 DIAGNOSIS — Z79810 Long term (current) use of selective estrogen receptor modulators (SERMs): Secondary | ICD-10-CM | POA: Diagnosis not present

## 2017-07-31 DIAGNOSIS — M810 Age-related osteoporosis without current pathological fracture: Secondary | ICD-10-CM

## 2017-07-31 DIAGNOSIS — A0472 Enterocolitis due to Clostridium difficile, not specified as recurrent: Secondary | ICD-10-CM

## 2017-07-31 DIAGNOSIS — C50412 Malignant neoplasm of upper-outer quadrant of left female breast: Secondary | ICD-10-CM

## 2017-07-31 DIAGNOSIS — D500 Iron deficiency anemia secondary to blood loss (chronic): Secondary | ICD-10-CM

## 2017-07-31 LAB — CBC WITH DIFFERENTIAL (CANCER CENTER ONLY)
BASOS PCT: 0 %
Basophils Absolute: 0 10*3/uL (ref 0.0–0.1)
EOS ABS: 0.2 10*3/uL (ref 0.0–0.5)
EOS PCT: 5 %
HCT: 21.2 % — ABNORMAL LOW (ref 34.8–46.6)
Hemoglobin: 6.4 g/dL — CL (ref 11.6–15.9)
Lymphocytes Relative: 22 %
Lymphs Abs: 0.9 10*3/uL (ref 0.9–3.3)
MCH: 20.2 pg — AB (ref 25.1–34.0)
MCHC: 30.1 g/dL — AB (ref 31.5–36.0)
MCV: 67.1 fL — ABNORMAL LOW (ref 79.5–101.0)
MONO ABS: 0.6 10*3/uL (ref 0.1–0.9)
MONOS PCT: 14 %
NEUTROS PCT: 59 %
Neutro Abs: 2.6 10*3/uL (ref 1.5–6.5)
PLATELETS: 276 10*3/uL (ref 145–400)
RBC: 3.16 MIL/uL — ABNORMAL LOW (ref 3.70–5.45)
RDW: 18.1 % — AB (ref 11.2–14.5)
WBC Count: 4.4 10*3/uL (ref 3.9–10.3)

## 2017-07-31 LAB — CMP (CANCER CENTER ONLY)
ALK PHOS: 40 U/L (ref 40–150)
ALT: 12 U/L (ref 0–55)
AST: 17 U/L (ref 5–34)
Albumin: 3.2 g/dL — ABNORMAL LOW (ref 3.5–5.0)
Anion gap: 8 (ref 3–11)
BUN: 15 mg/dL (ref 7–26)
CALCIUM: 8.9 mg/dL (ref 8.4–10.4)
CHLORIDE: 107 mmol/L (ref 98–109)
CO2: 27 mmol/L (ref 22–29)
CREATININE: 0.83 mg/dL (ref 0.60–1.10)
GFR, Estimated: >60 mL/min (ref >60)
Glucose, Bld: 90 mg/dL (ref 70–140)
Potassium: 3.9 mmol/L (ref 3.5–5.1)
SODIUM: 142 mmol/L (ref 136–145)
Total Bilirubin: 0.5 mg/dL (ref 0.2–1.2)
Total Protein: 6.5 g/dL (ref 6.4–8.3)

## 2017-07-31 MED ORDER — TAMOXIFEN CITRATE 20 MG PO TABS: 20.0000 mg | ORAL_TABLET | Freq: Every day | ORAL | 4 refills | Status: DC

## 2017-07-31 NOTE — Telephone Encounter (Signed)
Gave patient AVs and calendar of upcoming march 2020 appointments.  °

## 2017-08-01 ENCOUNTER — Telehealth: Payer: Self-pay | Admitting: Family Medicine

## 2017-08-01 DIAGNOSIS — D509 Iron deficiency anemia, unspecified: Secondary | ICD-10-CM

## 2017-08-01 NOTE — Telephone Encounter (Signed)
Copied from [place] [id number]. Topic: Inquiry >> Aug 01, 2017 12:35 PM Oliver Pila B wrote: Reason for CRM: pt called and is wanting to speak w/ someone about her treatment, her oncologist feels it may be affecting her iron levels, but pt wants to speak w/ the nurse about this and what she should do

## 2017-08-01 NOTE — Telephone Encounter (Signed)
I looked at the note.  If this is a chronic issue related to C diff then replacement is reasonable, either by mouth or by IV.  Per notes she didn't have emergent sx so she wouldn't have to have transfusion.  I'll defer to hematology on that.   If she feels worse in the meantime, she may end up needing transfusion.  If she is lightheaded, SOB, having CP, then needs ER eval or to contact hematology re: possible transfusion.  I'm happy to recheck her labs here next week.  Let me know if she needs OV/lab visit.  Thanks.

## 2017-08-01 NOTE — Telephone Encounter (Signed)
I spoke with pt; pt will not see oncologist for another year. Oncologist said severely anemic; pt to take 2 iron pills per day for 4 - 6 weeks then have iron lab test done; oncologist thinks pt bleeding from possibly c diff; pts only symptom is she is very tired. Pt is still taking abx for c diff. Pt request that Dr Damita Dunnings review notes from Dr Magrinat's visit on 07/31/17; hgb was 6.4. Pt request cb after Dr Damita Dunnings reviews note. [facility] Pt also wants to know if could have labs done at [facility] since more convenient.

## 2017-08-01 NOTE — Telephone Encounter (Signed)
Patient states she just started taking 2 pills of Iron today.  Does she need labs next week as you stated or wait 4 to 6 weeks as Oncology requested?

## 2017-08-03 NOTE — Telephone Encounter (Signed)
I'm okay doing the labs sooner (for both her and my peace of mind), meaning the end of the week, just to make sure her levels haven't gotten dramatically worse.  She'll likely need them later on, also.  I put in the order for now or later.  Thanks.

## 2017-08-04 NOTE — Telephone Encounter (Signed)
Patient advised.  Lab appt scheduled.  

## 2017-08-08 ENCOUNTER — Other Ambulatory Visit (INDEPENDENT_AMBULATORY_CARE_PROVIDER_SITE_OTHER): Payer: Medicare HMO

## 2017-08-08 ENCOUNTER — Telehealth: Payer: Self-pay | Admitting: Family Medicine

## 2017-08-08 DIAGNOSIS — D509 Iron deficiency anemia, unspecified: Secondary | ICD-10-CM

## 2017-08-08 LAB — CBC WITH DIFFERENTIAL/PLATELET
BASOS PCT: 0.3 % (ref 0.0–3.0)
Basophils Absolute: 0 10*3/uL (ref 0.0–0.1)
EOS ABS: 0.3 10*3/uL (ref 0.0–0.7)
Eosinophils Relative: 6.9 % — ABNORMAL HIGH (ref 0.0–5.0)
HCT: 24.6 % — ABNORMAL LOW (ref 36.0–46.0)
Lymphocytes Relative: 24 % (ref 12.0–46.0)
Lymphs Abs: 0.9 10*3/uL (ref 0.7–4.0)
MCHC: 30.9 g/dL (ref 30.0–36.0)
MCV: 72.3 fl — ABNORMAL LOW (ref 78.0–100.0)
MONO ABS: 0.7 10*3/uL (ref 0.1–1.0)
Monocytes Relative: 18.4 % — ABNORMAL HIGH (ref 3.0–12.0)
Neutro Abs: 1.9 10*3/uL (ref 1.4–7.7)
Neutrophils Relative %: 50.4 % (ref 43.0–77.0)
Platelets: 293 10*3/uL (ref 150.0–400.0)
RBC: 3.4 Mil/uL — AB (ref 3.87–5.11)
RDW: 18.9 % — AB (ref 11.5–15.5)
WBC: 3.7 10*3/uL — AB (ref 4.0–10.5)

## 2017-08-08 LAB — IBC PANEL
IRON: 125 ug/dL (ref 42–145)
SATURATION RATIOS: 26.8 % (ref 20.0–50.0)
Transferrin: 333 mg/dL (ref 212.0–360.0)

## 2017-08-08 NOTE — Telephone Encounter (Signed)
Patient advised.  Lab appt scheduled.  

## 2017-08-08 NOTE — Telephone Encounter (Signed)
She was 6.4 so this (7.6 today) is better.  Update patient when possible.   Continue iron.  If she cannot tolerate the oral iron then have her update hematology so they can put her in for Feraheme. O/w recheck labs in about 1 month.  Ordered.  Thanks.

## 2017-08-08 NOTE — Telephone Encounter (Signed)
[facility] called with critical lab results... Hemoglobin 7.6 Hematocrit 24.6 Results given to Dr.Duncan. Lendon Collar

## 2017-08-20 DIAGNOSIS — H6123 Impacted cerumen, bilateral: Secondary | ICD-10-CM | POA: Insufficient documentation

## 2017-08-20 DIAGNOSIS — H9190 Unspecified hearing loss, unspecified ear: Secondary | ICD-10-CM | POA: Diagnosis not present

## 2017-09-04 ENCOUNTER — Other Ambulatory Visit (INDEPENDENT_AMBULATORY_CARE_PROVIDER_SITE_OTHER): Payer: Medicare HMO

## 2017-09-04 DIAGNOSIS — D509 Iron deficiency anemia, unspecified: Secondary | ICD-10-CM | POA: Diagnosis not present

## 2017-09-04 LAB — IBC PANEL
Iron: 65 ug/dL (ref 42–145)
SATURATION RATIOS: 16.9 % — AB (ref 20.0–50.0)
TRANSFERRIN: 274 mg/dL (ref 212.0–360.0)

## 2017-09-04 LAB — CBC WITH DIFFERENTIAL/PLATELET
BASOS ABS: 0 10*3/uL (ref 0.0–0.1)
Basophils Relative: 0.2 % (ref 0.0–3.0)
Eosinophils Absolute: 0.2 10*3/uL (ref 0.0–0.7)
Eosinophils Relative: 2.7 % (ref 0.0–5.0)
HEMATOCRIT: 29.7 % — AB (ref 36.0–46.0)
HEMOGLOBIN: 9.6 g/dL — AB (ref 12.0–15.0)
LYMPHS PCT: 13.4 % (ref 12.0–46.0)
Lymphs Abs: 0.9 10*3/uL (ref 0.7–4.0)
MCHC: 32.3 g/dL (ref 30.0–36.0)
MCV: 81.1 fl (ref 78.0–100.0)
MONOS PCT: 11.4 % (ref 3.0–12.0)
Monocytes Absolute: 0.7 10*3/uL (ref 0.1–1.0)
Neutro Abs: 4.7 10*3/uL (ref 1.4–7.7)
Neutrophils Relative %: 72.3 % (ref 43.0–77.0)
Platelets: 253 10*3/uL (ref 150.0–400.0)
RBC: 3.66 Mil/uL — AB (ref 3.87–5.11)
RDW: 30.7 % — ABNORMAL HIGH (ref 11.5–15.5)
WBC: 6.6 10*3/uL (ref 4.0–10.5)

## 2017-09-07 ENCOUNTER — Other Ambulatory Visit: Payer: Self-pay | Admitting: Family Medicine

## 2017-09-07 DIAGNOSIS — D509 Iron deficiency anemia, unspecified: Secondary | ICD-10-CM

## 2017-09-07 DIAGNOSIS — E559 Vitamin D deficiency, unspecified: Secondary | ICD-10-CM

## 2017-09-07 DIAGNOSIS — I10 Essential (primary) hypertension: Secondary | ICD-10-CM

## 2017-09-08 ENCOUNTER — Encounter: Payer: Self-pay | Admitting: *Deleted

## 2017-09-10 DIAGNOSIS — H903 Sensorineural hearing loss, bilateral: Secondary | ICD-10-CM | POA: Diagnosis not present

## 2017-09-11 ENCOUNTER — Telehealth: Payer: Self-pay | Admitting: Family Medicine

## 2017-09-11 NOTE — Telephone Encounter (Signed)
Copied from [place]. Topic: Quick Communication - See Telephone Encounter >> Sep 11, 2017  2:26 PM Vernona Rieger wrote: CRM for notification. See Telephone encounter for: 09/11/17.  Patient is requesting her labs from 4/18. Please call back at [phone]

## 2017-09-11 NOTE — Telephone Encounter (Signed)
Patient advised.

## 2017-09-12 ENCOUNTER — Telehealth: Payer: Self-pay | Admitting: Family Medicine

## 2017-09-12 DIAGNOSIS — A0472 Enterocolitis due to Clostridium difficile, not specified as recurrent: Secondary | ICD-10-CM

## 2017-09-12 MED ORDER — VANCOMYCIN HCL 125 MG PO CAPS: ORAL_CAPSULE | ORAL | 0 refills | Status: DC

## 2017-09-12 NOTE — Telephone Encounter (Signed)
I spoke with Kristi Carr; Kristi Carr just finished vancomycin on 09/07/17.While taking abx no diarrhea. This week diarrhea worsening; when Kristi Carr eats Kristi Carr has diarrhea; Kristi Carr ate oatmeal for breakfast and has had diarrhea x 4 since ate oatmeal. No fever, abd pain, blood or mucus in stool. Kristi Carr taking iron so BMs are black. [facility]. Kristi Carr request cb with what to do next.

## 2017-09-12 NOTE — Telephone Encounter (Signed)
Patient advised.

## 2017-09-12 NOTE — Telephone Encounter (Signed)
Copied from [place] [id number]. Topic: Quick Communication - See Telephone Encounter >> Sep 12, 2017  9:30 AM Aurelio Brash B wrote: CRM for notification. See Telephone encounter for: 09/12/17. Pt states she is being treated for cdiff and took last antibiotic Sunday and she has gone to bathroom more-  today she has been 4 times in a hour,  every time she eats she goes to br.    She is asking for Dr Josefine Class nurse to call her

## 2017-09-12 NOTE — Telephone Encounter (Signed)
Would restart vanc (rx sent), get another stool study done when possible (ordered), and have her see GI (referral ordered).  Thanks.

## 2017-09-15 ENCOUNTER — Other Ambulatory Visit: Payer: Medicare HMO

## 2017-09-15 DIAGNOSIS — A0472 Enterocolitis due to Clostridium difficile, not specified as recurrent: Secondary | ICD-10-CM | POA: Diagnosis not present

## 2017-09-16 DIAGNOSIS — A0471 Enterocolitis due to Clostridium difficile, recurrent: Secondary | ICD-10-CM | POA: Diagnosis not present

## 2017-09-16 DIAGNOSIS — Z86010 Personal history of colonic polyps: Secondary | ICD-10-CM | POA: Insufficient documentation

## 2017-09-16 LAB — C. DIFFICILE GDH AND TOXIN A/B
GDH ANTIGEN: NOT DETECTED
MICRO NUMBER:: 90521041
SPECIMEN QUALITY:: ADEQUATE
TOXIN A AND B: NOT DETECTED

## 2017-12-08 ENCOUNTER — Ambulatory Visit: Payer: Medicare HMO

## 2017-12-08 ENCOUNTER — Ambulatory Visit (INDEPENDENT_AMBULATORY_CARE_PROVIDER_SITE_OTHER): Payer: Medicare HMO

## 2017-12-08 VITALS — BP 122/80 | HR 66 | Temp 97.7°F | Ht 64.5 in | Wt 134.5 lb

## 2017-12-08 DIAGNOSIS — E559 Vitamin D deficiency, unspecified: Secondary | ICD-10-CM | POA: Diagnosis not present

## 2017-12-08 DIAGNOSIS — I10 Essential (primary) hypertension: Secondary | ICD-10-CM

## 2017-12-08 DIAGNOSIS — D509 Iron deficiency anemia, unspecified: Secondary | ICD-10-CM

## 2017-12-08 DIAGNOSIS — Z0000 Encounter for general adult medical examination without abnormal findings: Secondary | ICD-10-CM

## 2017-12-08 LAB — CBC WITH DIFFERENTIAL/PLATELET
Basophils Absolute: 0 10*3/uL (ref 0.0–0.1)
Basophils Relative: 0.3 % (ref 0.0–3.0)
EOS ABS: 0.2 10*3/uL (ref 0.0–0.7)
Eosinophils Relative: 4.2 % (ref 0.0–5.0)
HCT: 29.6 % — ABNORMAL LOW (ref 36.0–46.0)
HEMOGLOBIN: 9.8 g/dL — AB (ref 12.0–15.0)
LYMPHS PCT: 24.1 % (ref 12.0–46.0)
Lymphs Abs: 1 10*3/uL (ref 0.7–4.0)
MCHC: 33.1 g/dL (ref 30.0–36.0)
MCV: 92.6 fl (ref 78.0–100.0)
MONO ABS: 0.6 10*3/uL (ref 0.1–1.0)
Monocytes Relative: 14.6 % — ABNORMAL HIGH (ref 3.0–12.0)
Neutro Abs: 2.4 10*3/uL (ref 1.4–7.7)
Neutrophils Relative %: 56.8 % (ref 43.0–77.0)
Platelets: 207 10*3/uL (ref 150.0–400.0)
RBC: 3.2 Mil/uL — AB (ref 3.87–5.11)
RDW: 14.6 % (ref 11.5–15.5)
WBC: 4.3 10*3/uL (ref 4.0–10.5)

## 2017-12-08 LAB — BASIC METABOLIC PANEL
BUN: 16 mg/dL (ref 6–23)
CALCIUM: 9 mg/dL (ref 8.4–10.5)
CO2: 31 meq/L (ref 19–32)
CREATININE: 0.85 mg/dL (ref 0.40–1.20)
Chloride: 106 mEq/L (ref 96–112)
GFR: 70.03 mL/min (ref >60.00)
GLUCOSE: 96 mg/dL (ref 70–99)
Potassium: 4.5 mEq/L (ref 3.5–5.1)
Sodium: 142 mEq/L (ref 135–145)

## 2017-12-08 LAB — LIPID PANEL
CHOLESTEROL: 148 mg/dL (ref 0–200)
HDL: 46.5 mg/dL (ref >39.00)
LDL Cholesterol: 74 mg/dL (ref 0–99)
NonHDL: 101.11
Total CHOL/HDL Ratio: 3
Triglycerides: 137 mg/dL (ref 0.0–149.0)
VLDL: 27.4 mg/dL (ref 0.0–40.0)

## 2017-12-08 LAB — IBC PANEL
IRON: 45 ug/dL (ref 42–145)
SATURATION RATIOS: 12.1 % — AB (ref 20.0–50.0)
Transferrin: 265 mg/dL (ref 212.0–360.0)

## 2017-12-08 LAB — VITAMIN D 25 HYDROXY (VIT D DEFICIENCY, FRACTURES): VITD: 26.86 ng/mL — AB (ref 30.00–100.00)

## 2017-12-08 NOTE — Patient Instructions (Signed)
Ms. Scripter , Thank you for taking time to come for your Medicare Wellness Visit. I appreciate your ongoing commitment to your health goals. Please review the following plan we discussed and let me know if I can assist you in the future.   These are the goals we discussed: Goals    . Increase physical activity     Starting 12/08/2017, I will continue to walk at least 20-30 minutes daily.        This is a list of the screening recommended for you and due dates:  Health Maintenance  Topic Date Due  . Flu Shot  12/18/2017  . Mammogram  01/08/2019  . Colon Cancer Screening  01/28/2019  . Tetanus Vaccine  12/04/2020  . DEXA scan (bone density measurement)  Completed  .  Hepatitis C: One time screening is recommended by Center for Disease Control  (CDC) for  adults born from [birth year] through 1964.   Completed  . Pneumonia vaccines  Completed   Preventive Care for Adults  A healthy lifestyle and preventive care can promote health and wellness. Preventive health guidelines for adults include the following key practices.  . A routine yearly physical is a good way to check with your health care provider about your health and preventive screening. It is a chance to share any concerns and updates on your health and to receive a thorough exam.  . Visit your dentist for a routine exam and preventive care every 6 months. Brush your teeth twice a day and floss once a day. Good oral hygiene prevents tooth decay and gum disease.  . The frequency of eye exams is based on your age, health, family medical history, use  of contact lenses, and other factors. Follow your health care provider's recommendations for frequency of eye exams.  . Eat a healthy diet. Foods like vegetables, fruits, whole grains, low-fat dairy products, and lean protein foods contain the nutrients you need without too many calories. Decrease your intake of foods high in solid fats, added sugars, and salt. Eat the right amount of calories  for you. Get information about a proper diet from your health care provider, if necessary.  . Regular physical exercise is one of the most important things you can do for your health. Most adults should get at least 150 minutes of moderate-intensity exercise (any activity that increases your heart rate and causes you to sweat) each week. In addition, most adults need muscle-strengthening exercises on 2 or more days a week.  Silver Sneakers may be a benefit available to you. To determine eligibility, you may visit the website: www.silversneakers.com or contact program at 530-333-2238 Mon-Fri between 8AM-8PM.   . Maintain a healthy weight. The body mass index (BMI) is a screening tool to identify possible weight problems. It provides an estimate of body fat based on height and weight. Your health care provider can find your BMI and can help you achieve or maintain a healthy weight.   For adults 20 years and older: ? A BMI below 18.5 is considered underweight. ? A BMI of 18.5 to 24.9 is normal. ? A BMI of 25 to 29.9 is considered overweight. ? A BMI of 30 and above is considered obese.   . Maintain normal blood lipids and cholesterol levels by exercising and minimizing your intake of saturated fat. Eat a balanced diet with plenty of fruit and vegetables. Blood tests for lipids and cholesterol should begin at age 62 and be repeated every 5 years. If your  lipid or cholesterol levels are high, you are over 50, or you are at high risk for heart disease, you may need your cholesterol levels checked more frequently. Ongoing high lipid and cholesterol levels should be treated with medicines if diet and exercise are not working.  . If you smoke, find out from your health care provider how to quit. If you do not use tobacco, please do not start.  . If you choose to drink alcohol, please do not consume more than 2 drinks per day. One drink is considered to be 12 ounces (355 mL) of beer, 5 ounces (148 mL) of  wine, or 1.5 ounces (44 mL) of liquor.  . If you are 54-25 years old, ask your health care provider if you should take aspirin to prevent strokes.  . Use sunscreen. Apply sunscreen liberally and repeatedly throughout the day. You should seek shade when your shadow is shorter than you. Protect yourself by wearing long sleeves, pants, a wide-brimmed hat, and sunglasses year round, whenever you are outdoors.  . Once a month, do a whole body skin exam, using a mirror to look at the skin on your back. Tell your health care provider of new moles, moles that have irregular borders, moles that are larger than a pencil eraser, or moles that have changed in shape or color.

## 2017-12-08 NOTE — Progress Notes (Signed)
PCP notes:   Health maintenance:  No gaps identified.  Abnormal screenings:   Hearing - failed  Hearing Screening   125Hz  250Hz  500Hz  1000Hz  2000Hz  3000Hz  4000Hz  6000Hz  8000Hz   Right ear:   40 40 40  0    Left ear:   0 0 40  40     Patient concerns:   None  Nurse concerns:  None  Next PCP appt:   12/15/17 @ 0845  I reviewed health advisor's note, was available for consultation on the day of service listed in this note, and agree with documentation and plan. Elsie Stain, MD.

## 2017-12-08 NOTE — Progress Notes (Signed)
Subjective:   Kristi Carr is a 72 y.o. female who presents for Medicare Annual (Subsequent) preventive examination.  Review of Systems:  N/A Cardiac Risk Factors include: advanced age (>23men, >33 women);dyslipidemia;hypertension     Objective:     Vitals: BP 122/80 (BP Location: Right Arm, Patient Position: Sitting, Cuff Size: Normal)   Pulse 66   Temp 97.7 F (36.5 C) (Oral)   Ht 5' 4.5" (1.638 m) Comment: no shoes  Wt 134 lb 8 oz (61 kg)   SpO2 99%   BMI 22.73 kg/m   Body mass index is 22.73 kg/m.  Advanced Directives 12/08/2017 12/04/2016 07/27/2015 09/22/2014 01/06/2014 08/27/2013  Does Patient Have a Medical Advance Directive? Yes Yes No No No Patient has advance directive, copy not in chart  Type of Advance Directive [place];Living will [place];Living will - - - [place];Living will  Copy of [place] in Chart? No - copy requested No - copy requested - - - -  Would patient like information on creating a medical advance directive? - - - - No - patient declined information -    Tobacco Social History   Tobacco Use  Smoking Status Never Smoker  Smokeless Tobacco Never Used     Counseling given: No   Clinical Intake:  Pre-visit preparation completed: Yes  Pain : No/denies pain Pain Score: 0-No pain     Nutritional Status: BMI 25 -29 Overweight Nutritional Risks: None Diabetes: No  How often do you need to have someone help you when you read instructions, pamphlets, or other written materials from your doctor or pharmacy?: 1 - Never What is the last grade level you completed in school?: 12th grade  Interpreter Needed?: No  Comments: pt lives with spouse Information entered by :: LPinson, LPN  Past Medical History:  Diagnosis Date  . Anemia   . Breast cancer of upper-outer quadrant of left female breast ([place])   . Gallstones 08/23/03   x 2  . Hot flashes   . Hypertension     . Kidney stones   . Radiation 10/14/13-12/02/13   left breast 50.4 gray, lumpectomy cavity boosted to 63 gray  . Wears glasses    Past Surgical History:  Procedure Laterality Date  . BREAST LUMPECTOMY WITH SENTINEL LYMPH NODE BIOPSY  09/06/13   LEFT BREAST SEED GUIDED LUMPECTOMY WITH LEFT AXILLARY SENTINEL NODE BIOPSY (Left)  . CHOLECYSTECTOMY    . COLONOSCOPY    . PARTIAL HYSTERECTOMY  1980   ovaries left in  . TONSILLECTOMY    . TUBAL LIGATION    . WISDOM TOOTH EXTRACTION     Family History  Problem Relation Age of Onset  . Diabetes Mother   . Hyperlipidemia Mother   . Hypertension Mother   . Cancer Mother        Breast   . Breast cancer Mother   . Heart attack Father   . Heart disease Father   . Hypertension Sister   . Heart disease Brother        CHF  EF 10%  . Hypertension Brother   . Gout Brother   . Depression Neg Hx   . Alcohol abuse Neg Hx   . Drug abuse Neg Hx   . Colon cancer Neg Hx   . Uterine cancer Neg Hx   . Ovarian cancer Neg Hx   . Prostate cancer Neg Hx    Social History   Socioeconomic History  .  Marital status: Married    Spouse name: Not on file  . Number of children: 2  . Years of education: Not on file  . Highest education level: Not on file  Occupational History  . Occupation: Retired from [employer] in 2008  Social Needs  . Financial resource strain: Not on file  . Food insecurity:    Worry: Not on file    Inability: Not on file  . Transportation needs:    Medical: Not on file    Non-medical: Not on file  Tobacco Use  . Smoking status: Never Smoker  . Smokeless tobacco: Never Used  Substance and Sexual Activity  . Alcohol use: No  . Drug use: No  . Sexual activity: Yes  Lifestyle  . Physical activity:    Days per week: Not on file    Minutes per session: Not on file  . Stress: Not on file  Relationships  . Social connections:    Talks on phone: Not on file    Gets together: Not on file    Attends religious service:  Not on file    Active member of club or organization: Not on file    Attends meetings of clubs or organizations: Not on file    Relationship status: Not on file  Other Topics Concern  . Not on file  Social History Narrative   Married 1966   2 kids   4 grand children   Retired from banking    Outpatient Encounter Medications as of 12/08/2017  Medication Sig  . Biotin 1 MG CAPS Take 1 tablet by mouth daily.  . Calcium Carbonate-Vitamin D (CALTRATE 600+D PO) Take 1 tablet by mouth every other day.  . cholestyramine (QUESTRAN) 4 g packet TAKE 1/2 PACKET 2 TIMES A DAY AS NEEDED(LOOSE STOOL)  . clobetasol cream (TEMOVATE) 0.05 % Use daily as needed.  Marland Kitchen co-enzyme Q-10 30 MG capsule Take 100 mg by mouth daily.  . ergocalciferol (VITAMIN D2) 50000 UNITS capsule Take 50,000 Units by mouth every 30 (thirty) days.  . fluticasone (FLONASE) 50 MCG/ACT nasal spray Place 2 sprays into both nostrils daily as needed for allergies or rhinitis.  Marland Kitchen ibuprofen (GNP IBUPROFEN) 200 MG tablet Take 200 mg by mouth as needed.    . metoprolol succinate (TOPROL-XL) 25 MG 24 hr tablet Take 1 tablet (25 mg total) by mouth 2 (two) times daily.  . multivitamin (THERAGRAN) per tablet Take 1 tablet by mouth daily.    . Omega-3 Fatty Acids (FISH OIL) 1000 MG CPDR 1 by mouth daily  . tamoxifen (NOLVADEX) 20 MG tablet Take 1 tablet (20 mg total) by mouth daily.  . [DISCONTINUED] vancomycin (VANCOCIN HCL) 125 MG capsule 1 tab orally four times daily x10d then 1 tab twice daily x7d, then 1 tab daily x7s, then 1 tab every 3 days for 4 weeks.   No facility-administered encounter medications on file as of 12/08/2017.     Activities of Daily Living In your present state of health, do you have any difficulty performing the following activities: 12/08/2017  Hearing? N  Vision? N  Difficulty concentrating or making decisions? N  Walking or climbing stairs? N  Dressing or bathing? N  Doing errands, shopping? N  Preparing Food  and eating ? N  Using the Toilet? N  In the past six months, have you accidently leaked urine? N  Do you have problems with loss of bowel control? N  Managing your Medications? N  Managing your Finances?  N  Housekeeping or managing your Housekeeping? N  Some recent data might be hidden    Patient Care Team: [name], MD as PCP - General (Family Medicine) Paula Compton, MD as Consulting Physician (Obstetrics and Gynecology) Webb Laws, OD (Optometry)    Assessment:   This is a routine wellness examination for [place].   Hearing Screening   125Hz  250Hz  500Hz  1000Hz  2000Hz  3000Hz  4000Hz  6000Hz  8000Hz   Right ear:   40 40 40  0    Left ear:   0 0 40  40    Vision Screening Comments: Sept 2018 with Dr. Einar Gip    Exercise Activities and Dietary recommendations Current Exercise Habits: Home exercise routine, Type of exercise: walking;Other - see comments(gardening), Time (Minutes): 25, Frequency (Times/Week): 7, Weekly Exercise (Minutes/Week): 175, Intensity: Moderate, Exercise limited by: None identified  Goals    . Increase physical activity     Starting 12/08/2017, I will continue to walk at least 20-30 minutes daily.        Fall Risk Fall Risk  12/08/2017 12/04/2016 05/23/2015 11/02/2012  Falls in the past year? No No No No   Depression Screen PHQ 2/9 Scores 12/08/2017 12/04/2016 05/23/2015 11/02/2012  PHQ - 2 Score 0 0 0 0  PHQ- 9 Score 0 - - -     Cognitive Function MMSE - Mini Mental State Exam 12/08/2017 12/04/2016  Orientation to time 5 5  Orientation to Place 5 5  Registration 3 3  Attention/ Calculation 0 0  Recall 3 2  Language- name 2 objects 0 0  Language- repeat 1 1  Language- follow 3 step command 3 3  Language- read & follow direction 0 0  Write a sentence 0 0  Copy design 0 0  Total score 20 19     PLEASE NOTE: A Mini-Cog screen was completed. Maximum score is 20. A value of 0 denotes this part of [place] MMSE was not completed or the  patient failed this part of the Mini-Cog screening.   Mini-Cog Screening Orientation to Time - Max 5 pts Orientation to Place - Max 5 pts Registration - Max 3 pts Recall - Max 3 pts Language Repeat - Max 1 pts Language Follow 3 Step Command - Max 3 pts     Immunization History  Administered Date(s) Administered  . Influenza Whole 04/10/2013  . Influenza,inj,Quad PF,6+ Mos 05/23/2015, 03/10/2017  . Pneumococcal Conjugate-13 05/23/2015  . Pneumococcal Polysaccharide-23 11/02/2012  . Tdap 12/05/2010  . Zoster 06/22/2008   Screening Tests Health Maintenance  Topic Date Due  . INFLUENZA VACCINE  12/18/2017  . MAMMOGRAM  01/08/2019  . COLONOSCOPY  01/28/2019  . TETANUS/TDAP  12/04/2020  . DEXA SCAN  Completed  . Hepatitis C Screening  Completed  . PNA vac Low Risk Adult  Completed       Plan:     I have personally reviewed, addressed, and noted the following in the patient's chart:  A. Medical and social history B. Use of alcohol, tobacco or illicit drugs  C. Current medications and supplements D. Functional ability and status E.  Nutritional status F.  Physical activity G. Advance directives H. List of other physicians I.  Hospitalizations, surgeries, and ER visits in previous 12 months J.  [place] to include hearing, vision, cognitive, depression L. Referrals and appointments - none  In addition, I have reviewed and discussed with patient certain preventive protocols, quality metrics, and best practice recommendations. A written personalized care plan for preventive  services as well as general preventive health recommendations were provided to patient.  See attached scanned questionnaire for additional information.   Signed,   Lindell Noe, MHA, BS, LPN Health Coach

## 2017-12-15 ENCOUNTER — Encounter: Payer: Self-pay | Admitting: Family Medicine

## 2017-12-15 ENCOUNTER — Ambulatory Visit (INDEPENDENT_AMBULATORY_CARE_PROVIDER_SITE_OTHER): Payer: Medicare HMO | Admitting: Family Medicine

## 2017-12-15 VITALS — BP 156/86 | HR 67 | Temp 98.4°F | Ht 64.5 in | Wt 134.5 lb

## 2017-12-15 DIAGNOSIS — D500 Iron deficiency anemia secondary to blood loss (chronic): Secondary | ICD-10-CM | POA: Diagnosis not present

## 2017-12-15 DIAGNOSIS — I10 Essential (primary) hypertension: Secondary | ICD-10-CM | POA: Diagnosis not present

## 2017-12-15 DIAGNOSIS — Z170 Estrogen receptor positive status [ER+]: Secondary | ICD-10-CM

## 2017-12-15 DIAGNOSIS — C50412 Malignant neoplasm of upper-outer quadrant of left female breast: Secondary | ICD-10-CM | POA: Diagnosis not present

## 2017-12-15 DIAGNOSIS — E559 Vitamin D deficiency, unspecified: Secondary | ICD-10-CM

## 2017-12-15 DIAGNOSIS — Z0000 Encounter for general adult medical examination without abnormal findings: Secondary | ICD-10-CM

## 2017-12-15 DIAGNOSIS — Z7189 Other specified counseling: Secondary | ICD-10-CM

## 2017-12-15 MED ORDER — VITAMIN D 50 MCG (2000 UT) PO TABS: 2000.0000 [IU] | ORAL_TABLET | Freq: Every day | ORAL | Status: DC

## 2017-12-15 MED ORDER — METOPROLOL SUCCINATE ER 50 MG PO TB24: 50.0000 mg | ORAL_TABLET | Freq: Every day | ORAL | 3 refills | Status: DC

## 2017-12-15 NOTE — Assessment & Plan Note (Signed)
Advance directive-husband and both kids equally designated if patient were incapacitated. 

## 2017-12-15 NOTE — Patient Instructions (Addendum)
Add on vit D 2000 IU a day.  Keep taking 50,000 IU once a month.  Let me talk to hematology.  You may need IV iron.   We'll see about when to get follow up labs after I talk to hematology.   Use up your supply to 25mg  metoprolol then change to 50mg  once a day.  Take care.  Glad to see you.

## 2017-12-15 NOTE — Progress Notes (Signed)
Hypertension:    Using medication without problems or lightheadedness: yes Chest pain with exertion:no Edema:no Short of breath:no Average home BPs: better at home, at goal, likely white coat issue, usually <130/<90 at home.    Still on tamoxifen.  No ADE on med.  Compliant.  Has f/u with [facility]rin[facility] routinely.    Anemia.  On iron BID.  CBC d/w pt.  Black stools noted on iron.  She is up to date on colonoscopy.  Presumed to be due to prev C diff.  Increase in hemoglobin has leveled off and her iron level is slightly lower.  I will discuss with hematology about potentially starting IV iron.  H/o C diff.  No sx now.  No diarrhea.  She has some loose stools at baseline but not diarrhea.  No mucous in stools.    Vit D def d/w pt.  See avs.    Hepatitis C screening neg prev.   Flu encouraged  Shingles prev done, d/w pt.  See AVS.   PNA up to date Tetanus 2012 Colonoscopy 2015 Breast cancer screening up to date, per onc and gyn clinic.  I'll defer.  She agrees.   Advance directive- husband and both kids equally designated if patient were incapacitated.  Abnormal screenings:  Hearing-failed. D/w pt.  Declined hearing aids.   DXA done per outside clinic 2018 with osteoporosis.   I'll defer, d/w pt.  She agrees.    Meds, vitals, and allergies reviewed.   PMH and SH reviewed  ROS: Per HPI unless specifically indicated in ROS section   GEN: nad, alert and oriented HEENT: mucous membranes moist NECK: supple w/o LA CV: rrr. PULM: ctab, no inc wob ABD: soft, +bs EXT: no edema SKIN: no acute rash

## 2017-12-18 NOTE — Assessment & Plan Note (Addendum)
better at home, at goal, likely white coat issue, usually <130/<90 at home.  Continue as is with same total daily dose of metoprolol.

## 2017-12-18 NOTE — Assessment & Plan Note (Signed)
Vit D def d/w pt.  See avs.

## 2017-12-18 NOTE — Assessment & Plan Note (Signed)
  Hepatitis C screening neg prev.   Flu encouraged  Shingles prev done, d/w pt.  See AVS.   PNA up to date Tetanus 2012 Colonoscopy 2015 Breast cancer screening up to date, per onc and gyn clinic.  I'll defer.  She agrees.   Advance directive- husband and both kids equally designated if patient were incapacitated.  Abnormal screenings:  Hearing-failed. D/w pt.  Declined hearing aids.   DXA done per outside clinic 2018 with osteoporosis.   I'll defer, d/w pt.  She agrees.

## 2017-12-18 NOTE — Assessment & Plan Note (Signed)
Still on tamoxifen.  No ADE on med.  Compliant.  Has f/u with Magrinat routinely.

## 2017-12-18 NOTE — Assessment & Plan Note (Addendum)
Increase in hemoglobin has leveled off and her iron level is slightly lower.  I will discuss with hematology about potentially starting IV iron. >25 minutes spent in face to face time with patient, >50% spent in counselling or coordination of care.

## 2017-12-21 ENCOUNTER — Other Ambulatory Visit: Payer: Self-pay | Admitting: Oncology

## 2017-12-21 DIAGNOSIS — D539 Nutritional anemia, unspecified: Secondary | ICD-10-CM

## 2017-12-29 ENCOUNTER — Telehealth: Payer: Self-pay | Admitting: *Deleted

## 2017-12-29 NOTE — Telephone Encounter (Signed)
Hematology should be calling patient about this.  I routed this back to Dr. Jana Hakim in the meantime, with appreciation.  Thanks.

## 2017-12-29 NOTE — Telephone Encounter (Signed)
Patient states she received the letter today from Hematology.

## 2017-12-29 NOTE — Telephone Encounter (Signed)
Pt called in stating she was told she would start getting intervenous iron as an option but states she hasn't received a phone call or an update about anything     Cb# [id number]

## 2017-12-30 DIAGNOSIS — H5203 Hypermetropia, bilateral: Secondary | ICD-10-CM | POA: Diagnosis not present

## 2018-01-01 ENCOUNTER — Inpatient Hospital Stay: Payer: Medicare HMO | Attending: Oncology

## 2018-01-01 DIAGNOSIS — D539 Nutritional anemia, unspecified: Secondary | ICD-10-CM

## 2018-01-01 DIAGNOSIS — Z170 Estrogen receptor positive status [ER+]: Secondary | ICD-10-CM | POA: Insufficient documentation

## 2018-01-01 DIAGNOSIS — D649 Anemia, unspecified: Secondary | ICD-10-CM | POA: Diagnosis not present

## 2018-01-01 DIAGNOSIS — Z79810 Long term (current) use of selective estrogen receptor modulators (SERMs): Secondary | ICD-10-CM | POA: Insufficient documentation

## 2018-01-01 DIAGNOSIS — C50412 Malignant neoplasm of upper-outer quadrant of left female breast: Secondary | ICD-10-CM | POA: Diagnosis not present

## 2018-01-01 LAB — CBC WITH DIFFERENTIAL/PLATELET
BASOS ABS: 0 10*3/uL (ref 0.0–0.1)
BASOS PCT: 0 %
EOS ABS: 0.1 10*3/uL (ref 0.0–0.5)
Eosinophils Relative: 3 %
HCT: 31.2 % — ABNORMAL LOW (ref 34.8–46.6)
Hemoglobin: 9.9 g/dL — ABNORMAL LOW (ref 11.6–15.9)
Lymphocytes Relative: 26 %
Lymphs Abs: 1.1 10*3/uL (ref 0.9–3.3)
MCH: 30.1 pg (ref 25.1–34.0)
MCHC: 31.7 g/dL (ref 31.5–36.0)
MCV: 94.8 fL (ref 79.5–101.0)
Monocytes Absolute: 0.5 10*3/uL (ref 0.1–0.9)
Monocytes Relative: 11 %
Neutro Abs: 2.6 10*3/uL (ref 1.5–6.5)
Neutrophils Relative %: 60 %
PLATELETS: 186 10*3/uL (ref 145–400)
RBC: 3.29 MIL/uL — AB (ref 3.70–5.45)
RDW: 14.3 % (ref 11.2–14.5)
WBC: 4.4 10*3/uL (ref 3.9–10.3)

## 2018-01-01 LAB — IRON AND TIBC
Iron: 79 ug/dL (ref 41–142)
SATURATION RATIOS: 22 % (ref 21–57)
TIBC: 355 ug/dL (ref 236–444)
UIBC: 276 ug/dL

## 2018-01-01 LAB — SAVE SMEAR

## 2018-01-01 LAB — RETICULOCYTES
RBC.: 3.29 MIL/uL — ABNORMAL LOW (ref 3.70–5.45)
RETIC CT PCT: 1.9 % (ref 0.7–2.1)
Retic Count, Absolute: 62.5 10*3/uL (ref 33.7–90.7)

## 2018-01-01 LAB — FERRITIN: Ferritin: 23 ng/mL (ref 11–307)

## 2018-01-01 LAB — VITAMIN B12: VITAMIN B 12: 252 pg/mL (ref 180–914)

## 2018-01-01 LAB — FOLATE: Folate: 17.7 ng/mL (ref >5.9)

## 2018-01-08 NOTE — Progress Notes (Signed)
[place]  [phone] [phone]     ID: Kristi Carr OB: 1946/03/04  MR#: [id number]  OMV#:[id number]  Patient Care Team: [name], MD as PCP - General (Family Medicine) Paula Compton, MD as Consulting Physician (Obstetrics and Gynecology) Webb Laws, [place] (Optometry) Rolm Bookbinder, MD as Consulting Physician (General Surgery) Gery Pray, MD as Consulting Physician (Radiation Oncology) Miachel Nardelli, Virgie Dad, MD as Consulting Physician (Oncology)   CHIEF COMPLAINT: Estrogen receptor positive breast cancer  CURRENT THERAPY:  tamoxifen  BREAST CANCER HISTORY:  from the original intake note:  Kristi Carr had routine screening mammography at [facility] 07/16/2013. This suggested a mass at the 1:00 position in the left breast. Ultrasound of the left breast and axilla 07/23/2013 showed a 1.7 cm lobulated mass at the 2:00 position, which was hypoechoic. The left axilla was unremarkable.  Biopsy of the mass in question 08/02/2013 showed ([id number]) and invasive ductal (papillary) carcinoma, grade 1, estrogen receptor and progesterone receptor both 100% positive with strong staining intensity, with an MIB-1 of 9% and no HER-2 amplification, the signals ratio being 1.08 and date number per cell 1.95.  Her subsequent history is as detailed below  INTERVAL HISTORY: Kristi Carr returns today for follow-up of her estrogen receptor positive breast cancer. She continues on tamoxifen, with good tolerance. She denies issues with hot flashes or increased vaginal discharge.  She underwent diagnostic bilateral mammography with CAD and tomography on 07/11/2017 at [facility] showing: breast density category C. The grouped linear calcifications in the left axillary tail are benign. There was no evidence of malignancy. She will be due again for mammography in February 2020.   REVIEW OF SYSTEMS: Anaysia reports that she has been gardening early in the morning. She has  spending time with her 4 grandchildren. She denies unusual headaches, visual changes, nausea, vomiting, or dizziness. There has been no unusual cough, phlegm production, or pleurisy. There has been no change in bowel or bladder habits. She denies unexplained fatigue or unexplained weight loss, bleeding, rash, or fever. A detailed review of systems was otherwise stable.    PAST MEDICAL HISTORY: Past Medical History:  Diagnosis Date  . Anemia   . Breast cancer of upper-outer quadrant of left female breast ([place])   . Gallstones 08/23/03   x 2  . Hot flashes   . Hypertension   . Kidney stones   . Radiation 10/14/13-12/02/13   left breast 50.4 gray, lumpectomy cavity boosted to 63 gray  . Wears glasses     PAST SURGICAL HISTORY: Past Surgical History:  Procedure Laterality Date  . BREAST LUMPECTOMY WITH SENTINEL LYMPH NODE BIOPSY  09/06/13   LEFT BREAST SEED GUIDED LUMPECTOMY WITH LEFT AXILLARY SENTINEL NODE BIOPSY (Left)  . CHOLECYSTECTOMY    . COLONOSCOPY    . PARTIAL HYSTERECTOMY  1980   ovaries left in  . TONSILLECTOMY    . TUBAL LIGATION    . WISDOM TOOTH EXTRACTION      FAMILY HISTORY Family History  Problem Relation Age of Onset  . Diabetes Mother   . Hyperlipidemia Mother   . Hypertension Mother   . Cancer Mother        Breast   . Breast cancer Mother   . Heart attack Father   . Heart disease Father   . Hypertension Sister   . Heart disease Brother        CHF  EF 10%  . Hypertension Brother   . Gout Brother   . Depression Neg  Hx   . Alcohol abuse Neg Hx   . Drug abuse Neg Hx   . Colon cancer Neg Hx   . Uterine cancer Neg Hx   . Ovarian cancer Neg Hx   . Prostate cancer Neg Hx    the patient's father died from a myocardial infarction at age 67. The patient's mother died from intestinal blockage at age 53. The patient had one brother, one sister. The patient's mother was diagnosed with breast cancer at age 71. There is no other history of cancer in the family to her  knowledge.  GYNECOLOGIC HISTORY:  Menarche age 58, first live birth age 13, the patient is Kristi Carr. She underwent hysterectomy in 1979 but her ovaries are still in place. She did not use hormone replacement.  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 Her husband Kristi Carr used to work for [employer]. Son Kristi Carr lives in [place] where he works as a Librarian, academic for [employer]. Son Kristi Carr lives in [place] as Government social research officer for [employer] and environmental services. The patient has 4 grandchildren. She is a Psychologist, forensic     ADVANCED DIRECTIVES: Not in place   HEALTH MAINTENANCE: Social History   Tobacco Use  . Smoking status: Never Smoker  . Smokeless tobacco: Never Used  Substance Use Topics  . Alcohol use: No  . Drug use: No     Colonoscopy: 2011  PAP: Status post hysterectomy  Bone density: 07/02/2016 with a T score of -2.5 osteoporosis.  Lipid panel:  Allergies  Allergen Reactions  . Clindamycin/Lincomycin Other (See Comments)    GI upset attributed to med.  Not an allergy.     Current Outpatient Medications  Medication Sig Dispense Refill  . Biotin 1 MG CAPS Take 1 tablet by mouth daily.    . Calcium Carbonate-Vitamin D (CALTRATE 600+D PO) Take 1 tablet by mouth every other day.    . Cholecalciferol (VITAMIN D) 2000 units tablet Take 1 tablet (2,000 Units total) by mouth daily.    . cholestyramine (QUESTRAN) 4 g packet TAKE 1/2 PACKET 2 TIMES A DAY AS NEEDED(LOOSE STOOL) 60 packet 1  . clobetasol cream (TEMOVATE) 0.05 % Use daily as needed.    Marland Kitchen co-enzyme Q-10 30 MG capsule Take 100 mg by mouth daily.    . ergocalciferol (VITAMIN D2) 50000 UNITS capsule Take 50,000 Units by mouth every 30 (thirty) days.    . Ferrous Sulfate Dried (EQ SLOW-RELEASE IRON PO) Take 1 tablet by mouth 2 (two) times daily.    . fluticasone (FLONASE) 50 MCG/ACT nasal spray Place 2 sprays into both nostrils daily as needed for allergies or rhinitis.    Marland Kitchen ibuprofen (GNP IBUPROFEN)  200 MG tablet Take 200 mg by mouth as needed.      Marland Kitchen ketoconazole (NIZORAL) 2 % cream Apply 1 application topically daily. 15 g 0  . metoprolol succinate (TOPROL-XL) 50 MG 24 hr tablet Take 1 tablet (50 mg total) by mouth daily. 90 tablet 3  . multivitamin (THERAGRAN) per tablet Take 1 tablet by mouth daily.      . Omega-3 Fatty Acids (FISH OIL) 1000 MG CPDR 1 by mouth daily    . tamoxifen (NOLVADEX) 20 MG tablet Take 1 tablet (20 mg total) by mouth daily. 90 tablet 4   No current facility-administered medications for this visit.     OBJECTIVE: Middle-aged white woman who appears well  Vitals:   01/09/18 1450  BP: (!) 157/80  Pulse: 76  Resp:  18  Temp: 98.4 F (36.9 C)  SpO2: 100%     Body mass index is 22.81 kg/m.    ECOG FS:0 - Asymptomatic  Sclerae unicteric, pupils round and equal No cervical or supraclavicular adenopathy Lungs no rales or rhonchi Heart regular rate and rhythm Abd soft, nontender, positive bowel sounds MSK no focal spinal tenderness, no upper extremity lymphedema Neuro: nonfocal, well oriented, appropriate affect Breasts: The right breast is benign.  The left breast has undergone lumpectomy followed by radiation.  There is no evidence of local recurrence.  Both axillae are benign.  LAB RESULTS:  CMP     Component Value Date/Time   NA 142 12/08/2017 0857   NA 143 08/01/2016 1016   K 4.5 12/08/2017 0857   K 3.8 08/01/2016 1016   CL 106 12/08/2017 0857   CO2 31 12/08/2017 0857   CO2 29 08/01/2016 1016   GLUCOSE 96 12/08/2017 0857   GLUCOSE 77 08/01/2016 1016   BUN 16 12/08/2017 0857   BUN 13.6 08/01/2016 1016   CREATININE 0.85 12/08/2017 0857   CREATININE 0.83 07/31/2017 1227   CREATININE 0.8 08/01/2016 1016   CALCIUM 9.0 12/08/2017 0857   CALCIUM 9.5 08/01/2016 1016   PROT 6.5 07/31/2017 1227   PROT 7.0 08/01/2016 1016   ALBUMIN 3.2 (L) 07/31/2017 1227   ALBUMIN 3.6 08/01/2016 1016   AST 17 07/31/2017 1227   AST 18 08/01/2016 1016   ALT 12  07/31/2017 1227   ALT 13 08/01/2016 1016   ALKPHOS 40 07/31/2017 1227   ALKPHOS 45 08/01/2016 1016   BILITOT 0.5 07/31/2017 1227   BILITOT 0.44 08/01/2016 1016   GFRNONAA >60 07/31/2017 1227   GFRAA >60 07/31/2017 1227    I No results found for: SPEP  Lab Results  Component Value Date   WBC 4.4 01/01/2018   NEUTROABS 2.6 01/01/2018   HGB 9.9 (L) 01/01/2018   HCT 31.2 (L) 01/01/2018   MCV 94.8 01/01/2018   PLT 186 01/01/2018      Chemistry      Component Value Date/Time   NA 142 12/08/2017 0857   NA 143 08/01/2016 1016   K 4.5 12/08/2017 0857   K 3.8 08/01/2016 1016   CL 106 12/08/2017 0857   CO2 31 12/08/2017 0857   CO2 29 08/01/2016 1016   BUN 16 12/08/2017 0857   BUN 13.6 08/01/2016 1016   CREATININE 0.85 12/08/2017 0857   CREATININE 0.83 07/31/2017 1227   CREATININE 0.8 08/01/2016 1016      Component Value Date/Time   CALCIUM 9.0 12/08/2017 0857   CALCIUM 9.5 08/01/2016 1016   ALKPHOS 40 07/31/2017 1227   ALKPHOS 45 08/01/2016 1016   AST 17 07/31/2017 1227   AST 18 08/01/2016 1016   ALT 12 07/31/2017 1227   ALT 13 08/01/2016 1016   BILITOT 0.5 07/31/2017 1227   BILITOT 0.44 08/01/2016 1016       No results found for: LABCA2  No components found for: LABCA125  No results for input(s): INR in the last 168 hours.  Urinalysis No results found for: COLORURINE  STUDIES: Due for mammography at [facility] February 2020  ASSESSMENT: 72 y.o. Kristi Carr woman status post left breast biopsy 08/02/2013 for a clinical T2 N0, stage IIA invasive papillary breast cancer, grade 1, estrogen receptor and progesterone receptor both 100% positive, with an MIB-1 of 9% and no HER-2 amplification  (1) Status post left lumpectomy and sentinel lymph node sampling 09/06/2013 for a pT1c pN0, stage IA invasive  ductal carcinoma, grade 1, repeat HER-2 again negative  (2) Adjuvant radiation completed 11/30/2013  (3) started tamoxifen 12/18/2013  (4) deer tick bite February  2017--no sequela  (5) osteoporosis: Bone density at [facility] 07/02/2017 showed a T score of -2.5  (6) C. difficile colitis--completing second round of antibiotics March 2019  (7) normocytic anemia: As of 01/09/2018, ferritin, iron saturation, B12 and folate are normal as is the reticulocyte count and creatinine. REVIEW OF BLOOD FILM 01/09/2018 shows mild rouleaux, but no other abnormalities: Specifically there are no tailed poikilocytes or significant anisocytosis, no left shift in the white cell series, and no artifactual platelet counts.  PLAN: Tal is now 4-1/2 years out from definitive surgery for her breast cancer with no evidence of disease recurrence.  This is very favorable.  She is tolerating tamoxifen well and the plan will be to continue that one more year after which she will be ready to "graduate".  I do not have a simple explanation for her anemia.  She is making red cells, just not enough.  She has no obvious vitamin or iron deficiency.  She does not have renal insufficiency.  There is no hint of hemolysis and no morphologic abnormalities in the white cell or red cell series.  At some point work-up for an inflammatory condition may be worthwhile, although she is entirely asymptomatic from that point of view.   She knows to call for any other issues that may develop before the next. Zakira Ressel, Virgie Dad, MD  01/09/18 3:19 PM Medical Oncology and Hematology [facility] [address] [place], [place] [address] Tel. [phone]    Fax. [phone]  Alice Rieger, am acting as scribe for Chauncey Cruel MD.  I, Lurline Del MD, have reviewed the above documentation for accuracy and completeness, and I agree with the above.

## 2018-01-09 ENCOUNTER — Inpatient Hospital Stay: Payer: Medicare HMO | Admitting: Oncology

## 2018-01-09 VITALS — BP 157/80 | HR 76 | Temp 98.4°F | Resp 18 | Ht 64.5 in | Wt 135.0 lb

## 2018-01-09 DIAGNOSIS — Z79810 Long term (current) use of selective estrogen receptor modulators (SERMs): Secondary | ICD-10-CM

## 2018-01-09 DIAGNOSIS — Z170 Estrogen receptor positive status [ER+]: Secondary | ICD-10-CM | POA: Diagnosis not present

## 2018-01-09 DIAGNOSIS — D649 Anemia, unspecified: Secondary | ICD-10-CM

## 2018-01-09 DIAGNOSIS — C50412 Malignant neoplasm of upper-outer quadrant of left female breast: Secondary | ICD-10-CM

## 2018-01-09 MED ORDER — TAMOXIFEN CITRATE 20 MG PO TABS: 20.0000 mg | ORAL_TABLET | Freq: Every day | ORAL | 4 refills | Status: DC

## 2018-01-09 MED ORDER — KETOCONAZOLE 2 % EX CREA: 1.0000 "application " | TOPICAL_CREAM | Freq: Every day | CUTANEOUS | 0 refills | Status: DC

## 2018-01-12 ENCOUNTER — Telehealth: Payer: Self-pay | Admitting: Oncology

## 2018-01-12 NOTE — Telephone Encounter (Signed)
Per 8/26 los, called patient with appointment for one year.  Left message.  Mailed calendar.

## 2018-01-13 ENCOUNTER — Telehealth: Payer: Self-pay | Admitting: Oncology

## 2018-01-13 NOTE — Telephone Encounter (Signed)
Patient called about having too appointments scheduled for 2020. She cancelled the August

## 2018-01-22 ENCOUNTER — Other Ambulatory Visit: Payer: Self-pay | Admitting: Family Medicine

## 2018-02-23 DIAGNOSIS — R69 Illness, unspecified: Secondary | ICD-10-CM | POA: Diagnosis not present

## 2018-04-02 DIAGNOSIS — Z1389 Encounter for screening for other disorder: Secondary | ICD-10-CM | POA: Diagnosis not present

## 2018-04-02 DIAGNOSIS — Z6823 Body mass index (BMI) 23.0-23.9, adult: Secondary | ICD-10-CM | POA: Diagnosis not present

## 2018-04-02 DIAGNOSIS — Z01419 Encounter for gynecological examination (general) (routine) without abnormal findings: Secondary | ICD-10-CM | POA: Diagnosis not present

## 2018-06-22 ENCOUNTER — Telehealth: Payer: Self-pay

## 2018-06-22 NOTE — Telephone Encounter (Signed)
Returned [employer]

## 2018-06-22 NOTE — Telephone Encounter (Signed)
Returned TC to pt in regard to wanting to know appointment times in march. Let her know that her lab appointment is @11 ;00am (08/06/18) followed by her appointment with Dr. Nat Math @11 :30am (08/06/18) Pt verbalized understanding no further problems or concerns at this time.

## 2018-07-14 DIAGNOSIS — R921 Mammographic calcification found on diagnostic imaging of breast: Secondary | ICD-10-CM | POA: Diagnosis not present

## 2018-07-14 DIAGNOSIS — Z853 Personal history of malignant neoplasm of breast: Secondary | ICD-10-CM | POA: Diagnosis not present

## 2018-07-14 LAB — HM MAMMOGRAPHY

## 2018-07-28 ENCOUNTER — Encounter: Payer: Self-pay | Admitting: Family Medicine

## 2018-07-30 ENCOUNTER — Ambulatory Visit: Payer: Medicare HMO | Admitting: Oncology

## 2018-07-30 ENCOUNTER — Other Ambulatory Visit: Payer: Medicare HMO

## 2018-08-05 ENCOUNTER — Other Ambulatory Visit: Payer: Self-pay | Admitting: *Deleted

## 2018-08-05 DIAGNOSIS — Z170 Estrogen receptor positive status [ER+]: Principal | ICD-10-CM

## 2018-08-05 DIAGNOSIS — D500 Iron deficiency anemia secondary to blood loss (chronic): Secondary | ICD-10-CM

## 2018-08-05 DIAGNOSIS — C50412 Malignant neoplasm of upper-outer quadrant of left female breast: Secondary | ICD-10-CM

## 2018-08-05 NOTE — Progress Notes (Signed)
[place]  [phone] [phone]     ID: Kristi Carr OB: April 17, 1946  MR#: [id number]  EEF#:[id number]  Patient Care Team: [name], MD as PCP - General (Family Medicine) Paula Compton, MD as Consulting Physician (Obstetrics and Gynecology) Webb Laws, [place] (Optometry) Rolm Bookbinder, MD as Consulting Physician (General Surgery) Gery Pray, MD as Consulting Physician (Radiation Oncology) Magrinat, Virgie Dad, MD as Consulting Physician (Oncology)   CHIEF COMPLAINT: Estrogen receptor positive breast cancer  CURRENT THERAPY: Completing 5 years tamoxifen  BREAST CANCER HISTORY:  from the original intake note:  Destine had routine screening mammography at [facility] 07/16/2013. This suggested a mass at the 1:00 position in the left breast. Ultrasound of the left breast and axilla 07/23/2013 showed a 1.7 cm lobulated mass at the 2:00 position, which was hypoechoic. The left axilla was unremarkable.  Biopsy of the mass in question 08/02/2013 showed ([id number]) and invasive ductal (papillary) carcinoma, grade 1, estrogen receptor and progesterone receptor both 100% positive with strong staining intensity, with an MIB-1 of 9% and no HER-2 amplification, the signals ratio being 1.08 and date number per cell 1.95.  Her subsequent history is as detailed below   INTERVAL HISTORY: Kristi Carr returns today for follow-up of her estrogen receptor positive breast cancer. She continues on tamoxifen, with good tolerance. She has two weeks worth of her medication left.  Since her last visit, she underwent diagnostic bilateral mammography with CAD on 07/14/2018 at [facility] showing: breast density category C; stable post-treatment changes in the left breast; no mammographic evidence of malignancy.  She also has a longstanding normocytic anemia.  We have checked a B12 and iron studies which have been unremarkable.   REVIEW OF SYSTEMS: Kristi Carr reports she's  been reading and cleaning. She continues gardening and is getting ready to spread mulch. She enjoys spending time with two grandchildren, ages 81 and 78. She states she and her husband have been cautious of the virus and have been keeping their house clean, especially when their grandchildren visit. She reports for exercise, she's been using her treadmill when it's cold outside. The patient denies unusual headaches, visual changes, nausea, vomiting, stiff neck, dizziness, or gait imbalance. There has been no cough, phlegm production, or pleurisy, no chest pain or pressure, and no change in bowel or bladder habits. The patient denies fever, rash, bleeding, unexplained fatigue or unexplained weight loss. A detailed review of systems was otherwise entirely negative.   PAST MEDICAL HISTORY: Past Medical History:  Diagnosis Date  . Anemia   . Breast cancer of upper-outer quadrant of left female breast ([place])   . Gallstones 08/23/03   x 2  . Hot flashes   . Hypertension   . Kidney stones   . Radiation 10/14/13-12/02/13   left breast 50.4 gray, lumpectomy cavity boosted to 63 gray  . Wears glasses     PAST SURGICAL HISTORY: Past Surgical History:  Procedure Laterality Date  . BREAST LUMPECTOMY WITH SENTINEL LYMPH NODE BIOPSY  09/06/13   LEFT BREAST SEED GUIDED LUMPECTOMY WITH LEFT AXILLARY SENTINEL NODE BIOPSY (Left)  . CHOLECYSTECTOMY    . COLONOSCOPY    . PARTIAL HYSTERECTOMY  1980   ovaries left in  . TONSILLECTOMY    . TUBAL LIGATION    . WISDOM TOOTH EXTRACTION      FAMILY HISTORY Family History  Problem Relation Age of Onset  . Diabetes Mother   . Hyperlipidemia Mother   . Hypertension Mother   . Cancer  Mother        Breast   . Breast cancer Mother   . Heart attack Father   . Heart disease Father   . Hypertension Sister   . Heart disease Brother        CHF  EF 10%  . Hypertension Brother   . Gout Brother   . Depression Neg Hx   . Alcohol abuse Neg Hx   . Drug abuse Neg Hx    . Colon cancer Neg Hx   . Uterine cancer Neg Hx   . Ovarian cancer Neg Hx   . Prostate cancer Neg Hx    the patient's father died from a myocardial infarction at age 75. The patient's mother died from intestinal blockage at age 34. The patient had one brother, one sister. The patient's mother was diagnosed with breast cancer at age 25. There is no other history of cancer in the family to her knowledge.  GYNECOLOGIC HISTORY:  Menarche age 89, first live birth age 44, the patient is [place] P2. She underwent hysterectomy in 1979 but her ovaries are still in place. She did not use hormone replacement.  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 Her husband Kristi Carr used to work for [employer]. Son Kristi Carr lives in [place] where he works as a Librarian, academic for [employer]. Son Kristi Carr lives in [place] as Government social research officer for [employer] and environmental services. The patient has 4 grandchildren. She is a Psychologist, forensic     ADVANCED DIRECTIVES: Not in place   HEALTH MAINTENANCE: Social History   Tobacco Use  . Smoking status: Never Smoker  . Smokeless tobacco: Never Used  Substance Use Topics  . Alcohol use: No  . Drug use: No     Colonoscopy: 2011  PAP: Status post hysterectomy  Bone density: 07/02/2016 with a T score of -2.5 osteoporosis.  Lipid panel:  Allergies  Allergen Reactions  . Clindamycin/Lincomycin Other (See Comments)    GI upset attributed to med.  Not an allergy.     Current Outpatient Medications  Medication Sig Dispense Refill  . Biotin 1 MG CAPS Take 1 tablet by mouth daily.    . Calcium Carbonate-Vitamin D (CALTRATE 600+D PO) Take 1 tablet by mouth every other day.    . Cholecalciferol (VITAMIN D) 2000 units tablet Take 1 tablet (2,000 Units total) by mouth daily.    . cholestyramine (QUESTRAN) 4 g packet TAKE 1/2 PACKET 2 TIMES A DAY AS NEEDED(LOOSE STOOL) 60 packet 1  . clobetasol cream (TEMOVATE) 0.05 % Use daily as needed.    Marland Kitchen co-enzyme Q-10 30 MG  capsule Take 100 mg by mouth daily.    . ergocalciferol (VITAMIN D2) 50000 UNITS capsule Take 50,000 Units by mouth every 30 (thirty) days.    . Ferrous Sulfate Dried (EQ SLOW-RELEASE IRON PO) Take 1 tablet by mouth 2 (two) times daily.    . fluticasone (FLONASE) 50 MCG/ACT nasal spray Place 2 sprays into both nostrils daily as needed for allergies or rhinitis.    Marland Kitchen ibuprofen (GNP IBUPROFEN) 200 MG tablet Take 200 mg by mouth as needed.      Marland Kitchen ketoconazole (NIZORAL) 2 % cream Apply 1 application topically daily. 15 g 0  . metoprolol succinate (TOPROL-XL) 25 MG 24 hr tablet TAKE 1 TABLET BY MOUTH TWICE A DAY 180 tablet 3  . metoprolol succinate (TOPROL-XL) 50 MG 24 hr tablet Take 1 tablet (50 mg total) by mouth daily. 90 tablet 3  .  multivitamin (THERAGRAN) per tablet Take 1 tablet by mouth daily.      . Omega-3 Fatty Acids (FISH OIL) 1000 MG CPDR 1 by mouth daily    . tamoxifen (NOLVADEX) 20 MG tablet Take 1 tablet (20 mg total) by mouth daily. 90 tablet 4   No current facility-administered medications for this visit.     OBJECTIVE: Middle-aged white woman in no acute distress  Vitals:   08/06/18 1056  BP: (!) 171/81  Pulse: 71  Resp: 18  Temp: 97.8 F (36.6 C)  SpO2: 98%     Body mass index is 23.1 kg/m.    ECOG FS:0 - Asymptomatic  Sclerae unicteric, EOMs intact No cervical or supraclavicular adenopathy Lungs no rales or rhonchi Heart regular rate and rhythm Abd soft, nontender, positive bowel sounds MSK no focal spinal tenderness, no upper extremity lymphedema Neuro: nonfocal, well oriented, appropriate affect Breasts: Right breast is unremarkable.  The left breast is status post lumpectomy and radiation.  She still has some discomfort in the inframammary fold on the left side but there is no rash there today.  Both axillae are benign.  LAB RESULTS:  CMP     Component Value Date/Time   NA 144 08/06/2018 1044   NA 143 08/01/2016 1016   K 4.0 08/06/2018 1044   K 3.8  08/01/2016 1016   CL 108 08/06/2018 1044   CO2 28 08/06/2018 1044   CO2 29 08/01/2016 1016   GLUCOSE 87 08/06/2018 1044   GLUCOSE 77 08/01/2016 1016   BUN 13 08/06/2018 1044   BUN 13.6 08/01/2016 1016   CREATININE 0.83 08/06/2018 1044   CREATININE 0.8 08/01/2016 1016   CALCIUM 8.7 (L) 08/06/2018 1044   CALCIUM 9.5 08/01/2016 1016   PROT 6.6 08/06/2018 1044   PROT 7.0 08/01/2016 1016   ALBUMIN 3.3 (L) 08/06/2018 1044   ALBUMIN 3.6 08/01/2016 1016   AST 14 (L) 08/06/2018 1044   AST 18 08/01/2016 1016   ALT 10 08/06/2018 1044   ALT 13 08/01/2016 1016   ALKPHOS 40 08/06/2018 1044   ALKPHOS 45 08/01/2016 1016   BILITOT 0.4 08/06/2018 1044   BILITOT 0.44 08/01/2016 1016   GFRNONAA >60 08/06/2018 1044   GFRAA >60 08/06/2018 1044    I No results found for: SPEP  Lab Results  Component Value Date   WBC 3.4 (L) 08/06/2018   NEUTROABS PENDING 08/06/2018   HGB 9.7 (L) 08/06/2018   HCT 31.3 (L) 08/06/2018   MCV 95.7 08/06/2018   PLT 216 08/06/2018      Chemistry      Component Value Date/Time   NA 144 08/06/2018 1044   NA 143 08/01/2016 1016   K 4.0 08/06/2018 1044   K 3.8 08/01/2016 1016   CL 108 08/06/2018 1044   CO2 28 08/06/2018 1044   CO2 29 08/01/2016 1016   BUN 13 08/06/2018 1044   BUN 13.6 08/01/2016 1016   CREATININE 0.83 08/06/2018 1044   CREATININE 0.8 08/01/2016 1016      Component Value Date/Time   CALCIUM 8.7 (L) 08/06/2018 1044   CALCIUM 9.5 08/01/2016 1016   ALKPHOS 40 08/06/2018 1044   ALKPHOS 45 08/01/2016 1016   AST 14 (L) 08/06/2018 1044   AST 18 08/01/2016 1016   ALT 10 08/06/2018 1044   ALT 13 08/01/2016 1016   BILITOT 0.4 08/06/2018 1044   BILITOT 0.44 08/01/2016 1016       No results found for: LABCA2  No components found for: [id number]  No results for input(s): INR in the last 168 hours.  Urinalysis No results found for: COLORURINE  STUDIES: Next mammogram will be due February 2021 at [facility]: 73 y.o. Fernand Parkins  woman status post left breast biopsy 08/02/2013 for a clinical T2 N0, stage IIA invasive papillary breast cancer, grade 1, estrogen receptor and progesterone receptor both 100% positive, with an MIB-1 of 9% and no HER-2 amplification  (1) Status post left lumpectomy and sentinel lymph node sampling 09/06/2013 for a pT1c pN0, stage IA invasive ductal carcinoma, grade 1, repeat HER-2 again negative  (2) Adjuvant radiation completed 11/30/2013  (3) started tamoxifen 12/18/2013, completing 5 years April 2020  (4) deer tick bite February 2017--no sequela  (5) osteoporosis: [facility] density at [facility] 07/02/2017 showed a T score of -2.5  (6) C. difficile colitis--completing second round of antibiotics March 2019  (7) normocytic anemia: As of 01/09/2018, ferritin, iron saturation, B12 and folate are normal as is the reticulocyte count and creatinine. REVIEW OF BLOOD FILM 01/09/2018 shows mild rouleaux, but no other abnormalities: Specifically there are no tailed poikilocytes or significant anisocytosis, no left shift in the white cell series, and no artifactual platelet counts.  PLAN: Tamieka is now 5 years out from definitive surgery for her breast cancer with no evidence of disease recurrence.  This is very favorable.  She is just about 5 years into tamoxifen and is comfortable stopping at this point.  So on my.  She has done very well with this medication and she has obtained the main benefit from it which means a 50% reduction in her risk of breast cancer.  She does have a moderate anemia, which is stable, and she continues to have a normal reticulocyte count.  We did send an ESR and ANA today, which I expect will be normal.  Otherwise I will contact her  At this point I feel comfortable releasing her to her primary care physician.  All she will need in terms of breast cancer follow-up is yearly mammography and a yearly physician breast exam.  I will be glad to see Kristi Carr again at any point in the  future if and when the need arises but as of now are making no further routine appointments for her here.    Magrinat, Virgie Dad, MD  08/06/18 11:29 AM Medical Oncology and Hematology [facility] [address] [place], [place] [address] Tel. [phone]    Fax. [phone]   I, Wilburn Mylar, am acting as scribe for Dr. Virgie Dad. Magrinat.  I, Lurline Del MD, have reviewed the above documentation for accuracy and completeness, and I agree with the above.

## 2018-08-06 ENCOUNTER — Inpatient Hospital Stay: Payer: Medicare HMO | Attending: Oncology

## 2018-08-06 ENCOUNTER — Other Ambulatory Visit: Payer: Self-pay

## 2018-08-06 ENCOUNTER — Inpatient Hospital Stay: Payer: Medicare HMO | Admitting: Oncology

## 2018-08-06 VITALS — BP 171/81 | HR 71 | Temp 97.8°F | Resp 18 | Wt 136.7 lb

## 2018-08-06 DIAGNOSIS — D649 Anemia, unspecified: Secondary | ICD-10-CM | POA: Diagnosis not present

## 2018-08-06 DIAGNOSIS — C50412 Malignant neoplasm of upper-outer quadrant of left female breast: Secondary | ICD-10-CM | POA: Diagnosis not present

## 2018-08-06 DIAGNOSIS — Z170 Estrogen receptor positive status [ER+]: Secondary | ICD-10-CM | POA: Insufficient documentation

## 2018-08-06 DIAGNOSIS — I10 Essential (primary) hypertension: Secondary | ICD-10-CM | POA: Diagnosis not present

## 2018-08-06 DIAGNOSIS — Z79810 Long term (current) use of selective estrogen receptor modulators (SERMs): Secondary | ICD-10-CM | POA: Diagnosis not present

## 2018-08-06 DIAGNOSIS — D500 Iron deficiency anemia secondary to blood loss (chronic): Secondary | ICD-10-CM

## 2018-08-06 LAB — CBC WITH DIFFERENTIAL (CANCER CENTER ONLY)
Abs Immature Granulocytes: 0.01 10*3/uL (ref 0.00–0.07)
Basophils Absolute: 0 10*3/uL (ref 0.0–0.1)
Basophils Relative: 0 %
Eosinophils Absolute: 0.2 10*3/uL (ref 0.0–0.5)
Eosinophils Relative: 5 %
HCT: 31.3 % — ABNORMAL LOW (ref 36.0–46.0)
Hemoglobin: 9.7 g/dL — ABNORMAL LOW (ref 12.0–15.0)
Immature Granulocytes: 0 %
Lymphocytes Relative: 24 %
Lymphs Abs: 0.8 10*3/uL (ref 0.7–4.0)
MCH: 29.7 pg (ref 26.0–34.0)
MCHC: 31 g/dL (ref 30.0–36.0)
MCV: 95.7 fL (ref 80.0–100.0)
Monocytes Absolute: 0.4 10*3/uL (ref 0.1–1.0)
Monocytes Relative: 13 %
Neutro Abs: 2 10*3/uL (ref 1.7–7.7)
Neutrophils Relative %: 58 %
Platelet Count: 216 10*3/uL (ref 150–400)
RBC: 3.27 MIL/uL — ABNORMAL LOW (ref 3.87–5.11)
RDW: 13.4 % (ref 11.5–15.5)
WBC Count: 3.4 10*3/uL — ABNORMAL LOW (ref 4.0–10.5)
nRBC: 0 % (ref 0.0–0.2)

## 2018-08-06 LAB — RETICULOCYTES
Immature Retic Fract: 14 % (ref 2.3–15.9)
RBC.: 3.27 MIL/uL — ABNORMAL LOW (ref 3.87–5.11)
Retic Count, Absolute: 54.9 10*3/uL (ref 19.0–186.0)
Retic Ct Pct: 1.7 % (ref 0.4–3.1)

## 2018-08-06 LAB — CMP (CANCER CENTER ONLY)
ALT: 10 U/L (ref 0–44)
AST: 14 U/L — AB (ref 15–41)
Albumin: 3.3 g/dL — ABNORMAL LOW (ref 3.5–5.0)
Alkaline Phosphatase: 40 U/L (ref 38–126)
Anion gap: 8 (ref 5–15)
BUN: 13 mg/dL (ref 8–23)
CO2: 28 mmol/L (ref 22–32)
Calcium: 8.7 mg/dL — ABNORMAL LOW (ref 8.9–10.3)
Chloride: 108 mmol/L (ref 98–111)
Creatinine: 0.83 mg/dL (ref 0.44–1.00)
GFR, Est AFR Am: >60 mL/min (ref >60)
GFR, Estimated: >60 mL/min (ref >60)
GLUCOSE: 87 mg/dL (ref 70–99)
POTASSIUM: 4 mmol/L (ref 3.5–5.1)
Sodium: 144 mmol/L (ref 135–145)
Total Bilirubin: 0.4 mg/dL (ref 0.3–1.2)
Total Protein: 6.6 g/dL (ref 6.5–8.1)

## 2018-08-06 LAB — SEDIMENTATION RATE: Sed Rate: 12 mm/hr (ref 0–22)

## 2018-08-06 LAB — SAVE SMEAR(SSMR), FOR PROVIDER SLIDE REVIEW

## 2018-08-06 LAB — FERRITIN: Ferritin: 19 ng/mL (ref 11–307)

## 2018-08-07 LAB — FANA STAINING PATTERNS

## 2018-08-07 LAB — ANTINUCLEAR ANTIBODIES, IFA: ANA Ab, IFA: POSITIVE — AB

## 2018-08-10 ENCOUNTER — Other Ambulatory Visit: Payer: Self-pay | Admitting: Oncology

## 2018-08-10 ENCOUNTER — Encounter: Payer: Self-pay | Admitting: Oncology

## 2018-12-15 ENCOUNTER — Other Ambulatory Visit: Payer: Self-pay | Admitting: Family Medicine

## 2018-12-15 ENCOUNTER — Ambulatory Visit: Payer: Medicare HMO

## 2018-12-15 ENCOUNTER — Other Ambulatory Visit (INDEPENDENT_AMBULATORY_CARE_PROVIDER_SITE_OTHER): Payer: Medicare HMO

## 2018-12-15 DIAGNOSIS — D509 Iron deficiency anemia, unspecified: Secondary | ICD-10-CM

## 2018-12-15 DIAGNOSIS — E559 Vitamin D deficiency, unspecified: Secondary | ICD-10-CM

## 2018-12-15 DIAGNOSIS — I10 Essential (primary) hypertension: Secondary | ICD-10-CM

## 2018-12-15 LAB — COMPREHENSIVE METABOLIC PANEL
ALT: 21 U/L (ref 0–35)
AST: 21 U/L (ref 0–37)
Albumin: 3.8 g/dL (ref 3.5–5.2)
Alkaline Phosphatase: 48 U/L (ref 39–117)
BUN: 16 mg/dL (ref 6–23)
CO2: 31 mEq/L (ref 19–32)
Calcium: 9 mg/dL (ref 8.4–10.5)
Chloride: 106 mEq/L (ref 96–112)
Creatinine, Ser: 0.8 mg/dL (ref 0.40–1.20)
GFR: 70.47 mL/min (ref >60.00)
Glucose, Bld: 88 mg/dL (ref 70–99)
Potassium: 4.2 mEq/L (ref 3.5–5.1)
Sodium: 143 mEq/L (ref 135–145)
Total Bilirubin: 0.6 mg/dL (ref 0.2–1.2)
Total Protein: 6.2 g/dL (ref 6.0–8.3)

## 2018-12-15 LAB — LIPID PANEL
Cholesterol: 169 mg/dL (ref 0–200)
HDL: 46.8 mg/dL (ref >39.00)
LDL Cholesterol: 102 mg/dL — ABNORMAL HIGH (ref 0–99)
NonHDL: 121.94
Total CHOL/HDL Ratio: 4
Triglycerides: 100 mg/dL (ref 0.0–149.0)
VLDL: 20 mg/dL (ref 0.0–40.0)

## 2018-12-15 LAB — CBC WITH DIFFERENTIAL/PLATELET
Basophils Absolute: 0 10*3/uL (ref 0.0–0.1)
Basophils Relative: 0.3 % (ref 0.0–3.0)
Eosinophils Absolute: 0.3 10*3/uL (ref 0.0–0.7)
Eosinophils Relative: 5.6 % — ABNORMAL HIGH (ref 0.0–5.0)
HCT: 30.1 % — ABNORMAL LOW (ref 36.0–46.0)
Hemoglobin: 9.8 g/dL — ABNORMAL LOW (ref 12.0–15.0)
Lymphocytes Relative: 22.5 % (ref 12.0–46.0)
Lymphs Abs: 1 10*3/uL (ref 0.7–4.0)
MCHC: 32.4 g/dL (ref 30.0–36.0)
MCV: 92.6 fl (ref 78.0–100.0)
Monocytes Absolute: 0.6 10*3/uL (ref 0.1–1.0)
Monocytes Relative: 13.5 % — ABNORMAL HIGH (ref 3.0–12.0)
Neutro Abs: 2.7 10*3/uL (ref 1.4–7.7)
Neutrophils Relative %: 58.1 % (ref 43.0–77.0)
Platelets: 268 10*3/uL (ref 150.0–400.0)
RBC: 3.25 Mil/uL — ABNORMAL LOW (ref 3.87–5.11)
RDW: 14.4 % (ref 11.5–15.5)
WBC: 4.6 10*3/uL (ref 4.0–10.5)

## 2018-12-15 LAB — FERRITIN: Ferritin: 16.2 ng/mL (ref 10.0–291.0)

## 2018-12-15 LAB — IRON: Iron: 31 ug/dL — ABNORMAL LOW (ref 42–145)

## 2018-12-15 LAB — VITAMIN D 25 HYDROXY (VIT D DEFICIENCY, FRACTURES): VITD: 39.71 ng/mL (ref 30.00–100.00)

## 2018-12-18 ENCOUNTER — Ambulatory Visit (INDEPENDENT_AMBULATORY_CARE_PROVIDER_SITE_OTHER): Payer: Medicare HMO | Admitting: Family Medicine

## 2018-12-18 ENCOUNTER — Other Ambulatory Visit: Payer: Self-pay

## 2018-12-18 ENCOUNTER — Encounter: Payer: Self-pay | Admitting: Family Medicine

## 2018-12-18 VITALS — BP 148/96 | HR 71 | Temp 98.0°F | Ht 64.25 in | Wt 136.4 lb

## 2018-12-18 DIAGNOSIS — D500 Iron deficiency anemia secondary to blood loss (chronic): Secondary | ICD-10-CM | POA: Diagnosis not present

## 2018-12-18 DIAGNOSIS — Z7189 Other specified counseling: Secondary | ICD-10-CM

## 2018-12-18 DIAGNOSIS — E559 Vitamin D deficiency, unspecified: Secondary | ICD-10-CM | POA: Diagnosis not present

## 2018-12-18 DIAGNOSIS — Z9049 Acquired absence of other specified parts of digestive tract: Secondary | ICD-10-CM

## 2018-12-18 DIAGNOSIS — I10 Essential (primary) hypertension: Secondary | ICD-10-CM | POA: Diagnosis not present

## 2018-12-18 DIAGNOSIS — Z0000 Encounter for general adult medical examination without abnormal findings: Secondary | ICD-10-CM | POA: Diagnosis not present

## 2018-12-18 MED ORDER — CHOLESTYRAMINE 4 G PO PACK: PACK | ORAL | 12 refills | Status: DC

## 2018-12-18 NOTE — Patient Instructions (Addendum)
Check with your insurance to see if they will cover the shingrix shot. Check with GI about a possible follow up colonoscopy.  I would get a flu shot each fall.   Let me know if you want to go to rheumatology.   Take care.  Glad to see you.   Plan on recheck here in 6 months about your blood counts/iron. Keep taking iron and vitamin D.

## 2018-12-18 NOTE — Progress Notes (Signed)
I have personally reviewed the Medicare Annual Wellness questionnaire and have noted 1. The patient's medical and social history 2. Their use of alcohol, tobacco or illicit drugs 3. Their current medications and supplements 4. The patient's functional ability including ADL's, fall risks, home safety risks and hearing or visual             impairment. 5. Diet and physical activities 6. Evidence for depression or mood disorders  The patients weight, height, BMI have been recorded in the chart and visual acuity is per eye clinic.  I have made referrals, counseling and provided education to the patient based review of the above and I have provided the pt with a written personalized care plan for preventive services.  Provider list updated- see scanned forms.  Routine anticipatory guidance given to patient.  See health maintenance. The possibility exists that previously documented standard health maintenance information may have been brought forward from a previous encounter into this note.  If needed, that same information has been updated to reflect the current situation based on today's encounter.    Flu yearly. Shingles 2010.  Discussed with patient about coverage. PNA up-to-date Tetanus 2012. Colon cancer screening done with colonoscopy 2015.  Follow-up pending but discussed with patient about pandemic considerations.  She will consider. Breast cancer screening 2020 Bone density test 2018 per patient report.  Defer at this point given pandemic considerations. Advance directive husband and both children all equally designated if patient were incapacitated. Cognitive function addressed- see scanned forms- and if abnormal then additional documentation follows.   History of vitamin D deficiency noted.  Labs discussed with patient.  Level improved.  Hypertension:    Using medication without problems or lightheadedness:  yes Chest pain with exertion:no Edema:no Short of breath:no Average  home BPs:120-130s/70-80s at home.   Still using questran at baseline.  Use prn.  No ADE on med.    She is helping care for his husband who has some memory changes and he had f/u pending.  She hasn't noted acute changes in him in the meantime. Some of his days are better than others.    Discussed with patient about previous positive antinuclear antibody, which might possibly explain her longstanding anemia. She has been offered rheumatology evaluation previously.  I encouraged this again at the office visit today. D/w pt about the above and she'll consider going to rheum.  Long standing anemia noted.  She had a raynaud's phenom occ noted in the hands.   Unclear if this is related to the above regarding her antinuclear antibody.  PMH and SH reviewed  Meds, vitals, and allergies reviewed.   ROS: Per HPI.  Unless specifically indicated otherwise in HPI, the patient denies:  General: fever. Eyes: acute vision changes ENT: sore throat Cardiovascular: chest pain Respiratory: SOB GI: vomiting GU: dysuria Musculoskeletal: acute back pain Derm: acute rash Neuro: acute motor dysfunction Psych: worsening mood Endocrine: polydipsia Heme: bleeding Allergy: hayfever  GEN: nad, alert and oriented HEENT: ncat NECK: supple w/o LA CV: rrr. PULM: ctab, no inc wob ABD: soft, +bs EXT: no edema SKIN: no acute rash  Health Maintenance  Topic Date Due  . INFLUENZA VACCINE  12/19/2018  . COLONOSCOPY  01/28/2019  . MAMMOGRAM  07/14/2020  . TETANUS/TDAP  12/04/2020  . DEXA SCAN  Completed  . Hepatitis C Screening  Completed  . PNA vac Low Risk Adult  Completed   Labs discussed with patient at office visit.

## 2018-12-22 DIAGNOSIS — Z9049 Acquired absence of other specified parts of digestive tract: Secondary | ICD-10-CM | POA: Insufficient documentation

## 2018-12-22 NOTE — Assessment & Plan Note (Signed)
Flu yearly. Shingles 2010.  Discussed with patient about coverage. PNA up-to-date Tetanus 2012. Colon cancer screening done with colonoscopy 2015.  Follow-up pending but discussed with patient about pandemic considerations.  She will consider. Breast cancer screening 2020 Advance directive husband and both children all equally designated if patient were incapacitated. Cognitive function addressed- see scanned forms- and if abnormal then additional documentation follows.

## 2018-12-22 NOTE — Assessment & Plan Note (Signed)
Improved.  Labs discussed with patient.  Continue as is.

## 2018-12-22 NOTE — Assessment & Plan Note (Signed)
She likely has a whitecoat component.  Blood pressure is clearly improved on home check.  Continue as is.  She agrees.  See above.

## 2018-12-22 NOTE — Assessment & Plan Note (Signed)
Advance directive husband and both children all equally designated if patient were incapacitated.

## 2018-12-22 NOTE — Assessment & Plan Note (Signed)
Discussed with patient about previous positive antinuclear antibody, which might possibly explain her longstanding anemia. She has been offered rheumatology evaluation previously.  I encouraged this again at the office visit today. D/w pt about the above and she'll consider going to rheum.  Long standing anemia noted.  She had a raynaud's phenom occ noted in the hands.   Unclear if this is related to the above regarding her antinuclear antibody.

## 2018-12-22 NOTE — Assessment & Plan Note (Signed)
Still using questran at baseline.  Use prn.  No ADE on med.

## 2018-12-23 ENCOUNTER — Telehealth: Payer: Self-pay | Admitting: Family Medicine

## 2018-12-23 DIAGNOSIS — R768 Other specified abnormal immunological findings in serum: Secondary | ICD-10-CM

## 2018-12-23 NOTE — Telephone Encounter (Signed)
Patient said Dr.Duncan asked her to call when she was ready to be referred to a Rheumatologist.  Patient's ready.  Patient would like to go to [place].  Patient hasn't been to a Rheumatologist before.  Patient can go anytime.

## 2018-12-23 NOTE — Telephone Encounter (Signed)
I put in the referral.  Thanks.  

## 2018-12-24 NOTE — Telephone Encounter (Signed)
Patient advised.

## 2019-01-05 DIAGNOSIS — M1990 Unspecified osteoarthritis, unspecified site: Secondary | ICD-10-CM | POA: Diagnosis not present

## 2019-01-05 DIAGNOSIS — I7300 Raynaud's syndrome without gangrene: Secondary | ICD-10-CM | POA: Diagnosis not present

## 2019-01-05 DIAGNOSIS — R768 Other specified abnormal immunological findings in serum: Secondary | ICD-10-CM | POA: Diagnosis not present

## 2019-01-05 DIAGNOSIS — M359 Systemic involvement of connective tissue, unspecified: Secondary | ICD-10-CM | POA: Diagnosis not present

## 2019-01-05 DIAGNOSIS — R21 Rash and other nonspecific skin eruption: Secondary | ICD-10-CM | POA: Diagnosis not present

## 2019-01-14 ENCOUNTER — Other Ambulatory Visit: Payer: Medicare HMO

## 2019-01-14 ENCOUNTER — Ambulatory Visit: Payer: Medicare HMO | Admitting: Oncology

## 2019-01-20 DIAGNOSIS — M1990 Unspecified osteoarthritis, unspecified site: Secondary | ICD-10-CM | POA: Diagnosis not present

## 2019-01-20 DIAGNOSIS — I7300 Raynaud's syndrome without gangrene: Secondary | ICD-10-CM | POA: Diagnosis not present

## 2019-01-20 DIAGNOSIS — R768 Other specified abnormal immunological findings in serum: Secondary | ICD-10-CM | POA: Diagnosis not present

## 2019-01-20 DIAGNOSIS — H5203 Hypermetropia, bilateral: Secondary | ICD-10-CM | POA: Diagnosis not present

## 2019-01-20 DIAGNOSIS — M341 CR(E)ST syndrome: Secondary | ICD-10-CM | POA: Diagnosis not present

## 2019-01-20 DIAGNOSIS — M359 Systemic involvement of connective tissue, unspecified: Secondary | ICD-10-CM | POA: Diagnosis not present

## 2019-01-26 ENCOUNTER — Other Ambulatory Visit: Payer: Self-pay | Admitting: Family Medicine

## 2019-02-08 DIAGNOSIS — R69 Illness, unspecified: Secondary | ICD-10-CM | POA: Diagnosis not present

## 2019-04-04 DIAGNOSIS — K5790 Diverticulosis of intestine, part unspecified, without perforation or abscess without bleeding: Secondary | ICD-10-CM | POA: Insufficient documentation

## 2019-04-04 DIAGNOSIS — K648 Other hemorrhoids: Secondary | ICD-10-CM | POA: Insufficient documentation

## 2019-04-05 DIAGNOSIS — Z86010 Personal history of colonic polyps: Secondary | ICD-10-CM | POA: Diagnosis not present

## 2019-04-05 DIAGNOSIS — A0471 Enterocolitis due to Clostridium difficile, recurrent: Secondary | ICD-10-CM | POA: Diagnosis not present

## 2019-04-05 DIAGNOSIS — Z1211 Encounter for screening for malignant neoplasm of colon: Secondary | ICD-10-CM | POA: Diagnosis not present

## 2019-04-05 DIAGNOSIS — K648 Other hemorrhoids: Secondary | ICD-10-CM | POA: Diagnosis not present

## 2019-04-05 DIAGNOSIS — K5730 Diverticulosis of large intestine without perforation or abscess without bleeding: Secondary | ICD-10-CM | POA: Diagnosis not present

## 2019-04-05 DIAGNOSIS — R197 Diarrhea, unspecified: Secondary | ICD-10-CM | POA: Diagnosis not present

## 2019-04-05 LAB — HM COLONOSCOPY

## 2019-04-21 DIAGNOSIS — M1990 Unspecified osteoarthritis, unspecified site: Secondary | ICD-10-CM | POA: Diagnosis not present

## 2019-04-21 DIAGNOSIS — D8989 Other specified disorders involving the immune mechanism, not elsewhere classified: Secondary | ICD-10-CM | POA: Diagnosis not present

## 2019-04-21 DIAGNOSIS — M341 CR(E)ST syndrome: Secondary | ICD-10-CM | POA: Diagnosis not present

## 2019-04-21 DIAGNOSIS — R768 Other specified abnormal immunological findings in serum: Secondary | ICD-10-CM | POA: Diagnosis not present

## 2019-04-21 DIAGNOSIS — I7300 Raynaud's syndrome without gangrene: Secondary | ICD-10-CM | POA: Diagnosis not present

## 2019-04-21 DIAGNOSIS — M359 Systemic involvement of connective tissue, unspecified: Secondary | ICD-10-CM | POA: Diagnosis not present

## 2019-05-06 ENCOUNTER — Encounter: Payer: Self-pay | Admitting: Gastroenterology

## 2019-06-10 DIAGNOSIS — Z01419 Encounter for gynecological examination (general) (routine) without abnormal findings: Secondary | ICD-10-CM | POA: Diagnosis not present

## 2019-06-10 DIAGNOSIS — Z1389 Encounter for screening for other disorder: Secondary | ICD-10-CM | POA: Diagnosis not present

## 2019-06-22 DIAGNOSIS — M359 Systemic involvement of connective tissue, unspecified: Secondary | ICD-10-CM | POA: Diagnosis not present

## 2019-06-22 DIAGNOSIS — I7300 Raynaud's syndrome without gangrene: Secondary | ICD-10-CM | POA: Diagnosis not present

## 2019-06-22 DIAGNOSIS — M341 CR(E)ST syndrome: Secondary | ICD-10-CM | POA: Diagnosis not present

## 2019-06-22 DIAGNOSIS — R768 Other specified abnormal immunological findings in serum: Secondary | ICD-10-CM | POA: Diagnosis not present

## 2019-06-22 DIAGNOSIS — M1990 Unspecified osteoarthritis, unspecified site: Secondary | ICD-10-CM | POA: Diagnosis not present

## 2019-07-19 DIAGNOSIS — Z1231 Encounter for screening mammogram for malignant neoplasm of breast: Secondary | ICD-10-CM | POA: Diagnosis not present

## 2019-07-19 LAB — HM MAMMOGRAPHY

## 2019-07-25 ENCOUNTER — Other Ambulatory Visit: Payer: Self-pay | Admitting: Family Medicine

## 2019-07-25 DIAGNOSIS — D509 Iron deficiency anemia, unspecified: Secondary | ICD-10-CM

## 2019-07-25 DIAGNOSIS — E559 Vitamin D deficiency, unspecified: Secondary | ICD-10-CM

## 2019-07-25 DIAGNOSIS — I10 Essential (primary) hypertension: Secondary | ICD-10-CM

## 2019-07-26 ENCOUNTER — Other Ambulatory Visit (INDEPENDENT_AMBULATORY_CARE_PROVIDER_SITE_OTHER): Payer: Medicare HMO

## 2019-07-26 ENCOUNTER — Telehealth: Payer: Self-pay | Admitting: Radiology

## 2019-07-26 ENCOUNTER — Other Ambulatory Visit: Payer: Self-pay

## 2019-07-26 DIAGNOSIS — D509 Iron deficiency anemia, unspecified: Secondary | ICD-10-CM | POA: Diagnosis not present

## 2019-07-26 DIAGNOSIS — I10 Essential (primary) hypertension: Secondary | ICD-10-CM | POA: Diagnosis not present

## 2019-07-26 DIAGNOSIS — E559 Vitamin D deficiency, unspecified: Secondary | ICD-10-CM

## 2019-07-26 LAB — CBC WITH DIFFERENTIAL/PLATELET
Basophils Absolute: 0 10*3/uL (ref 0.0–0.1)
Basophils Relative: 0.8 % (ref 0.0–3.0)
Eosinophils Absolute: 0.2 10*3/uL (ref 0.0–0.7)
Eosinophils Relative: 5.1 % — ABNORMAL HIGH (ref 0.0–5.0)
HCT: 21.5 % — CL (ref 36.0–46.0)
Hemoglobin: 6.4 g/dL — CL (ref 12.0–15.0)
Lymphocytes Relative: 20.9 % (ref 12.0–46.0)
Lymphs Abs: 0.8 10*3/uL (ref 0.7–4.0)
MCHC: 29.7 g/dL — ABNORMAL LOW (ref 30.0–36.0)
MCV: 66.5 fl — ABNORMAL LOW (ref 78.0–100.0)
Monocytes Absolute: 0.7 10*3/uL (ref 0.1–1.0)
Monocytes Relative: 18.1 % — ABNORMAL HIGH (ref 3.0–12.0)
Neutro Abs: 2 10*3/uL (ref 1.4–7.7)
Neutrophils Relative %: 55.1 % (ref 43.0–77.0)
Platelets: 401 10*3/uL — ABNORMAL HIGH (ref 150.0–400.0)
RBC: 3.23 Mil/uL — ABNORMAL LOW (ref 3.87–5.11)
RDW: 17.7 % — ABNORMAL HIGH (ref 11.5–15.5)
WBC: 3.6 10*3/uL — ABNORMAL LOW (ref 4.0–10.5)

## 2019-07-26 LAB — LIPID PANEL
Cholesterol: 144 mg/dL (ref 0–200)
HDL: 40.7 mg/dL (ref >39.00)
LDL Cholesterol: 85 mg/dL (ref 0–99)
NonHDL: 103.2
Total CHOL/HDL Ratio: 4
Triglycerides: 89 mg/dL (ref 0.0–149.0)
VLDL: 17.8 mg/dL (ref 0.0–40.0)

## 2019-07-26 LAB — VITAMIN D 25 HYDROXY (VIT D DEFICIENCY, FRACTURES): VITD: 43.45 ng/mL (ref 30.00–100.00)

## 2019-07-26 LAB — COMPREHENSIVE METABOLIC PANEL
ALT: 10 U/L (ref 0–35)
AST: 16 U/L (ref 0–37)
Albumin: 3.7 g/dL (ref 3.5–5.2)
Alkaline Phosphatase: 53 U/L (ref 39–117)
BUN: 15 mg/dL (ref 6–23)
CO2: 30 mEq/L (ref 19–32)
Calcium: 8.8 mg/dL (ref 8.4–10.5)
Chloride: 105 mEq/L (ref 96–112)
Creatinine, Ser: 0.7 mg/dL (ref 0.40–1.20)
GFR: 82.06 mL/min (ref >60.00)
Glucose, Bld: 95 mg/dL (ref 70–99)
Potassium: 3.6 mEq/L (ref 3.5–5.1)
Sodium: 141 mEq/L (ref 135–145)
Total Bilirubin: 0.5 mg/dL (ref 0.2–1.2)
Total Protein: 6.7 g/dL (ref 6.0–8.3)

## 2019-07-26 LAB — FERRITIN: Ferritin: 3.5 ng/mL — ABNORMAL LOW (ref 10.0–291.0)

## 2019-07-26 LAB — IRON: Iron: 28 ug/dL — ABNORMAL LOW (ref 42–145)

## 2019-07-26 NOTE — Telephone Encounter (Signed)
[facility] called critical results, HGB - 6.4, HCT - 21.5. Results given to Dr Damita Dunnings

## 2019-07-26 NOTE — Telephone Encounter (Signed)
Thanks for checking on patient.  Agree with ER, I'll defer to patient on this.

## 2019-07-26 NOTE — Telephone Encounter (Signed)
Spoke with patient. Patient has had weak spells off and on, for example with standing up and getting weak for a few minutes. Otherwise states she is fine no issues. I advised patient of Dr Josefine Class comments. Patient has appointments today that she needs to attend and asked if she can go to ER tomorrow. I consulted with Dr Damita Dunnings and with patient having weak symptoms even if it off and on recommendations are to go now/today. Patient is aware of everything. Patient still decided to go tomorrow morning. Advised patient I would document our conversation and if anything changes to let us know.

## 2019-07-26 NOTE — Telephone Encounter (Signed)
Call pt.  HGB low.   Check with her about sx and advise her to go to ER.  Thanks.

## 2019-07-27 ENCOUNTER — Encounter (HOSPITAL_COMMUNITY): Payer: Self-pay | Admitting: Emergency Medicine

## 2019-07-27 ENCOUNTER — Emergency Department (HOSPITAL_COMMUNITY)
Admission: EM | Admit: 2019-07-27 | Discharge: 2019-07-27 | Disposition: A | Discharge status: Home / Self Care | Payer: Medicare HMO | Attending: Emergency Medicine | Admitting: Emergency Medicine

## 2019-07-27 ENCOUNTER — Other Ambulatory Visit: Payer: Self-pay

## 2019-07-27 DIAGNOSIS — Z79899 Other long term (current) drug therapy: Secondary | ICD-10-CM | POA: Insufficient documentation

## 2019-07-27 DIAGNOSIS — Z853 Personal history of malignant neoplasm of breast: Secondary | ICD-10-CM | POA: Diagnosis not present

## 2019-07-27 DIAGNOSIS — D649 Anemia, unspecified: Secondary | ICD-10-CM | POA: Diagnosis not present

## 2019-07-27 DIAGNOSIS — D509 Iron deficiency anemia, unspecified: Secondary | ICD-10-CM | POA: Diagnosis not present

## 2019-07-27 DIAGNOSIS — R718 Other abnormality of red blood cells: Secondary | ICD-10-CM | POA: Diagnosis present

## 2019-07-27 DIAGNOSIS — I10 Essential (primary) hypertension: Secondary | ICD-10-CM | POA: Diagnosis not present

## 2019-07-27 DIAGNOSIS — Z5189 Encounter for other specified aftercare: Secondary | ICD-10-CM | POA: Diagnosis not present

## 2019-07-27 LAB — CBC WITH DIFFERENTIAL/PLATELET
Abs Immature Granulocytes: 0.01 10*3/uL (ref 0.00–0.07)
Basophils Absolute: 0 10*3/uL (ref 0.0–0.1)
Basophils Relative: 0 %
Eosinophils Absolute: 0.2 10*3/uL (ref 0.0–0.5)
Eosinophils Relative: 6 %
HCT: 23.1 % — ABNORMAL LOW (ref 36.0–46.0)
Hemoglobin: 6.1 g/dL — CL (ref 12.0–15.0)
Immature Granulocytes: 0 %
Lymphocytes Relative: 23 %
Lymphs Abs: 0.6 10*3/uL — ABNORMAL LOW (ref 0.7–4.0)
MCH: 18.9 pg — ABNORMAL LOW (ref 26.0–34.0)
MCHC: 26.4 g/dL — ABNORMAL LOW (ref 30.0–36.0)
MCV: 71.7 fL — ABNORMAL LOW (ref 80.0–100.0)
Monocytes Absolute: 0.6 10*3/uL (ref 0.1–1.0)
Monocytes Relative: 21 %
Neutro Abs: 1.4 10*3/uL — ABNORMAL LOW (ref 1.7–7.7)
Neutrophils Relative %: 50 %
Platelets: 384 10*3/uL (ref 150–400)
RBC: 3.22 MIL/uL — ABNORMAL LOW (ref 3.87–5.11)
RDW: 17.3 % — ABNORMAL HIGH (ref 11.5–15.5)
WBC: 2.8 10*3/uL — ABNORMAL LOW (ref 4.0–10.5)
nRBC: 0 % (ref 0.0–0.2)

## 2019-07-27 LAB — COMPREHENSIVE METABOLIC PANEL
ALT: 14 U/L (ref 0–44)
AST: 18 U/L (ref 15–41)
Albumin: 3.5 g/dL (ref 3.5–5.0)
Alkaline Phosphatase: 49 U/L (ref 38–126)
Anion gap: 11 (ref 5–15)
BUN: 11 mg/dL (ref 8–23)
CO2: 26 mmol/L (ref 22–32)
Calcium: 8.9 mg/dL (ref 8.9–10.3)
Chloride: 103 mmol/L (ref 98–111)
Creatinine, Ser: 0.74 mg/dL (ref 0.44–1.00)
GFR calc Af Amer: >60 mL/min (ref >60)
GFR calc non Af Amer: >60 mL/min (ref >60)
Glucose, Bld: 100 mg/dL — ABNORMAL HIGH (ref 70–99)
Potassium: 3.8 mmol/L (ref 3.5–5.1)
Sodium: 140 mmol/L (ref 135–145)
Total Bilirubin: 0.5 mg/dL (ref 0.3–1.2)
Total Protein: 6.4 g/dL — ABNORMAL LOW (ref 6.5–8.1)

## 2019-07-27 LAB — URINALYSIS, ROUTINE W REFLEX MICROSCOPIC
Bilirubin Urine: NEGATIVE
Glucose, UA: NEGATIVE mg/dL
Hgb urine dipstick: NEGATIVE
Ketones, ur: NEGATIVE mg/dL
Nitrite: NEGATIVE
Protein, ur: NEGATIVE mg/dL
Specific Gravity, Urine: 1.01 (ref 1.005–1.030)
pH: 6.5 (ref 5.0–8.0)

## 2019-07-27 LAB — ABO/RH: ABO/RH(D): A POS

## 2019-07-27 LAB — URINALYSIS, MICROSCOPIC (REFLEX): RBC / HPF: NONE SEEN RBC/hpf (ref 0–5)

## 2019-07-27 LAB — PREPARE RBC (CROSSMATCH)

## 2019-07-27 LAB — POC OCCULT BLOOD, ED: Fecal Occult Bld: NEGATIVE

## 2019-07-27 MED ORDER — SODIUM CHLORIDE 0.9% IV SOLUTION: Freq: Once | INTRAVENOUS | Status: DC

## 2019-07-27 NOTE — ED Provider Notes (Signed)
[facility] EMERGENCY DEPARTMENT Provider Note   CSN: [id number] Arrival date & time: 07/27/19  0913     History Chief Complaint  Patient presents with  . Abnormal Lab    Kristi Carr is a 74 y.o. female with PMHx IDA on iron supplements, breast cancer in 2016, HTN, who presents to the ED today for abnormal lab. Pt had labwork done at her PCP's office yesterday to recheck her iron levels. She was called informing her of her Hgb level 6.4 and told to come to the ED for further evaluation. Pt reports that sometimes her stools will be dark in color however she attributes this to taking iron. She denies any BRBPR. She had a colonoscopy done in November without acute findings besides internal hemorrhoids, recommended repeat in 5 years. Pt has felt intermittently weaker than normal for the past 2-3 months. Denies fevers, chills, blurry vision, double vision, syncope, abdominal pain, nausea, vomiting, or any other associated symptoms.   The history is provided by the patient and medical records.       Past Medical History:  Diagnosis Date  . Anemia   . Breast cancer of upper-outer quadrant of left female breast ([place])   . Gallstones 08/23/03   x 2  . Hot flashes   . Hypertension   . Kidney stones   . Radiation 10/14/13-12/02/13   left breast 50.4 gray, lumpectomy cavity boosted to 63 gray  . Wears glasses     Patient Active Problem List   Diagnosis Date Noted  . Status post cholecystectomy 12/22/2018  . Iron deficiency anemia due to chronic blood loss 07/31/2017  . Murmur 07/06/2017  . C. difficile diarrhea 03/11/2017  . Healthcare maintenance 12/15/2016  . Advance care planning 05/23/2015  . Malignant neoplasm of upper-outer quadrant of left breast in female, estrogen receptor positive ([place]) 08/11/2013  . Medicare annual wellness visit, subsequent 11/04/2012  . Hypercholesteremia 02/20/2011  . HTN (hypertension) 12/19/2010  . Vitamin D deficiency 06/22/2008  .  RENAL CALCULUS 08/19/2003    Past Surgical History:  Procedure Laterality Date  . BREAST LUMPECTOMY WITH SENTINEL LYMPH NODE BIOPSY  09/06/13   LEFT BREAST SEED GUIDED LUMPECTOMY WITH LEFT AXILLARY SENTINEL NODE BIOPSY (Left)  . CHOLECYSTECTOMY    . COLONOSCOPY    . PARTIAL HYSTERECTOMY  1980   ovaries left in  . TONSILLECTOMY    . TUBAL LIGATION    . WISDOM TOOTH EXTRACTION       OB History   No obstetric history on file.     Family History  Problem Relation Age of Onset  . Diabetes Mother   . Hyperlipidemia Mother   . Hypertension Mother   . Cancer Mother        Breast   . Breast cancer Mother   . Heart attack Father   . Heart disease Father   . Hypertension Sister   . Heart disease Brother        CHF  EF 10%  . Hypertension Brother   . Gout Brother   . Depression Neg Hx   . Alcohol abuse Neg Hx   . Drug abuse Neg Hx   . Colon cancer Neg Hx   . Uterine cancer Neg Hx   . Ovarian cancer Neg Hx   . Prostate cancer Neg Hx     Social History   Tobacco Use  . Smoking status: Never Smoker  . Smokeless tobacco: Never Used  Substance Use Topics  . Alcohol  use: No  . Drug use: No    Home Medications Prior to Admission medications   Medication Sig Start Date End Date Taking? Authorizing Provider  cholestyramine (QUESTRAN) 4 g packet TAKE 1/2 PACKET 2 TIMES A DAY AS NEEDED(LOOSE STOOL) Patient taking differently: Take 2 g by mouth 2 (two) times daily as needed. TAKE 1/2 PACKET 2 TIMES A DAY AS NEEDED(LOOSE STOOL) 12/18/18  Yes [name], MD  clobetasol cream (TEMOVATE) 0.05 % Use daily as needed. 03/10/17  Yes [name], MD  co-enzyme Q-10 30 MG capsule Take 100 mg by mouth daily.   Yes [provider]  ergocalciferol (VITAMIN D2) 50000 UNITS capsule Take 50,000 Units by mouth every 30 (thirty) days. 12/07/13  Yes [provider]  Ferrous Sulfate Dried (EQ SLOW-RELEASE IRON PO) Take 1 tablet by mouth daily.    Yes [provider]  ibuprofen (GNP IBUPROFEN) 200 MG tablet Take 200 mg by mouth as needed.     Yes [provider]  ketoconazole (NIZORAL) 2 % cream Apply 1 application topically daily. 01/09/18  Yes Magrinat, Virgie Dad, MD  metoprolol succinate (TOPROL-XL) 50 MG 24 hr tablet TAKE 1 TABLET BY MOUTH EVERY DAY Patient taking differently: Take 50 mg by mouth daily.  01/26/19  Yes [name], MD  multivitamin [facility] per tablet Take 1 tablet by mouth daily.     Yes [provider]  Omega-3 Fatty Acids (FISH OIL) 1000 MG CPDR 1 by mouth daily 05/23/15  Yes [name], MD  Cholecalciferol (VITAMIN D) 2000 units tablet Take 1 tablet (2,000 Units total) by mouth daily. Patient not taking: Reported on 07/27/2019 12/15/17   [name], MD  fluticasone [facility]) 50 MCG/ACT nasal spray Place 2 sprays into both nostrils daily as needed for allergies or rhinitis. Patient not taking: Reported on 07/27/2019 03/10/17   [name], MD    Allergies    Clindamycin/lincomycin  Review of Systems   Review of Systems  Constitutional: Positive for appetite change and fatigue. Negative for chills and fever.  Respiratory: Negative for cough and shortness of breath.   Cardiovascular: Negative for chest pain.  Gastrointestinal: Negative for abdominal pain, blood in stool, diarrhea, nausea and vomiting.  Neurological: Positive for light-headedness. Negative for syncope, weakness, numbness and headaches.  All other systems reviewed and are negative.   Physical Exam Updated Vital Signs BP (!) 163/88 (BP Location: Right Arm)   Pulse 96   Temp 98.2 F (36.8 C) (Oral)   Resp 14   Ht 5\' 5"  (1.651 m)   Wt 58.1 kg   SpO2 100%   BMI 21.30 kg/m   Physical Exam Vitals and nursing note reviewed.  Constitutional:      Appearance: She is not ill-appearing or diaphoretic.  HENT:     Head: Normocephalic and atraumatic.     Mouth/Throat:     Mouth: Mucous membranes are moist.  Eyes:      Extraocular Movements: Extraocular movements intact.     Conjunctiva/sclera: Conjunctivae normal.     Pupils: Pupils are equal, round, and reactive to light.  Cardiovascular:     Rate and Rhythm: Normal rate and regular rhythm.     Pulses: Normal pulses.  Pulmonary:     Effort: Pulmonary effort is normal.     Breath sounds: Normal breath sounds. No wheezing, rhonchi or rales.  Abdominal:     Palpations: Abdomen is soft.     Tenderness: There is no  abdominal tenderness. There is no guarding or rebound.  Genitourinary:    Comments: Chaperone present. Small external nonthrombosed hemorrhoid. Guaiac negative.  Musculoskeletal:     Cervical back: Neck supple.  Skin:    General: Skin is warm and dry.  Neurological:     Mental Status: She is alert.     Comments: CN 3-12 grossly intact A&O x4 GCS 15 Sensation and strength intact Gait nonataxic including with tandem walking Coordination with finger-to-nose WNL Neg romberg, neg pronator drift     ED Results / Procedures / Treatments   Labs (all labs ordered are listed, but only abnormal results are displayed) Labs Reviewed  COMPREHENSIVE METABOLIC PANEL - Abnormal; Notable for the following components:      Result Value   Glucose, Bld 100 (*)    Total Protein 6.4 (*)    All other components within normal limits  CBC WITH DIFFERENTIAL/PLATELET - Abnormal; Notable for the following components:   WBC 2.8 (*)    RBC 3.22 (*)    Hemoglobin 6.1 (*)    HCT 23.1 (*)    MCV 71.7 (*)    MCH 18.9 (*)    MCHC 26.4 (*)    RDW 17.3 (*)    Neutro Abs 1.4 (*)    Lymphs Abs 0.6 (*)    All other components within normal limits  URINALYSIS, ROUTINE W REFLEX MICROSCOPIC - Abnormal; Notable for the following components:   Leukocytes,Ua MODERATE (*)    All other components within normal limits  URINALYSIS, MICROSCOPIC (REFLEX) - Abnormal; Notable for the following components:   Bacteria, UA RARE (*)    All other components within normal  limits  POC OCCULT BLOOD, ED  TYPE AND SCREEN  ABO/RH  PREPARE RBC (CROSSMATCH)    EKG EKG Interpretation  Date/Time:  Tuesday July 27 2019 10:09:48 EST Ventricular Rate:  71 PR Interval:    QRS Duration: 101 QT Interval:  396 QTC Calculation: 431 R Axis:   21 Text Interpretation: Sinus rhythm No significant change since last tracing Confirmed by Lajean Saver [phone]) on 07/27/2019 11:37:55 AM   Radiology No results found.  Procedures Procedures (including critical care time)  Medications Ordered in ED Medications  0.9 %  sodium chloride infusion (Manually program via Guardrails IV Fluids) (has no administration in time range)    ED Course  I have reviewed the triage vital signs and the nursing notes.  Pertinent labs & imaging results that were available during my care of the patient were reviewed by me and considered in my medical decision making (see chart for details).  Clinical Course as of Jul 27 1547  Tue Jul 27, 2019  1119 Hemoglobin(!!): 6.1 [MV]  1124 Fecal Occult Blood, POC: NEGATIVE [MV]  1129 Fecal Occult Blood, POC: NEGATIVE [MV]    Clinical Course User Index [MV] Eustaquio Maize, PA-C   74 year old female who presents to the ED today after hgb found to be 6.4 by PCP yesterday after routine labwork. Has been having some generalized weakness intermittently for 2-3 months. No other symptoms. On arrival to the ED pt afebrile, nontachycardic, and nontachypneic. She is in NAD. No focal neuro deficits today. No abdominal tenderness.   Will repeat labwork today, U/A, do rectal exam to ensure no blood loss from rectum. If hgb still low will need blood transfusion.   CBC with hgb 6.1 today. Does appear to be leukopenic as well. FOBT negative. Pt in agreement for blood transfusion today however she is  requesting to go home afterwards as she takes care of her husband who is elderly and demented. Discussed case with attending physician Dr. Ashok Cordia who is in agreement  with plan if remainder of labwork unremarkable.   CMP without electrolyte abnormalities today.  U/A with moderate leuks however no nitrites, 0-5 wbcs per HPF, and 6-10 squamous cells.   Pt received 1 unit of PRBC thus far. Will need additional unit prior to discharge. Had lengthy discussion with pt regarding the fact that she will need to see her PCP in 1-2 days for repeat testing and further workup. It does appear pt was evaluated by her oncologist in 2018-2019 for her IDA without obvious cause. Pt already receiving oral iron supplements daily, may benefit from IV infusions. Strict return precautions discussed with pt in terms of returning to the ED as opposed to PCP including chest pain, SOB, pre syncope/syncope, vision changes, blood in stool. Pt is in agreement with plan.   3:47 PM At shift change case signed out to Kristi Hora, PA-C, who will dispo patient after 2 unit of PRBC.  MDM Rules/Calculators/A&P                       Final Clinical Impression(s) / ED Diagnoses Final diagnoses:  Iron deficiency anemia, unspecified iron deficiency anemia type  Encounter for blood transfusion    Rx / DC Orders ED Discharge Orders    None       Eustaquio Maize, PA-C 07/27/19 1549    Lajean Saver, MD 07/29/19 [phone]

## 2019-07-27 NOTE — Telephone Encounter (Signed)
I do not see where she went to the ER yet.  Please encourage her again to go.  Thanks.

## 2019-07-27 NOTE — ED Provider Notes (Signed)
74 year old with anemia. She is receiving 2 unites of blood. Plan is to d/c home after blood transfusion  8:00PM Blood transfusion is complete. Pt feels fine. Will d/c.   Recardo Evangelist, PA-C 07/27/19 2108    Charlesetta Shanks, MD 07/27/19 [phone]

## 2019-07-27 NOTE — Telephone Encounter (Signed)
Female voice answered (presumably husband) who says she has went to get the transfusion.

## 2019-07-27 NOTE — ED Triage Notes (Signed)
Pt sent by her PCP for an iron transfusion. States she has had some weakness intermittently for a week. Hgb yesterday was 6.4. Reports a colonoscopy in November and It was normal.

## 2019-07-27 NOTE — Telephone Encounter (Signed)
Noted. Thanks.

## 2019-07-27 NOTE — Discharge Instructions (Signed)
We have given you 2 units of blood in the ED today given your blood levels were at a critical level.   Please follow up with your PCP in the next 1-2 days for repeat testing and further evaluation. You may benefit from further evaluation by a hematologist as well as IV infusions.   Return to the ED IMMEDIATELY for any worsening symptoms including shortness of breath, chest pain, vomiting, dizziness/lightheadedness, passing out, vision changes, blood in stool.

## 2019-07-28 ENCOUNTER — Other Ambulatory Visit: Payer: Self-pay | Admitting: Family Medicine

## 2019-07-28 LAB — TYPE AND SCREEN
ABO/RH(D): A POS
Antibody Screen: NEGATIVE
Unit division: 0
Unit division: 0

## 2019-07-28 LAB — BPAM RBC
Blood Product Expiration Date: 202103162359
Blood Product Expiration Date: 202104102359
ISSUE DATE / TIME: 202103091317
ISSUE DATE / TIME: 202103091533
Unit Type and Rh: 600
Unit Type and Rh: 6200

## 2019-07-28 NOTE — Telephone Encounter (Signed)
Patient called. She stated she had her transfusion done. And was advised to follow up in a few days. She wanted to see if you are requesting her to have another lab draw done this week to check her levels again  Please advise

## 2019-07-28 NOTE — Telephone Encounter (Signed)
Can she get an office visit with me on Friday?  We can do labs at the time.  30 minutes would be great.  Thanks.

## 2019-07-30 ENCOUNTER — Other Ambulatory Visit: Payer: Self-pay

## 2019-07-30 ENCOUNTER — Encounter: Payer: Self-pay | Admitting: Family Medicine

## 2019-07-30 ENCOUNTER — Ambulatory Visit (INDEPENDENT_AMBULATORY_CARE_PROVIDER_SITE_OTHER): Payer: Medicare HMO | Admitting: Family Medicine

## 2019-07-30 VITALS — BP 162/82 | HR 66 | Temp 96.9°F | Ht 65.0 in | Wt 126.1 lb

## 2019-07-30 DIAGNOSIS — D509 Iron deficiency anemia, unspecified: Secondary | ICD-10-CM

## 2019-07-30 LAB — CBC WITH DIFFERENTIAL/PLATELET
Basophils Absolute: 0 10*3/uL (ref 0.0–0.1)
Basophils Relative: 0.3 % (ref 0.0–3.0)
Eosinophils Absolute: 0.3 10*3/uL (ref 0.0–0.7)
Eosinophils Relative: 6.3 % — ABNORMAL HIGH (ref 0.0–5.0)
HCT: 32.4 % — ABNORMAL LOW (ref 36.0–46.0)
Hemoglobin: 9.8 g/dL — ABNORMAL LOW (ref 12.0–15.0)
Lymphocytes Relative: 22.5 % (ref 12.0–46.0)
Lymphs Abs: 1.1 10*3/uL (ref 0.7–4.0)
MCHC: 30.3 g/dL (ref 30.0–36.0)
MCV: 73.3 fl — ABNORMAL LOW (ref 78.0–100.0)
Monocytes Absolute: 0.9 10*3/uL (ref 0.1–1.0)
Monocytes Relative: 17.1 % — ABNORMAL HIGH (ref 3.0–12.0)
Neutro Abs: 2.7 10*3/uL (ref 1.4–7.7)
Neutrophils Relative %: 53.8 % (ref 43.0–77.0)
Platelets: 395 10*3/uL (ref 150.0–400.0)
RBC: 4.42 Mil/uL (ref 3.87–5.11)
RDW: 22.6 % — ABNORMAL HIGH (ref 11.5–15.5)
WBC: 5 10*3/uL (ref 4.0–10.5)

## 2019-07-30 NOTE — Progress Notes (Signed)
This visit occurred during the SARS-CoV-2 public health emergency.  Safety protocols were in place, including screening questions prior to the visit, additional usage of staff PPE, and extensive cleaning of exam room while observing appropriate contact time as indicated for disinfecting solutions.  She had labs done here in was noted to have significant anemia.  Hemoglobin down to 6.  She was advised to go to the ER.  Here for f/u with anemia s/p transfusion.  She is still on iron at baseline.  She has continued this in the meantime.  She had been feeling slightly weak "like I needed sit down."  She feels some better in the meantime. She denies recent blood loss. No rectal bleeding or melena. Denies pain.   She has h/o anemia at baseline, prev baseline ~10.  She has lifelong anemia per patient.  IFOB neg at ER.    She has seen rheumatology in the meantime Sabino Niemann).  She had previously seen the GI clinic and also hematology.  She is not known to be passing any blood otherwise.  PMH and SH reviewed  ROS: Per HPI unless specifically indicated in ROS section   Meds, vitals, and allergies reviewed.   GEN: nad, alert and oriented HEENT: ncat NECK: supple w/o LA CV: rrr. PULM: ctab, no inc wob ABD: soft, +bs EXT: no edema SKIN: no acute rash  [facility] hematology.

## 2019-07-30 NOTE — Patient Instructions (Signed)
Go to the lab on the way out.   If you have mychart we'll likely use that to update you.    Let me update Dr. Jana Hakim and we'll from there.  Take care.  Glad to see you.

## 2019-08-01 NOTE — Assessment & Plan Note (Addendum)
I will update GI and hematology.  I specifically need hematology input about iron infusion as that may be able to be done at the hematology clinic.  She is not lightheaded now.  She is okay for outpatient follow-up.  She was able to work in the yard after she had a transfusion done and she clearly feels better.  Recheck labs today.  See notes on labs.  I appreciate the help of all involved.  At least 30 minutes were devoted to patient care in this encounter (this can potentially include time spent reviewing the patient's file/history, interviewing and examining the patient, counseling/reviewing plan with patient, ordering referrals, ordering tests, reviewing relevant laboratory or x-ray data, and documenting the encounter).

## 2019-08-02 ENCOUNTER — Other Ambulatory Visit: Payer: Self-pay | Admitting: Oncology

## 2019-08-03 ENCOUNTER — Telehealth: Payer: Self-pay | Admitting: Oncology

## 2019-08-03 NOTE — Telephone Encounter (Signed)
Scheduled appt per 3/15 sch message - pt aware of appt date and time

## 2019-08-13 NOTE — Progress Notes (Signed)
Intravenous Iron Formulation Change  Kristi Carr has insurance that requires a change in intravenous iron product from Feraheme to Venofer. Orders have been updated to reflect this change. Schedule not updated at this time, 1 infusion was scheduled at time of chart review and patient sees provider prior to first treatment. Dr. Jana Hakim notified of change.  The plan for iron therapy is as follows: Venofer 200mg  IV twice weekly x5 infusions.   Demetrius Charity, PharmD, BCPS, [facility] Pharmac[facility] Phone: [phone] 08/13/2019

## 2019-08-17 ENCOUNTER — Other Ambulatory Visit: Payer: Self-pay | Admitting: *Deleted

## 2019-08-17 DIAGNOSIS — C50412 Malignant neoplasm of upper-outer quadrant of left female breast: Secondary | ICD-10-CM

## 2019-08-17 DIAGNOSIS — Z170 Estrogen receptor positive status [ER+]: Secondary | ICD-10-CM

## 2019-08-17 NOTE — Progress Notes (Signed)
[place]  [phone] Fax:([phone]) [phone]     ID: Kristi Carr OB: January 03, 1946  MR#: [id number]  VZC#:[id number]  Patient Care Team: [name], MD as PCP - General (Family Medicine) Paula Compton, MD as Consulting Physician (Obstetrics and Gynecology) Webb Laws, [place] (Optometry) Rolm Bookbinder, MD as Consulting Physician (General Surgery) Gery Pray, MD as Consulting Physician (Radiation Oncology) Gawain Crombie, Virgie Dad, MD as Consulting Physician (Oncology) Richmond Campbell, MD as Consulting Physician (Gastroenterology)   CHIEF COMPLAINT: Iron deficiency anemia; history of estrogen receptor positive breast cancer  CURRENT THERAPY: Venofer   INTERVAL HISTORY: Kristi Carr returns today for follow-up of her iron deficiency anemia.  She had borderline iron results late last year.  She had a colonoscopy which showed some diverticular disease.  More recently she was feeling very faint and found her blood pressure to be low which took her to the emergency room.  There lab results on 07/26/2019 showed a hemoglobin of 6.4 and HCT of 21.5. She underwent transfusion 2 units packed red cells on 07/27/2019.  She was referred here by Dr. Damita Dunnings for further evaluation and treatment   REVIEW OF SYSTEMS: Kristi Carr currently has no fainting feelings palpitations or shortness of breath.  She is not aware of any bleeding site and has not had melena bright red blood per rectum hemoptysis or hematuria.  There has been no bruising.  She has lost about 15 pounds over the last year she tells me.  She had severe pain in her epigastrium last Thursday, 08/12/2019.  This got better on its own and then it got bad again on Sunday 08/15/2019.  It is since has resolved.  A detailed review of systems today was otherwise noncontributory   BREAST CANCER HISTORY:  from the original intake note:  Kristi Carr had routine screening mammography at [facility] 07[facility]16/2013. This suggested a mass at the  1:00 position in the left breast. Ultrasound of the left breast and axilla 07/23/2013 showed a 1.7 cm lobulated mass at the 2:00 position, which was hypoechoic. The left axilla was unremarkable.  Biopsy of the mass in question 08/02/2013 showed ([id number]) and invasive ductal (papillary) carcinoma, grade 1, estrogen receptor and progesterone receptor both 100% positive with strong staining intensity, with an MIB-1 of 9% and no HER-2 amplification, the signals ratio being 1.08 and date number per cell 1.95.  Her subsequent history is as detailed below   PAST MEDICAL HISTORY: Past Medical History:  Diagnosis Date  . Anemia   . Breast cancer of upper-outer quadrant of left female breast ([place])   . Gallstones 08/23/03   x 2  . Hot flashes   . Hypertension   . Kidney stones   . Radiation 10/14/13-12/02/13   left breast 50.4 gray, lumpectomy cavity boosted to 63 gray  . Wears glasses     PAST SURGICAL HISTORY: Past Surgical History:  Procedure Laterality Date  . BREAST LUMPECTOMY WITH SENTINEL LYMPH NODE BIOPSY  09/06/13   LEFT BREAST SEED GUIDED LUMPECTOMY WITH LEFT AXILLARY SENTINEL NODE BIOPSY (Left)  . CHOLECYSTECTOMY    . COLONOSCOPY    . PARTIAL HYSTERECTOMY  1980   ovaries left in  . TONSILLECTOMY    . TUBAL LIGATION    . WISDOM TOOTH EXTRACTION      FAMILY HISTORY Family History  Problem Relation Age of Onset  . Diabetes Mother   . Hyperlipidemia Mother   . Hypertension Mother   . Cancer Mother        Breast   .  Breast cancer Mother   . Heart attack Father   . Heart disease Father   . Hypertension Sister   . Heart disease Brother        CHF  EF 10%  . Hypertension Brother   . Gout Brother   . Depression Neg Hx   . Alcohol abuse Neg Hx   . Drug abuse Neg Hx   . Colon cancer Neg Hx   . Uterine cancer Neg Hx   . Ovarian cancer Neg Hx   . Prostate cancer Neg Hx    the patient's father died from a myocardial infarction at age 65. The patient's mother died from  intestinal blockage at age 96. The patient had one brother, one sister. The patient's mother was diagnosed with breast cancer at age 50. There is no other history of cancer in the family to her knowledge.   GYNECOLOGIC HISTORY:  Menarche age 32, first live birth age 39, the patient is Kristi Carr. She underwent hysterectomy in 1979 but her ovaries are still in place. She did not use hormone replacement.   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 Her husband Jenny Reichmann used to work for [employer]. Son Shanon Brow lives in [place] where he works as a Librarian, academic for [employer]. Son Randall Hiss lives in [place] as Government social research officer for [employer] and environmental services. The patient has 4 grandchildren. She is a Psychologist, forensic   ADVANCED DIRECTIVES: In the absence of any documentation to the contrary, the patient's spouse is their HCPOA.    HEALTH MAINTENANCE: Social History   Tobacco Use  . Smoking status: Never Smoker  . Smokeless tobacco: Never Used  Substance Use Topics  . Alcohol use: No  . Drug use: No     Colonoscopy: 2011  PAP: Status post hysterectomy  Bone density: 07/02/2016 with a T score of -2.5 osteoporosis.  Lipid panel:  Allergies  Allergen Reactions  . Clindamycin/Lincomycin Other (See Comments)    GI upset attributed to med.  Not an allergy.     Current Outpatient Medications  Medication Sig Dispense Refill  . cholestyramine (QUESTRAN) 4 g packet TAKE 1/2 PACKET 2 TIMES A DAY AS NEEDED(LOOSE STOOL) 60 packet 12  . clobetasol cream (TEMOVATE) 0.05 % Use daily as needed.    Marland Kitchen co-enzyme Q-10 30 MG capsule Take 100 mg by mouth daily.    . ergocalciferol (VITAMIN D2) 50000 UNITS capsule Take 50,000 Units by mouth every 30 (thirty) days.    . Ferrous Sulfate Dried (EQ SLOW-RELEASE IRON PO) Take 1 tablet by mouth daily.     Marland Kitchen ibuprofen (GNP IBUPROFEN) 200 MG tablet Take 200 mg by mouth as needed.      Marland Kitchen ketoconazole (NIZORAL) 2 % cream Apply 1 application topically  daily. 15 g 0  . metoprolol succinate (TOPROL-XL) 50 MG 24 hr tablet TAKE 1 TABLET BY MOUTH EVERY DAY 90 tablet 1  . multivitamin (THERAGRAN) per tablet Take 1 tablet by mouth daily.      . Omega-3 Fatty Acids (FISH OIL) 1000 MG CPDR 1 by mouth daily     No current facility-administered medications for this visit.    OBJECTIVE: Middle-aged white woman who appears stated age  74:   08/18/19 1308  BP: (!) 157/86  Pulse: 75  Resp: 18  Temp: 100 F (37.8 C)  SpO2: 100%     Body mass index is 20.48 kg/m.    ECOG FS:0 - Asymptomatic  Sclerae unicteric, EOMs intact Wearing  a mask No cervical or supraclavicular adenopathy Lungs no rales or rhonchi Heart regular rate and rhythm Abd soft, nontender, positive bowel sounds MSK no focal spinal tenderness, no upper extremity lymphedema Neuro: nonfocal, well oriented, appropriate affect Breasts: The right breast is benign.  The left breast is status post remote lumpectomy and radiation.  The cosmetic result is good.  There is no evidence of local recurrence.  Both axillae are benign.   LAB RESULTS:  CMP     Component Value Date/Time   NA 140 07/27/2019 0959   NA 143 08/01/2016 1016   K 3.8 07/27/2019 0959   K 3.8 08/01/2016 1016   CL 103 07/27/2019 0959   CO2 26 07/27/2019 0959   CO2 29 08/01/2016 1016   GLUCOSE 100 (H) 07/27/2019 0959   GLUCOSE 77 08/01/2016 1016   BUN 11 07/27/2019 0959   BUN 13.6 08/01/2016 1016   CREATININE 0.74 07/27/2019 0959   CREATININE 0.83 08/06/2018 1044   CREATININE 0.8 08/01/2016 1016   CALCIUM 8.9 07/27/2019 0959   CALCIUM 9.5 08/01/2016 1016   PROT 6.4 (L) 07/27/2019 0959   PROT 7.0 08/01/2016 1016   ALBUMIN 3.5 07/27/2019 0959   ALBUMIN 3.6 08/01/2016 1016   AST 18 07/27/2019 0959   AST 14 (L) 08/06/2018 1044   AST 18 08/01/2016 1016   ALT 14 07/27/2019 0959   ALT 10 08/06/2018 1044   ALT 13 08/01/2016 1016   ALKPHOS 49 07/27/2019 0959   ALKPHOS 45 08/01/2016 1016   BILITOT 0.5  07/27/2019 0959   BILITOT 0.4 08/06/2018 1044   BILITOT 0.44 08/01/2016 1016   GFRNONAA >60 07/27/2019 0959   GFRNONAA >60 08/06/2018 1044   GFRAA >60 07/27/2019 0959   GFRAA >60 08/06/2018 1044    I No results found for: SPEP  Lab Results  Component Value Date   WBC 4.6 08/18/2019   NEUTROABS 2.8 08/18/2019   HGB 9.8 (L) 08/18/2019   HCT 33.7 (L) 08/18/2019   MCV 80.8 08/18/2019   PLT 366 08/18/2019      Chemistry      Component Value Date/Time   NA 140 07/27/2019 0959   NA 143 08/01/2016 1016   K 3.8 07/27/2019 0959   K 3.8 08/01/2016 1016   CL 103 07/27/2019 0959   CO2 26 07/27/2019 0959   CO2 29 08/01/2016 1016   BUN 11 07/27/2019 0959   BUN 13.6 08/01/2016 1016   CREATININE 0.74 07/27/2019 0959   CREATININE 0.83 08/06/2018 1044   CREATININE 0.8 08/01/2016 1016      Component Value Date/Time   CALCIUM 8.9 07/27/2019 0959   CALCIUM 9.5 08/01/2016 1016   ALKPHOS 49 07/27/2019 0959   ALKPHOS 45 08/01/2016 1016   AST 18 07/27/2019 0959   AST 14 (L) 08/06/2018 1044   AST 18 08/01/2016 1016   ALT 14 07/27/2019 0959   ALT 10 08/06/2018 1044   ALT 13 08/01/2016 1016   BILITOT 0.5 07/27/2019 0959   BILITOT 0.4 08/06/2018 1044   BILITOT 0.44 08/01/2016 1016       No results found for: LABCA2  No components found for: LABCA125  No results for input(s): INR in the last 168 hours.  Urinalysis    Component Value Date/Time   COLORURINE YELLOW 07/27/2019 1103    STUDIES: No results found.   ASSESSMENT: 74 y.o. [place] woman status post left breast biopsy 08/02/2013 for a clinical T2 N0, stage IIA invasive papillary breast cancer, grade 1, estrogen receptor and  progesterone receptor both 100% positive, with an MIB-1 of 9% and no HER-2 amplification  (1) Status post left lumpectomy and sentinel lymph node sampling 09/06/2013 for a pT1c pN0, stage IA invasive ductal carcinoma, grade 1, repeat HER-2 again negative  (2) Adjuvant radiation completed  11/30/2013  (3) started tamoxifen 12/18/2013, completing 5 years April 2020  (4) deer tick bite February 2017--no sequela  (5) osteoporosis: [facility] density at [facility] 07/02/2017 showed a T score of -2.5  (6) C. difficile colitis--completing second round of antibiotics March 2019  (7)  anemia: as of 01/09/2018, ferritin was 16.2,, iron saturation, B12 and folate are normal as is the reticulocyte count and creatinine.   (a) REVIEW OF BLOOD FILM 01/09/2018 shows mild rouleaux, but no other abnormalities: Specifically there are no tailed poikilocytes or significant anisocytosis, no left shift in the white cell series, and no artifactual platelet counts  (b) ferritin on 07/26/2019 was 3.5 (severe deficiency)  (c) iron sucrose started 08/18/2019, to be repeated x5  (d) GI workup:   (I) colonoscopy 04/05/2019: diverticulosis   PLAN: Kristi Carr had significant iron deficiency anemia.  She is symptomatically better after 2 units of packed red cells and the fact that her hemoglobin is still close to 10 (up from less than 7) 2 weeks after the transfusion signifies that she is not losing blood at a high clip.  She understands that bleeding is the usual cause of iron deficiency.  She does have diverticular disease which was noted on colonoscopy last November.  She has symptoms of severe gastritis or peptic ulcer disease.  I suggested that she give omeprazole a try but she would prefer to wait until we have a more definitive diagnosis.  I am consulting Dr. Murvin Natal for consideration of EGD in her situation.  She will receive Venofer today and 4 more doses over the next 2 weeks.  She has a good understanding of the possible toxicities side effects and complications of this agent which is however usually very well-tolerated.  I am setting her up for repeat lab work here in about 8 weeks just to make sure everything is back to normal.  She probably will benefit from continuing monitoring of her ferritin and iron  saturation as well as hemoglobin in the future and she would prefer to do this through Dr. Josefine Class office which is much closer to her  Total encounter time 4 minutes.*  Kristi Carr, Virgie Dad, MD  08/18/19 1:52 PM Medical Oncology and Hematology [facility] [place], [place] [address] Tel. [phone]    Fax. [phone]   I, Wilburn Mylar, am acting as scribe for Dr. Virgie Dad. Kristi Carr.  I, Lurline Del MD, have reviewed the above documentation for accuracy and completeness, and I agree with the above.   *Total Encounter Time as defined by the Centers for Medicare and Medicaid Services includes, in addition to the face-to-face time of a patient visit (documented in the note above) non-face-to-face time: obtaining and reviewing outside history, ordering and reviewing medications, tests or procedures, care coordination (communications with other health care professionals or caregivers) and documentation in the medical record.

## 2019-08-18 ENCOUNTER — Inpatient Hospital Stay (HOSPITAL_BASED_OUTPATIENT_CLINIC_OR_DEPARTMENT_OTHER): Payer: Medicare HMO | Admitting: Oncology

## 2019-08-18 ENCOUNTER — Other Ambulatory Visit: Payer: Self-pay

## 2019-08-18 ENCOUNTER — Encounter: Payer: Self-pay | Admitting: Oncology

## 2019-08-18 ENCOUNTER — Inpatient Hospital Stay: Payer: Medicare HMO

## 2019-08-18 ENCOUNTER — Inpatient Hospital Stay: Payer: Medicare HMO | Attending: Oncology

## 2019-08-18 ENCOUNTER — Telehealth: Payer: Self-pay | Admitting: Oncology

## 2019-08-18 VITALS — BP 157/86 | HR 75 | Temp 100.0°F | Resp 18 | Ht 65.0 in | Wt 123.1 lb

## 2019-08-18 VITALS — BP 170/71 | HR 65 | Resp 16

## 2019-08-18 DIAGNOSIS — Z853 Personal history of malignant neoplasm of breast: Secondary | ICD-10-CM | POA: Insufficient documentation

## 2019-08-18 DIAGNOSIS — Z170 Estrogen receptor positive status [ER+]: Secondary | ICD-10-CM

## 2019-08-18 DIAGNOSIS — D508 Other iron deficiency anemias: Secondary | ICD-10-CM

## 2019-08-18 DIAGNOSIS — C50412 Malignant neoplasm of upper-outer quadrant of left female breast: Secondary | ICD-10-CM | POA: Diagnosis not present

## 2019-08-18 DIAGNOSIS — M810 Age-related osteoporosis without current pathological fracture: Secondary | ICD-10-CM | POA: Diagnosis not present

## 2019-08-18 DIAGNOSIS — D509 Iron deficiency anemia, unspecified: Secondary | ICD-10-CM | POA: Insufficient documentation

## 2019-08-18 LAB — CBC WITH DIFFERENTIAL (CANCER CENTER ONLY)
Abs Immature Granulocytes: 0.01 10*3/uL (ref 0.00–0.07)
Basophils Absolute: 0 10*3/uL (ref 0.0–0.1)
Basophils Relative: 0 %
Eosinophils Absolute: 0.3 10*3/uL (ref 0.0–0.5)
Eosinophils Relative: 6 %
HCT: 33.7 % — ABNORMAL LOW (ref 36.0–46.0)
Hemoglobin: 9.8 g/dL — ABNORMAL LOW (ref 12.0–15.0)
Immature Granulocytes: 0 %
Lymphocytes Relative: 22 %
Lymphs Abs: 1 10*3/uL (ref 0.7–4.0)
MCH: 23.5 pg — ABNORMAL LOW (ref 26.0–34.0)
MCHC: 29.1 g/dL — ABNORMAL LOW (ref 30.0–36.0)
MCV: 80.8 fL (ref 80.0–100.0)
Monocytes Absolute: 0.5 10*3/uL (ref 0.1–1.0)
Monocytes Relative: 11 %
Neutro Abs: 2.8 10*3/uL (ref 1.7–7.7)
Neutrophils Relative %: 61 %
Platelet Count: 366 10*3/uL (ref 150–400)
RBC: 4.17 MIL/uL (ref 3.87–5.11)
RDW: 25.3 % — ABNORMAL HIGH (ref 11.5–15.5)
WBC Count: 4.6 10*3/uL (ref 4.0–10.5)
nRBC: 0 % (ref 0.0–0.2)

## 2019-08-18 LAB — FERRITIN: Ferritin: 23 ng/mL (ref 11–307)

## 2019-08-18 LAB — IRON AND TIBC
Iron: 37 ug/dL — ABNORMAL LOW (ref 41–142)
Saturation Ratios: 10 % — ABNORMAL LOW (ref 21–57)
TIBC: 360 ug/dL (ref 236–444)
UIBC: 323 ug/dL (ref 120–384)

## 2019-08-18 MED ORDER — SODIUM CHLORIDE 0.9 % IV SOLN
200.0000 mg | Freq: Once | INTRAVENOUS | Status: AC
  Administered 2019-08-18: 200 mg via INTRAVENOUS
  Filled 2019-08-18: qty 200

## 2019-08-18 MED ORDER — SODIUM CHLORIDE 0.9 % IV SOLN
Freq: Once | INTRAVENOUS | Status: AC
  Filled 2019-08-18: qty 250

## 2019-08-18 NOTE — Patient Instructions (Signed)

## 2019-08-19 ENCOUNTER — Telehealth: Payer: Self-pay | Admitting: Oncology

## 2019-08-19 NOTE — Telephone Encounter (Signed)
Scheduled appts per 3/31 los. Pt confirmed new appt dates and times.

## 2019-08-24 ENCOUNTER — Other Ambulatory Visit: Payer: Self-pay

## 2019-08-24 ENCOUNTER — Inpatient Hospital Stay: Payer: Medicare HMO | Attending: Oncology

## 2019-08-24 VITALS — BP 137/63 | HR 60 | Temp 97.9°F | Resp 18

## 2019-08-24 DIAGNOSIS — D509 Iron deficiency anemia, unspecified: Secondary | ICD-10-CM | POA: Diagnosis not present

## 2019-08-24 DIAGNOSIS — D508 Other iron deficiency anemias: Secondary | ICD-10-CM

## 2019-08-24 MED ORDER — SODIUM CHLORIDE 0.9 % IV SOLN
200.0000 mg | Freq: Once | INTRAVENOUS | Status: AC
  Administered 2019-08-24: 15:00:00 200 mg via INTRAVENOUS
  Filled 2019-08-24: qty 200

## 2019-08-24 MED ORDER — SODIUM CHLORIDE 0.9 % IV SOLN
Freq: Once | INTRAVENOUS | Status: AC
  Filled 2019-08-24: qty 250

## 2019-08-24 NOTE — Patient Instructions (Signed)

## 2019-08-25 DIAGNOSIS — R197 Diarrhea, unspecified: Secondary | ICD-10-CM | POA: Diagnosis not present

## 2019-08-25 DIAGNOSIS — D509 Iron deficiency anemia, unspecified: Secondary | ICD-10-CM | POA: Diagnosis not present

## 2019-08-25 DIAGNOSIS — R634 Abnormal weight loss: Secondary | ICD-10-CM | POA: Diagnosis not present

## 2019-08-26 ENCOUNTER — Other Ambulatory Visit: Payer: Self-pay

## 2019-08-26 ENCOUNTER — Inpatient Hospital Stay: Payer: Medicare HMO

## 2019-08-26 VITALS — BP 140/71 | HR 69 | Temp 98.3°F | Resp 18

## 2019-08-26 DIAGNOSIS — D508 Other iron deficiency anemias: Secondary | ICD-10-CM

## 2019-08-26 DIAGNOSIS — D509 Iron deficiency anemia, unspecified: Secondary | ICD-10-CM | POA: Diagnosis not present

## 2019-08-26 MED ORDER — SODIUM CHLORIDE 0.9 % IV SOLN
200.0000 mg | Freq: Once | INTRAVENOUS | Status: AC
  Administered 2019-08-26: 200 mg via INTRAVENOUS
  Filled 2019-08-26: qty 200

## 2019-08-26 MED ORDER — SODIUM CHLORIDE 0.9 % IV SOLN
Freq: Once | INTRAVENOUS | Status: AC
  Filled 2019-08-26: qty 250

## 2019-08-26 NOTE — Patient Instructions (Signed)

## 2019-08-27 ENCOUNTER — Encounter: Payer: Self-pay | Admitting: Family Medicine

## 2019-08-31 ENCOUNTER — Inpatient Hospital Stay: Payer: Medicare HMO

## 2019-08-31 ENCOUNTER — Other Ambulatory Visit: Payer: Self-pay

## 2019-08-31 VITALS — BP 142/71 | HR 69 | Temp 98.7°F | Resp 18

## 2019-08-31 DIAGNOSIS — D508 Other iron deficiency anemias: Secondary | ICD-10-CM

## 2019-08-31 DIAGNOSIS — D509 Iron deficiency anemia, unspecified: Secondary | ICD-10-CM | POA: Diagnosis not present

## 2019-08-31 MED ORDER — SODIUM CHLORIDE 0.9 % IV SOLN
Freq: Once | INTRAVENOUS | Status: AC
  Filled 2019-08-31: qty 250

## 2019-08-31 MED ORDER — SODIUM CHLORIDE 0.9 % IV SOLN
200.0000 mg | Freq: Once | INTRAVENOUS | Status: AC
  Administered 2019-08-31: 200 mg via INTRAVENOUS
  Filled 2019-08-31: qty 200

## 2019-08-31 NOTE — Patient Instructions (Signed)

## 2019-09-02 ENCOUNTER — Other Ambulatory Visit: Payer: Self-pay

## 2019-09-02 ENCOUNTER — Inpatient Hospital Stay: Payer: Medicare HMO

## 2019-09-02 VITALS — BP 121/64 | HR 65 | Temp 98.0°F | Resp 18

## 2019-09-02 DIAGNOSIS — D508 Other iron deficiency anemias: Secondary | ICD-10-CM

## 2019-09-02 DIAGNOSIS — D509 Iron deficiency anemia, unspecified: Secondary | ICD-10-CM | POA: Diagnosis not present

## 2019-09-02 DIAGNOSIS — Z1152 Encounter for screening for COVID-19: Secondary | ICD-10-CM | POA: Diagnosis not present

## 2019-09-02 MED ORDER — SODIUM CHLORIDE 0.9 % IV SOLN
Freq: Once | INTRAVENOUS | Status: AC
  Filled 2019-09-02: qty 250

## 2019-09-02 MED ORDER — SODIUM CHLORIDE 0.9 % IV SOLN
200.0000 mg | Freq: Once | INTRAVENOUS | Status: AC
  Administered 2019-09-02: 200 mg via INTRAVENOUS
  Filled 2019-09-02: qty 200

## 2019-09-02 NOTE — Patient Instructions (Signed)

## 2019-09-09 DIAGNOSIS — K2970 Gastritis, unspecified, without bleeding: Secondary | ICD-10-CM | POA: Diagnosis not present

## 2019-09-09 DIAGNOSIS — K2930 Chronic superficial gastritis without bleeding: Secondary | ICD-10-CM | POA: Diagnosis not present

## 2019-09-09 DIAGNOSIS — K2950 Unspecified chronic gastritis without bleeding: Secondary | ICD-10-CM | POA: Diagnosis not present

## 2019-09-09 DIAGNOSIS — R634 Abnormal weight loss: Secondary | ICD-10-CM | POA: Diagnosis not present

## 2019-09-09 DIAGNOSIS — K319 Disease of stomach and duodenum, unspecified: Secondary | ICD-10-CM | POA: Diagnosis not present

## 2019-09-09 DIAGNOSIS — D509 Iron deficiency anemia, unspecified: Secondary | ICD-10-CM | POA: Diagnosis not present

## 2019-09-16 ENCOUNTER — Other Ambulatory Visit: Payer: Self-pay | Admitting: Medical

## 2019-09-16 ENCOUNTER — Telehealth: Payer: Self-pay | Admitting: *Deleted

## 2019-09-16 ENCOUNTER — Inpatient Hospital Stay (HOSPITAL_BASED_OUTPATIENT_CLINIC_OR_DEPARTMENT_OTHER): Payer: Medicare HMO | Admitting: Medical

## 2019-09-16 ENCOUNTER — Other Ambulatory Visit: Payer: Self-pay

## 2019-09-16 ENCOUNTER — Inpatient Hospital Stay: Payer: Medicare HMO

## 2019-09-16 ENCOUNTER — Telehealth: Payer: Self-pay

## 2019-09-16 DIAGNOSIS — D509 Iron deficiency anemia, unspecified: Secondary | ICD-10-CM | POA: Diagnosis not present

## 2019-09-16 DIAGNOSIS — D649 Anemia, unspecified: Secondary | ICD-10-CM

## 2019-09-16 DIAGNOSIS — D508 Other iron deficiency anemias: Secondary | ICD-10-CM

## 2019-09-16 LAB — CBC WITH DIFFERENTIAL (CANCER CENTER ONLY)
Abs Immature Granulocytes: 0.02 10*3/uL (ref 0.00–0.07)
Basophils Absolute: 0 10*3/uL (ref 0.0–0.1)
Basophils Relative: 0 %
Eosinophils Absolute: 0.2 10*3/uL (ref 0.0–0.5)
Eosinophils Relative: 2 %
HCT: 25.5 % — ABNORMAL LOW (ref 36.0–46.0)
Hemoglobin: 7.7 g/dL — ABNORMAL LOW (ref 12.0–15.0)
Immature Granulocytes: 0 %
Lymphocytes Relative: 11 %
Lymphs Abs: 0.8 10*3/uL (ref 0.7–4.0)
MCH: 27.1 pg (ref 26.0–34.0)
MCHC: 30.2 g/dL (ref 30.0–36.0)
MCV: 89.8 fL (ref 80.0–100.0)
Monocytes Absolute: 0.6 10*3/uL (ref 0.1–1.0)
Monocytes Relative: 9 %
Neutro Abs: 5.6 10*3/uL (ref 1.7–7.7)
Neutrophils Relative %: 78 %
Platelet Count: 252 10*3/uL (ref 150–400)
RBC: 2.84 MIL/uL — ABNORMAL LOW (ref 3.87–5.11)
RDW: 29.6 % — ABNORMAL HIGH (ref 11.5–15.5)
WBC Count: 7.2 10*3/uL (ref 4.0–10.5)
nRBC: 0 % (ref 0.0–0.2)

## 2019-09-16 LAB — FERRITIN: Ferritin: 213 ng/mL (ref 11–307)

## 2019-09-16 NOTE — Telephone Encounter (Signed)
Pt is feeling weak today. Took her BP and it was 100/70. She was feeling better after recent iron infusions. Asking if she should be concerned. She takes metoprolol daily. I advised her Dr Damita Dunnings is out of the office. She will call the hematologist, also.

## 2019-09-16 NOTE — Telephone Encounter (Signed)
This RN spoke with Kristi Carr per her call stating she had episode this am of feeling very dizzy and leg weakness.  " And I have felt so good after getting the blood at the hospital and then the iron infusions ".  She took her BP and it was lower then normal.  She contacted her primary MD per above ( prescribes metoprolol ) and was advised to call this office.  She states she is unsure what to do " maybe I am ok but I was told my heart could stop and now I am all flustered "  Note Kristi Carr took metoprolol this AM prior to above episode.  Per discussion - Kristi Carr will come in for lab check and nurse review to report to MD for further recommendation.

## 2019-09-16 NOTE — Telephone Encounter (Signed)
Spoke to pt. She went to the hematologist this afternoon and her hemoglobin had dropped in 4 weeks. She is scheduled for another infusion tomorrow. She will Do 1/2 a metoprolol the next few days and let us know if that helps.

## 2019-09-16 NOTE — Telephone Encounter (Signed)
She is on metoprolol 50mg  XL daily. Recommend drop metoprolol XL to 25mg  daily (1/2 tab) and monitor blood pressures daily over the next 4 days, call us Monday with readings. Let us know sooner if dizziness persists.

## 2019-09-16 NOTE — Progress Notes (Signed)
Received call from RN Tivis Ringer (MD Magrinat's office) regarding pt's reported weakess & fatigue and abnormal lab results today.  RN Val requests pt be assessed by [facility] PA Lucianne Lei d/t her symptoms.  PA Lucianne Lei states he will see the pt in the lobby to discuss a transfusion tomorrow in [facility] at 1 pm and to inform pt to keep blue blood bracelet on for appt.  Orders placed for 1 unit PRBCs and high priority scheduling message sent to schedule blood tomorrow in [facility] at 1 pm.  No RN assessment at this time, PA [facility] aware.  Confirmed receipt of all blood orders necessary to prepare 1 unit PRBCS with [place] in [place].

## 2019-09-16 NOTE — Progress Notes (Signed)
Ms. Kristi Carr was seen outside the lab today.  Her labs are reviewed with her.  She will be returning tomorrow at 1 PM for a transfusion of 1 unit of packed red blood cells.  Sandi Mealy, MHS, PA-C Physician Assistant

## 2019-09-17 ENCOUNTER — Inpatient Hospital Stay: Payer: Medicare HMO

## 2019-09-17 ENCOUNTER — Other Ambulatory Visit: Payer: Self-pay

## 2019-09-17 DIAGNOSIS — D649 Anemia, unspecified: Secondary | ICD-10-CM

## 2019-09-17 DIAGNOSIS — D509 Iron deficiency anemia, unspecified: Secondary | ICD-10-CM | POA: Diagnosis not present

## 2019-09-17 LAB — SAMPLE TO BLOOD BANK

## 2019-09-17 LAB — ABO/RH: ABO/RH(D): A POS

## 2019-09-17 LAB — PREPARE RBC (CROSSMATCH)

## 2019-09-17 MED ORDER — ACETAMINOPHEN 325 MG PO TABS
ORAL_TABLET | ORAL | Status: AC
  Filled 2019-09-17: qty 2

## 2019-09-17 MED ORDER — ACETAMINOPHEN 325 MG PO TABS
650.0000 mg | ORAL_TABLET | Freq: Once | ORAL | Status: AC
  Administered 2019-09-17: 650 mg via ORAL

## 2019-09-17 MED ORDER — SODIUM CHLORIDE 0.9% IV SOLUTION
250.0000 mL | Freq: Once | INTRAVENOUS | Status: AC
  Administered 2019-09-17: 13:00:00 250 mL via INTRAVENOUS
  Filled 2019-09-17: qty 250

## 2019-09-17 NOTE — Patient Instructions (Signed)
Blood Transfusion, Adult, Care After This sheet gives you information about how to care for yourself after your procedure. Your doctor may also give you more specific instructions. If you have problems or questions, contact your doctor. What can I expect after the procedure? After the procedure, it is common to have:  Bruising and soreness at the IV site.  A fever or chills on the day of the procedure. This may be your body's response to the new blood cells received.  A headache. Follow these instructions at home: Insertion site care      Follow instructions from your doctor about how to take care of your insertion site. This is where an IV tube was put into your vein. Make sure you: ? Wash your hands with soap and water before and after you change your bandage (dressing). If you cannot use soap and water, use hand sanitizer. ? Change your bandage as told by your doctor.  Check your insertion site every day for signs of infection. Check for: ? Redness, swelling, or pain. ? Bleeding from the site. ? Warmth. ? Pus or a bad smell. General instructions  Take over-the-counter and prescription medicines only as told by your doctor.  Rest as told by your doctor.  Go back to your normal activities as told by your doctor.  Keep all follow-up visits as told by your doctor. This is important. Contact a doctor if:  You have itching or red, swollen areas of skin (hives).  You feel worried or nervous (anxious).  You feel weak after doing your normal activities.  You have redness, swelling, warmth, or pain around the insertion site.  You have blood coming from the insertion site, and the blood does not stop with pressure.  You have pus or a bad smell coming from the insertion site. Get help right away if:  You have signs of a serious reaction. This may be coming from an allergy or the body's defense system (immune system). Signs include: ? Trouble breathing or shortness of  breath. ? Swelling of the face or feeling warm (flushed). ? Fever or chills. ? Head, chest, or back pain. ? Dark pee (urine) or blood in the pee. ? Widespread rash. ? Fast heartbeat. ? Feeling dizzy or light-headed. You may receive your blood transfusion in an outpatient setting. If so, you will be told whom to contact to report any reactions. These symptoms may be an emergency. Do not wait to see if the symptoms will go away. Get medical help right away. Call your local emergency services (911 in the [place] Do not drive yourself to the hospital. Summary  Bruising and soreness at the IV site are common.  Check your insertion site every day for signs of infection.  Rest as told by your doctor. Go back to your normal activities as told by your doctor.  Get help right away if you have signs of a serious reaction. This information is not intended to replace advice given to you by your health care provider. Make sure you discuss any questions you have with your health care provider. Document Revised: 10/29/2018 Document Reviewed: 10/29/2018 Elsevier Patient Education  2020 [employer]     Blood Transfusion, Adult A blood transfusion is a procedure in which you receive blood or a type of blood cell (blood component) through an IV. You may need a blood transfusion when your blood level is low. This may result from a bleeding disorder, illness, injury, or surgery. The blood may come   from a donor. You may also be able to donate blood for yourself (autologous blood donation) before a planned surgery. The blood given in a transfusion is made up of different blood components. You may receive:  Red blood cells. These carry oxygen to the cells in the body.  Platelets. These help your blood to clot.  Plasma. This is the liquid part of your blood. It carries proteins and other substances throughout the body.  White blood cells. These help you fight infections. If you have hemophilia or  another clotting disorder, you may also receive other types of blood products. Tell a health care provider about:  Any blood disorders you have.  Any previous reactions you have had during a blood transfusion.  Any allergies you have.  All medicines you are taking, including vitamins, herbs, eye drops, creams, and over-the-counter medicines.  Any surgeries you have had.  Any medical conditions you have, including any recent fever or cold symptoms.  Whether you are pregnant or may be pregnant. What are the risks? Generally, this is a safe procedure. However, problems may occur.  The most common problems include: ? A mild allergic reaction, such as red, swollen areas of skin (hives) and itching. ? Fever or chills. This may be the body's response to new blood cells received. This may occur during or up to 4 hours after the transfusion.  More serious problems may include: ? Transfusion-associated circulatory overload (TACO), or too much fluid in the lungs. This may cause breathing problems. ? A serious allergic reaction, such as difficulty breathing or swelling around the face and lips. ? Transfusion-related acute lung injury (TRALI), which causes breathing difficulty and low oxygen in the blood. This can occur within hours of the transfusion or several days later. ? Iron overload. This can happen after receiving many blood transfusions over a period of time. ? Infection or virus being transmitted. This is rare because donated blood is carefully tested before it is given. ? Hemolytic transfusion reaction. This is rare. It happens when your body's defense system (immune system)tries to attack the new blood cells. Symptoms may include fever, chills, nausea, low blood pressure, and low back or chest pain. ? Transfusion-associated graft-versus-host disease (TAGVHD). This is rare. It happens when donated cells attack your body's healthy tissues. What happens before the  procedure? Medicines Ask your health care provider about:  Changing or stopping your regular medicines. This is especially important if you are taking diabetes medicines or blood thinners.  Taking medicines such as aspirin and ibuprofen. These medicines can thin your blood. Do not take these medicines unless your health care provider tells you to take them.  Taking over-the-counter medicines, vitamins, herbs, and supplements. General instructions  Follow instructions from your health care provider about eating and drinking restrictions.  You will have a blood test to determine your blood type. This is necessary to know what kind of blood your body will accept and to match it to the donor blood.  If you are going to have a planned surgery, you may be able to do an autologous blood donation. This may be done in case you need to have a transfusion.  You will have your temperature, blood pressure, and pulse monitored before the transfusion.  If you have had an allergic reaction to a transfusion in the past, you may be given medicine to help prevent a reaction. This medicine may be given to you by mouth (orally) or through an IV.  Set aside time for   the blood transfusion. This procedure generally takes 1-4 hours to complete. What happens during the procedure?   An IV will be inserted into one of your veins.  The bag of donated blood will be attached to your IV. The blood will then enter through your vein.  Your temperature, blood pressure, and pulse will be monitored regularly during the transfusion. This monitoring is done to detect early signs of a transfusion reaction.  Tell your nurse right away if you have any of these symptoms during the transfusion: ? Shortness of breath or trouble breathing. ? Chest or back pain. ? Fever or chills. ? Hives or itching.  If you have any signs or symptoms of a reaction, your transfusion will be stopped and you may be given medicine.  When the  transfusion is complete, your IV will be removed.  Pressure may be applied to the IV site for a few minutes.  A bandage (dressing)will be applied. The procedure may vary among health care providers and hospitals. What happens after the procedure?  Your temperature, blood pressure, pulse, breathing rate, and blood oxygen level will be monitored until you leave the hospital or clinic.  Your blood may be tested to see how you are responding to the transfusion.  You may be warmed with fluids or blankets to maintain a normal body temperature.  If you receive your blood transfusion in an outpatient setting, you will be told whom to contact to report any reactions. Where to find more information For more information on blood transfusions, visit the [employer]: redcross.org Summary  A blood transfusion is a procedure in which you receive blood or a type of blood cell (blood component) through an IV.  The blood you receive may come from a donor or be donated by yourself (autologous blood donation) before a planned surgery.  The blood given in a transfusion is made up of different blood components. You may receive red blood cells, platelets, plasma, or white blood cells depending on the condition treated.  Your temperature, blood pressure, and pulse will be monitored before, during, and after the transfusion.  After the transfusion, your blood may be tested to see how your body has responded. This information is not intended to replace advice given to you by your health care provider. Make sure you discuss any questions you have with your health care provider. Document Revised: 10/29/2018 Document Reviewed: 10/29/2018 Elsevier Patient Education  2020 [employer]   

## 2019-09-18 LAB — TYPE AND SCREEN
ABO/RH(D): A POS
Antibody Screen: NEGATIVE
Unit division: 0

## 2019-09-18 LAB — BPAM RBC
Blood Product Expiration Date: 202105182359
ISSUE DATE / TIME: 202104301347
Unit Type and Rh: 6200

## 2019-09-21 ENCOUNTER — Telehealth: Payer: Self-pay | Admitting: *Deleted

## 2019-09-21 NOTE — Telephone Encounter (Signed)
If she is feeling better in the meantime then I would continue the half dose for now.  Please have her update [place] about her blood pressure and pulse next week (or sooner if she is feeling unwell).  Thanks.

## 2019-09-21 NOTE — Telephone Encounter (Signed)
Patient called stating that she was told last week to cut her metoprolol medication in 1/2 until after her blood transfusion on Friday. Patient stated that her blood pressure Friday am before her transfusion was 140/67. Patient stated Saturday am before her 1/2 pill her blood pressure was 144/70 and that pm it was 142/76. Patient stated today before her 1/2 pill her blood pressure was 142/82 and two hours after her 1/2 pill it was 129/71. Patient stated her heart rate has been running 71-80. Patient stated that she has not had any dizziness or any other symptoms. Patient stated that she is feeling better since the transfusion. Patient wants to know if she should continue the 1/2 metoprolol pill or go back to a whole one?

## 2019-09-22 ENCOUNTER — Other Ambulatory Visit: Payer: Self-pay | Admitting: Emergency Medicine

## 2019-09-22 DIAGNOSIS — D649 Anemia, unspecified: Secondary | ICD-10-CM

## 2019-09-22 NOTE — Telephone Encounter (Signed)
Left message on answering machine for patient to call back.  

## 2019-09-22 NOTE — Telephone Encounter (Signed)
Pt returned call. I relayed the message and she verbalized understanding. She states she took her pulse this morning and it was at 96. She went outside for a little bit and came back inside to check it again. It was 107. She states she feels okay with no dizziness or anything else.

## 2019-09-22 NOTE — Telephone Encounter (Signed)
If her pulse is usually below 100 and she only has brief mild elevations above 100, then I would continue as is for now and update [place] next week.  Thanks.

## 2019-09-23 NOTE — Telephone Encounter (Signed)
Patient advised.

## 2019-10-02 ENCOUNTER — Other Ambulatory Visit: Payer: Self-pay

## 2019-10-02 ENCOUNTER — Encounter (HOSPITAL_COMMUNITY): Payer: Self-pay

## 2019-10-02 ENCOUNTER — Emergency Department (HOSPITAL_COMMUNITY)
Admission: EM | Admit: 2019-10-02 | Discharge: 2019-10-02 | Disposition: A | Discharge status: Home / Self Care | Payer: Medicare HMO | Attending: Emergency Medicine | Admitting: Emergency Medicine

## 2019-10-02 ENCOUNTER — Emergency Department (HOSPITAL_COMMUNITY): Payer: Medicare HMO

## 2019-10-02 DIAGNOSIS — R0602 Shortness of breath: Secondary | ICD-10-CM | POA: Diagnosis not present

## 2019-10-02 DIAGNOSIS — R531 Weakness: Secondary | ICD-10-CM | POA: Diagnosis present

## 2019-10-02 DIAGNOSIS — D509 Iron deficiency anemia, unspecified: Secondary | ICD-10-CM | POA: Diagnosis not present

## 2019-10-02 DIAGNOSIS — K921 Melena: Secondary | ICD-10-CM | POA: Insufficient documentation

## 2019-10-02 DIAGNOSIS — Z79899 Other long term (current) drug therapy: Secondary | ICD-10-CM | POA: Insufficient documentation

## 2019-10-02 DIAGNOSIS — I10 Essential (primary) hypertension: Secondary | ICD-10-CM | POA: Diagnosis not present

## 2019-10-02 DIAGNOSIS — Z853 Personal history of malignant neoplasm of breast: Secondary | ICD-10-CM | POA: Insufficient documentation

## 2019-10-02 LAB — URINALYSIS, ROUTINE W REFLEX MICROSCOPIC
Bacteria, UA: NONE SEEN
Bilirubin Urine: NEGATIVE
Glucose, UA: NEGATIVE mg/dL
Hgb urine dipstick: NEGATIVE
Ketones, ur: NEGATIVE mg/dL
Nitrite: NEGATIVE
Protein, ur: NEGATIVE mg/dL
Specific Gravity, Urine: 1.018 (ref 1.005–1.030)
pH: 5 (ref 5.0–8.0)

## 2019-10-02 LAB — BASIC METABOLIC PANEL
Anion gap: 7 (ref 5–15)
BUN: 19 mg/dL (ref 8–23)
CO2: 27 mmol/L (ref 22–32)
Calcium: 9.1 mg/dL (ref 8.9–10.3)
Chloride: 106 mmol/L (ref 98–111)
Creatinine, Ser: 0.83 mg/dL (ref 0.44–1.00)
GFR calc Af Amer: >60 mL/min (ref >60)
GFR calc non Af Amer: >60 mL/min (ref >60)
Glucose, Bld: 93 mg/dL (ref 70–99)
Potassium: 3.9 mmol/L (ref 3.5–5.1)
Sodium: 140 mmol/L (ref 135–145)

## 2019-10-02 LAB — CBC
HCT: 26.5 % — ABNORMAL LOW (ref 36.0–46.0)
Hemoglobin: 7.7 g/dL — ABNORMAL LOW (ref 12.0–15.0)
MCH: 28 pg (ref 26.0–34.0)
MCHC: 29.1 g/dL — ABNORMAL LOW (ref 30.0–36.0)
MCV: 96.4 fL (ref 80.0–100.0)
Platelets: 421 10*3/uL — ABNORMAL HIGH (ref 150–400)
RBC: 2.75 MIL/uL — ABNORMAL LOW (ref 3.87–5.11)
RDW: 19.2 % — ABNORMAL HIGH (ref 11.5–15.5)
WBC: 7.6 10*3/uL (ref 4.0–10.5)
nRBC: 0 % (ref 0.0–0.2)

## 2019-10-02 LAB — IRON AND TIBC
Iron: 21 ug/dL — ABNORMAL LOW (ref 28–170)
Saturation Ratios: 6 % — ABNORMAL LOW (ref 10.4–31.8)
TIBC: 358 ug/dL (ref 250–450)
UIBC: 337 ug/dL

## 2019-10-02 LAB — CBG MONITORING, ED: Glucose-Capillary: 81 mg/dL (ref 70–99)

## 2019-10-02 LAB — RETICULOCYTES
Immature Retic Fract: 31 % — ABNORMAL HIGH (ref 2.3–15.9)
RBC.: 2.34 MIL/uL — ABNORMAL LOW (ref 3.87–5.11)
Retic Count, Absolute: 117.9 10*3/uL (ref 19.0–186.0)
Retic Ct Pct: 5 % — ABNORMAL HIGH (ref 0.4–3.1)

## 2019-10-02 LAB — FERRITIN: Ferritin: 70 ng/mL (ref 11–307)

## 2019-10-02 LAB — FOLATE: Folate: 19.7 ng/mL (ref >5.9)

## 2019-10-02 LAB — PREPARE RBC (CROSSMATCH)

## 2019-10-02 LAB — POC OCCULT BLOOD, ED: Fecal Occult Bld: POSITIVE — AB

## 2019-10-02 LAB — VITAMIN B12: Vitamin B-12: 508 pg/mL (ref 180–914)

## 2019-10-02 MED ORDER — SODIUM CHLORIDE 0.9 % IV SOLN: 10.0000 mL/h | Freq: Once | INTRAVENOUS | Status: DC

## 2019-10-02 MED ORDER — SODIUM CHLORIDE 0.9% FLUSH
3.0000 mL | Freq: Once | INTRAVENOUS | Status: AC
  Administered 2019-10-02: 3 mL via INTRAVENOUS

## 2019-10-02 NOTE — ED Notes (Signed)
Call to blood bank to check on blood

## 2019-10-02 NOTE — ED Triage Notes (Signed)
Pt presents with c/o weakness since yesterday. Pt reports a hx of gastritis and needing blood transfusions and reports that this is the way that she felt those times as well. Pt reports she feels very week and does have a little bit of pain in her stomach at this time.

## 2019-10-02 NOTE — ED Notes (Signed)
Pt verbalizes understanding of DC instructions. Pt belongings returned and is ambulatory out of ED.  

## 2019-10-02 NOTE — ED Provider Notes (Addendum)
[facility] DEPT Provider Note   CSN: [id number] Arrival date & time: 10/02/19  [id number]     History Chief Complaint  Patient presents with  . Weakness    Kristi Carr is a 74 y.o. female with past medical history significant for anemia, breast cancer, gallstones, hypertension presents to emergency department today with chief complaint of progressively worsening generalized weakness x2 days.  Patient states she woke up this morning and was feeling very winded.  She had weakness in her bilateral lower extremities.  She checked her blood pressure and it was 96 systolic.  She states this is lower than it usually is.  She was able to sit down and rest and her symptoms resolved however she wanted to be evaluated as she has felt this way in the past when she required blood transfusions.  Patient states she has also been closely monitoring her heart rate.  She did notice over the last week when active it would elevate to 100+ however it would decrease to the 80s when she was resting.  She talked to her PCP about this who recommended she continue to monitor this and come to the ED if heart rate stays above 100 consistently.  She does admit to black stool x2 weeks ago but states her stool has been normal for the last x1 week.  She denies any fever, chills, chest pain, pleuritic pain, cough, hemoptysis, abdominal pain, nausea, vomiting, urinary symptoms, blood in stool, black tarry stool.  She did not take any medications for symptoms prior to arrival.  Chart review shows patient has had blood transfusions in the past.  She received 2 units on 07/27/2019 and 1 unit on 09/17/2019.  She also is receiving iron transfusions.  Her last transfusion was 09/14/2019. She also had an EGD on 08/25/2019 that showed severe gastritis of antrum and body of stomach with a single ulcerated area. GI thought there was no underlying cancer but there was severe inflammation.    Past Medical History:   Diagnosis Date  . Anemia   . Breast cancer of upper-outer quadrant of left female breast ([place])   . Gallstones 08/23/03   x 2  . Hot flashes   . Hypertension   . Kidney stones   . Radiation 10/14/13-12/02/13   left breast 50.4 gray, lumpectomy cavity boosted to 63 gray  . Wears glasses     Patient Active Problem List   Diagnosis Date Noted  . Status post cholecystectomy 12/22/2018  . IDA (iron deficiency anemia) 07/31/2017  . Murmur 07/06/2017  . C. difficile diarrhea 03/11/2017  . Healthcare maintenance 12/15/2016  . Advance care planning 05/23/2015  . Malignant neoplasm of upper-outer quadrant of left breast in female, estrogen receptor positive ([place]) 08/11/2013  . Medicare annual wellness visit, subsequent 11/04/2012  . Hypercholesteremia 02/20/2011  . HTN (hypertension) 12/19/2010  . Vitamin D deficiency 06/22/2008  . RENAL CALCULUS 08/19/2003    Past Surgical History:  Procedure Laterality Date  . BREAST LUMPECTOMY WITH SENTINEL LYMPH NODE BIOPSY  09/06/13   LEFT BREAST SEED GUIDED LUMPECTOMY WITH LEFT AXILLARY SENTINEL NODE BIOPSY (Left)  . CHOLECYSTECTOMY    . COLONOSCOPY    . PARTIAL HYSTERECTOMY  1980   ovaries left in  . TONSILLECTOMY    . TUBAL LIGATION    . WISDOM TOOTH EXTRACTION       OB History   No obstetric history on file.     Family History  Problem Relation Age of Onset  .  Diabetes Mother   . Hyperlipidemia Mother   . Hypertension Mother   . Cancer Mother        Breast   . Breast cancer Mother   . Heart attack Father   . Heart disease Father   . Hypertension Sister   . Heart disease Brother        CHF  EF 10%  . Hypertension Brother   . Gout Brother   . Depression Neg Hx   . Alcohol abuse Neg Hx   . Drug abuse Neg Hx   . Colon cancer Neg Hx   . Uterine cancer Neg Hx   . Ovarian cancer Neg Hx   . Prostate cancer Neg Hx     Social History   Tobacco Use  . Smoking status: Never Smoker  . Smokeless tobacco: Never Used  Substance  Use Topics  . Alcohol use: No  . Drug use: No    Home Medications Prior to Admission medications   Medication Sig Start Date End Date Taking? Authorizing Provider  cholestyramine (QUESTRAN) 4 g packet TAKE 1/2 PACKET 2 TIMES A DAY AS NEEDED(LOOSE STOOL) 12/18/18   [name], MD  clobetasol cream (TEMOVATE) 0.05 % Use daily as needed. 03/10/17   [name], MD  co-enzyme Q-10 30 MG capsule Take 100 mg by mouth daily.    [provider]  ergocalciferol (VITAMIN D2) 50000 UNITS capsule Take 50,000 Units by mouth every 30 (thirty) days. 12/07/13   [provider]  Ferrous Sulfate Dried (EQ SLOW-RELEASE IRON PO) Take 1 tablet by mouth daily.     [provider]  ibuprofen (GNP IBUPROFEN) 200 MG tablet Take 200 mg by mouth as needed.      [provider]  ketoconazole (NIZORAL) 2 % cream Apply 1 application topically daily. 01/09/18   Magrinat, Virgie Dad, MD  metoprolol succinate (TOPROL-XL) 50 MG 24 hr tablet TAKE 1 TABLET BY MOUTH EVERY DAY 07/28/19   [name], MD  multivitamin [facility]) per tablet Take 1 tablet by mouth daily.      [provider]  Omega-3 Fatty Acids (FISH OIL) 1000 MG CPDR 1 by mouth daily 05/23/15   [name], MD  omeprazole (PRILOSEC) 40 MG capsule Take 40 mg by mouth daily. 09/09/19   [provider]    Allergies    Clindamycin/lincomycin  Review of Systems   Review of Systems All other systems are reviewed and are negative for acute change except as noted in the HPI.  Physical Exam Updated Vital Signs BP (!) 141/75 (BP Location: Right Arm)   Pulse 78   Temp 97.6 F (36.4 C) (Oral)   Resp 15   SpO2 100%   Physical Exam Vitals and nursing note reviewed.  Constitutional:      General: She is not in acute distress.    Appearance: She is not ill-appearing.  HENT:     Head: Normocephalic and atraumatic.     Right Ear: Tympanic membrane and external ear normal.     Left Ear: Tympanic  membrane and external ear normal.     Nose: Nose normal.     Mouth/Throat:     Mouth: Mucous membranes are moist.     Pharynx: Oropharynx is clear.  Eyes:     General: No scleral icterus.       Right eye: No discharge.        Left eye: No discharge.     Extraocular Movements: Extraocular  movements intact.     Conjunctiva/sclera: Conjunctivae normal.     Pupils: Pupils are equal, round, and reactive to light.  Neck:     Vascular: No JVD.  Cardiovascular:     Rate and Rhythm: Normal rate and regular rhythm.     Pulses: Normal pulses.          Radial pulses are 2+ on the right side and 2+ on the left side.     Heart sounds: Normal heart sounds.  Pulmonary:     Comments: Lungs clear to auscultation in all fields. Symmetric chest rise. No wheezing, rales, or rhonchi. Abdominal:     Comments: Abdomen is soft, non-distended, and non-tender in all quadrants. No rigidity, no guarding. No peritoneal signs.  Musculoskeletal:        General: Normal range of motion.     Cervical back: Normal range of motion.  Skin:    General: Skin is warm and dry.     Capillary Refill: Capillary refill takes less than 2 seconds.  Neurological:     Mental Status: She is oriented to person, place, and time.     GCS: GCS eye subscore is 4. GCS verbal subscore is 5. GCS motor subscore is 6.     Comments: Fluent speech, no facial droop.  Psychiatric:        Behavior: Behavior normal.     ED Results / Procedures / Treatments   Labs (all labs ordered are listed, but only abnormal results are displayed) Labs Reviewed  CBC - Abnormal; Notable for the following components:      Result Value   RBC 2.75 (*)    Hemoglobin 7.7 (*)    HCT 26.5 (*)    MCHC 29.1 (*)    RDW 19.2 (*)    Platelets 421 (*)    All other components within normal limits  URINALYSIS, ROUTINE W REFLEX MICROSCOPIC - Abnormal; Notable for the following components:   Leukocytes,Ua MODERATE (*)    All other components within normal limits   IRON AND TIBC - Abnormal; Notable for the following components:   Iron 21 (*)    Saturation Ratios 6 (*)    All other components within normal limits  RETICULOCYTES - Abnormal; Notable for the following components:   Retic Ct Pct 5.0 (*)    RBC. 2.34 (*)    Immature Retic Fract 31.0 (*)    All other components within normal limits  POC OCCULT BLOOD, ED - Abnormal; Notable for the following components:   Fecal Occult Bld POSITIVE (*)    All other components within normal limits  URINE CULTURE  BASIC METABOLIC PANEL  VITAMIN 123456  FOLATE  FERRITIN  CBG MONITORING, ED  TYPE AND SCREEN  PREPARE RBC (CROSSMATCH)  ABO/RH    EKG EKG Interpretation  Date/Time:  Saturday Oct 02 2019 09:39:10 EDT Ventricular Rate:  78 PR Interval:    QRS Duration: 88 QT Interval:  379 QTC Calculation: 432 R Axis:   17 Text Interpretation: Sinus rhythm Probable left atrial enlargement Low voltage, precordial leads RSR' in V1 or V2, right VCD or RVH 12 Lead; Mason-Likar Confirmed by Lacretia Leigh ([id number]) on 10/02/2019 5:18:05 PM   Radiology DG Chest Portable 1 View  Result Date: 10/02/2019 CLINICAL DATA:  Weakness, shortness of breath EXAM: PORTABLE CHEST 1 VIEW COMPARISON:  08/21/2003 FINDINGS: Borderline cardiac enlargement. No focal pneumonia, collapse or consolidation. Negative for edema, effusion or pneumothorax. Trachea midline. Aorta atherosclerotic. Postop changes of the left breast.  Degenerative changes of the spine. IMPRESSION: Borderline cardiomegaly. No acute chest process by plain radiography Aortic Atherosclerosis (ICD10-I70.0). Electronically Signed   By: Jerilynn Mages.  Shick M.D.   On: 10/02/2019 11:10    Procedures .Critical Care Performed by: Cherre Robins, PA-C Authorized by: Cherre Robins, PA-C   Critical care provider statement:    Critical care time (minutes):  34   Critical care time was exclusive of:  Separately billable procedures and treating other patients and  teaching time   Critical care was necessary to treat or prevent imminent or life-threatening deterioration of the following conditions: Symptomatic anemia requiring transfusion.   Critical care was time spent personally by me on the following activities:  Development of treatment plan with patient or surrogate, evaluation of patient's response to treatment, examination of patient, obtaining history from patient or surrogate, ordering and performing treatments and interventions, ordering and review of laboratory studies, ordering and review of radiographic studies, pulse oximetry, re-evaluation of patient's condition and review of old charts   (including critical care time)  Medications Ordered in ED Medications  0.9 %  sodium chloride infusion (0 mL/hr Intravenous Hold 10/02/19 1219)  sodium chloride flush (NS) 0.9 % injection 3 mL (3 mLs Intravenous Given 10/02/19 1207)    ED Course  I have reviewed the triage vital signs and the nursing notes.  Pertinent labs & imaging results that were available during my care of the patient were reviewed by me and considered in my medical decision making (see chart for details).    MDM Rules/Calculators/A&P                      Patient seen and examined. Patient presents awake, alert, hemodynamically stable, afebrile, non toxic.  No tachycardia or hypoxia.  On exam her lungs are clear to auscultation all fields.  She has no abdominal tenderness, no peritoneal signs.  Mucous membranes are moist, no signs of dehydration.  CBG checked on arrival and is 81. Rectal exam performed and shows no gross melena. Fecal occult is positive.  CBC without leukocytosis, hemoglobin is 7.7 which seems comparable with her baseline. BMP without severe electrolyte derangement, no renal insufficiency.  UA without infection.  Chest x-ray viewed by me shows borderline cardiomegaly, no acute infectious findings.  EKG without acute ischemic changes. Type and screen ordered, plan to  transfuse 1 unit as patient has symptomatic anemia. Patient also evaluated by ED attending Dr. Billy Fischer. Lengthy discussion was had with patient regarding admission vs discharge home after blood transfusion. Patient and her son at the bedside are eager to be discharged home. She has appointment scheduled early next week for lab work to check H/H. Given that she is hemodynamically stable, has no signs of hemorrhaging, and established with GI and pcp will discharge home after transfusion.  I assessed patient after transfusion was complete.  She reports feeling much better.  She denies any shortness of breath, chest pain, generalized weakness.  The patient appears reasonably screened and/or stabilized for discharge and I doubt any other medical condition or other [facility] requiring further screening, evaluation, or treatment in the ED at this time prior to discharge. The patient is safe for discharge with strict return precautions discussed. Recommend pcp follow up.    Portions of this note were generated with [employer]. Dictation errors may occur despite best attempts at proofreading.   Final Clinical Impression(s) / ED Diagnoses Final diagnoses:  Iron deficiency anemia, unspecified iron deficiency anemia type  Rx / DC Orders ED Discharge Orders    None       Cherre Robins, PA-C 10/02/19 1800    Cherre Robins, PA-C 10/02/19 1801    Gareth Morgan, MD 10/02/19 2237

## 2019-10-02 NOTE — Discharge Instructions (Addendum)
You have been seen today for shortness of breath. Please read and follow all provided instructions. Return to the emergency room for worsening condition or new concerning symptoms.    1. Medications: Continue usual home medications Take medications as prescribed. Please review all of the medicines and only take them if you do not have an allergy to them.   2. Treatment: rest, drink plenty of fluids  3. Follow Up:  Please follow up with primary care provider by scheduling an appointment as soon as possible for a visit  -Also recommend you keep your follow-up appointment to have your blood work checked on Wednesday.    ?

## 2019-10-03 LAB — TYPE AND SCREEN
ABO/RH(D): A POS
Antibody Screen: NEGATIVE
Unit division: 0

## 2019-10-03 LAB — BPAM RBC
Blood Product Expiration Date: 202106032359
ISSUE DATE / TIME: 202105151512
Unit Type and Rh: 6200

## 2019-10-03 LAB — ABO/RH: ABO/RH(D): A POS

## 2019-10-03 LAB — POC OCCULT BLOOD, ED: Fecal Occult Bld: POSITIVE — AB

## 2019-10-04 ENCOUNTER — Telehealth: Payer: Self-pay | Admitting: *Deleted

## 2019-10-04 LAB — URINE CULTURE

## 2019-10-04 NOTE — Telephone Encounter (Signed)
Would recommend restarting Metoprolol with 1/2 tablet if the following occurs:   1) Heart Rate >100 or feeling palpitations  3) BP >140/90  Ok to hold and discuss at follow-up visit if blood pressure remains normal and she if feeling well.

## 2019-10-04 NOTE — Telephone Encounter (Signed)
Left message for patient to call back  

## 2019-10-04 NOTE — Telephone Encounter (Signed)
Patient returned phone call and I notified her of Dr. Verda Cumins recommendations and she verbalized understanding.

## 2019-10-04 NOTE — Telephone Encounter (Signed)
Patient called stating that she had to go back to the hospital this weekend for another blood transfusion. Patient stated that Dr. Damita Dunnings has been having her take 1/2 of  Metoprolol 50 mg for the past two week. Patient stated that she did not take the Metoprolol Saturday or Sunday because her blood pressure was low and she felt so bad. Patient stated Sunday her blood pressure was 121/74, 111/81 and 124/75. Patient stated that she has not taken the Metoprolol today and her blood pressure is 124/76 and pulse 90. Patient stated that she feels really good today. Patient wants to know if she should start back on the Metoprolol and if so how much should she take?

## 2019-10-05 ENCOUNTER — Other Ambulatory Visit: Payer: Self-pay | Admitting: *Deleted

## 2019-10-05 DIAGNOSIS — D508 Other iron deficiency anemias: Secondary | ICD-10-CM

## 2019-10-06 ENCOUNTER — Telehealth: Payer: Self-pay | Admitting: *Deleted

## 2019-10-06 ENCOUNTER — Inpatient Hospital Stay: Payer: Medicare HMO | Attending: Oncology

## 2019-10-06 ENCOUNTER — Other Ambulatory Visit: Payer: Self-pay | Admitting: *Deleted

## 2019-10-06 ENCOUNTER — Other Ambulatory Visit: Payer: Self-pay

## 2019-10-06 DIAGNOSIS — D508 Other iron deficiency anemias: Secondary | ICD-10-CM

## 2019-10-06 DIAGNOSIS — R195 Other fecal abnormalities: Secondary | ICD-10-CM

## 2019-10-06 DIAGNOSIS — Z853 Personal history of malignant neoplasm of breast: Secondary | ICD-10-CM | POA: Insufficient documentation

## 2019-10-06 DIAGNOSIS — D500 Iron deficiency anemia secondary to blood loss (chronic): Secondary | ICD-10-CM

## 2019-10-06 DIAGNOSIS — K5790 Diverticulosis of intestine, part unspecified, without perforation or abscess without bleeding: Secondary | ICD-10-CM | POA: Insufficient documentation

## 2019-10-06 DIAGNOSIS — D509 Iron deficiency anemia, unspecified: Secondary | ICD-10-CM | POA: Insufficient documentation

## 2019-10-06 DIAGNOSIS — D649 Anemia, unspecified: Secondary | ICD-10-CM

## 2019-10-06 LAB — CBC WITH DIFFERENTIAL (CANCER CENTER ONLY)
Abs Immature Granulocytes: 0.01 10*3/uL (ref 0.00–0.07)
Basophils Absolute: 0 10*3/uL (ref 0.0–0.1)
Basophils Relative: 0 %
Eosinophils Absolute: 0.2 10*3/uL (ref 0.0–0.5)
Eosinophils Relative: 6 %
HCT: 27.1 % — ABNORMAL LOW (ref 36.0–46.0)
Hemoglobin: 8.1 g/dL — ABNORMAL LOW (ref 12.0–15.0)
Immature Granulocytes: 0 %
Lymphocytes Relative: 25 %
Lymphs Abs: 0.8 10*3/uL (ref 0.7–4.0)
MCH: 28.3 pg (ref 26.0–34.0)
MCHC: 29.9 g/dL — ABNORMAL LOW (ref 30.0–36.0)
MCV: 94.8 fL (ref 80.0–100.0)
Monocytes Absolute: 0.4 10*3/uL (ref 0.1–1.0)
Monocytes Relative: 12 %
Neutro Abs: 1.9 10*3/uL (ref 1.7–7.7)
Neutrophils Relative %: 57 %
Platelet Count: 353 10*3/uL (ref 150–400)
RBC: 2.86 MIL/uL — ABNORMAL LOW (ref 3.87–5.11)
RDW: 17.2 % — ABNORMAL HIGH (ref 11.5–15.5)
WBC Count: 3.4 10*3/uL — ABNORMAL LOW (ref 4.0–10.5)
nRBC: 0 % (ref 0.0–0.2)

## 2019-10-06 LAB — IRON AND TIBC
Iron: 21 ug/dL — ABNORMAL LOW (ref 41–142)
Saturation Ratios: 6 % — ABNORMAL LOW (ref 21–57)
TIBC: 338 ug/dL (ref 236–444)
UIBC: 317 ug/dL (ref 120–384)

## 2019-10-06 LAB — RETIC PANEL
Immature Retic Fract: 15.6 % (ref 2.3–15.9)
RBC.: 2.86 MIL/uL — ABNORMAL LOW (ref 3.87–5.11)
Retic Count, Absolute: 123.1 10*3/uL (ref 19.0–186.0)
Retic Ct Pct: 4.4 % — ABNORMAL HIGH (ref 0.4–3.1)
Reticulocyte Hemoglobin: 27.8 pg — ABNORMAL LOW (ref >27.9)

## 2019-10-06 LAB — FERRITIN: Ferritin: 100 ng/mL (ref 11–307)

## 2019-10-06 NOTE — Telephone Encounter (Signed)
Noted iron ( ferritin ) of 100 per lab draw today- per MD no need for IV iron at this time- proceed with 1 unit of blood for symptom management.  Request has been sent to scheduling per above.  Pt called and informed of above and is in agreement of plan.

## 2019-10-06 NOTE — Telephone Encounter (Signed)
This RN spoke with pt post lab today showing heme of 8.1 - ferritin pending. Pt had one unit of blood on 10/02/2019.  She states she is still feeling weak and " just washed out ".  Per discussion she has contacted Dr [facility] office for further evaluation and is scheduled for visit next week due to possible GI  blood loss.  She states her stools range in color and consistency - from dark to brown and loose to diarrhea.  She denies any bright red blood in stool or when she wipes.  Per discussion and review with MD in this office- additional lab orders obtained as well as scheduling pt for blood transfusion this Friday and possible IV iron if needed.  Pt verbalized understanding of above.  Appointment request sent per above.

## 2019-10-07 ENCOUNTER — Telehealth: Payer: Self-pay | Admitting: Oncology

## 2019-10-07 LAB — CEA (IN HOUSE-CHCC): CEA (CHCC-In House): <1 ng/mL (ref 0.00–5.00)

## 2019-10-07 NOTE — Telephone Encounter (Signed)
Scheduled appt per 5/19 sch message - pt is aware of appt date and time

## 2019-10-08 ENCOUNTER — Inpatient Hospital Stay: Payer: Medicare HMO

## 2019-10-08 ENCOUNTER — Other Ambulatory Visit: Payer: Self-pay

## 2019-10-08 DIAGNOSIS — Z853 Personal history of malignant neoplasm of breast: Secondary | ICD-10-CM | POA: Diagnosis not present

## 2019-10-08 DIAGNOSIS — D649 Anemia, unspecified: Secondary | ICD-10-CM

## 2019-10-08 DIAGNOSIS — Z1152 Encounter for screening for COVID-19: Secondary | ICD-10-CM | POA: Diagnosis not present

## 2019-10-08 DIAGNOSIS — D509 Iron deficiency anemia, unspecified: Secondary | ICD-10-CM | POA: Diagnosis not present

## 2019-10-08 DIAGNOSIS — K5790 Diverticulosis of intestine, part unspecified, without perforation or abscess without bleeding: Secondary | ICD-10-CM | POA: Diagnosis not present

## 2019-10-08 LAB — PREPARE RBC (CROSSMATCH)

## 2019-10-08 MED ORDER — ACETAMINOPHEN 325 MG PO TABS
650.0000 mg | ORAL_TABLET | Freq: Once | ORAL | Status: AC
  Administered 2019-10-08: 650 mg via ORAL

## 2019-10-08 MED ORDER — ACETAMINOPHEN 325 MG PO TABS
ORAL_TABLET | ORAL | Status: AC
  Filled 2019-10-08: qty 2

## 2019-10-08 MED ORDER — DIPHENHYDRAMINE HCL 25 MG PO CAPS
ORAL_CAPSULE | ORAL | Status: AC
  Filled 2019-10-08: qty 1

## 2019-10-08 MED ORDER — DIPHENHYDRAMINE HCL 25 MG PO CAPS
25.0000 mg | ORAL_CAPSULE | Freq: Once | ORAL | Status: AC
  Administered 2019-10-08: 25 mg via ORAL

## 2019-10-08 MED ORDER — SODIUM CHLORIDE 0.9% IV SOLUTION
250.0000 mL | Freq: Once | INTRAVENOUS | Status: AC
  Administered 2019-10-08: 250 mL via INTRAVENOUS
  Filled 2019-10-08: qty 250

## 2019-10-08 NOTE — Patient Instructions (Signed)

## 2019-10-09 LAB — TYPE AND SCREEN
ABO/RH(D): A POS
Antibody Screen: NEGATIVE
Unit division: 0

## 2019-10-09 LAB — BPAM RBC
Blood Product Expiration Date: 202106042359
ISSUE DATE / TIME: 202105211057
Unit Type and Rh: 6200

## 2019-10-14 DIAGNOSIS — K31811 Angiodysplasia of stomach and duodenum with bleeding: Secondary | ICD-10-CM | POA: Diagnosis not present

## 2019-10-14 DIAGNOSIS — D509 Iron deficiency anemia, unspecified: Secondary | ICD-10-CM | POA: Diagnosis not present

## 2019-10-14 DIAGNOSIS — K2951 Unspecified chronic gastritis with bleeding: Secondary | ICD-10-CM | POA: Diagnosis not present

## 2019-10-14 DIAGNOSIS — K2950 Unspecified chronic gastritis without bleeding: Secondary | ICD-10-CM | POA: Diagnosis not present

## 2019-10-14 DIAGNOSIS — D500 Iron deficiency anemia secondary to blood loss (chronic): Secondary | ICD-10-CM | POA: Diagnosis not present

## 2019-10-19 ENCOUNTER — Telehealth: Payer: Self-pay | Admitting: *Deleted

## 2019-10-19 ENCOUNTER — Inpatient Hospital Stay: Payer: Medicare HMO | Attending: Oncology

## 2019-10-19 ENCOUNTER — Inpatient Hospital Stay: Payer: Medicare HMO

## 2019-10-19 ENCOUNTER — Other Ambulatory Visit: Payer: Self-pay | Admitting: *Deleted

## 2019-10-19 ENCOUNTER — Other Ambulatory Visit: Payer: Self-pay

## 2019-10-19 DIAGNOSIS — D649 Anemia, unspecified: Secondary | ICD-10-CM

## 2019-10-19 DIAGNOSIS — D508 Other iron deficiency anemias: Secondary | ICD-10-CM

## 2019-10-19 DIAGNOSIS — D509 Iron deficiency anemia, unspecified: Secondary | ICD-10-CM | POA: Diagnosis not present

## 2019-10-19 LAB — CBC WITH DIFFERENTIAL (CANCER CENTER ONLY)
Abs Immature Granulocytes: 0.01 10*3/uL (ref 0.00–0.07)
Basophils Absolute: 0 10*3/uL (ref 0.0–0.1)
Basophils Relative: 0 %
Eosinophils Absolute: 0.2 10*3/uL (ref 0.0–0.5)
Eosinophils Relative: 5 %
HCT: 21.3 % — ABNORMAL LOW (ref 36.0–46.0)
Hemoglobin: 6.4 g/dL — CL (ref 12.0–15.0)
Immature Granulocytes: 0 %
Lymphocytes Relative: 24 %
Lymphs Abs: 0.8 10*3/uL (ref 0.7–4.0)
MCH: 28.7 pg (ref 26.0–34.0)
MCHC: 30 g/dL (ref 30.0–36.0)
MCV: 95.5 fL (ref 80.0–100.0)
Monocytes Absolute: 0.4 10*3/uL (ref 0.1–1.0)
Monocytes Relative: 11 %
Neutro Abs: 2.1 10*3/uL (ref 1.7–7.7)
Neutrophils Relative %: 60 %
Platelet Count: 276 10*3/uL (ref 150–400)
RBC: 2.23 MIL/uL — ABNORMAL LOW (ref 3.87–5.11)
RDW: 16 % — ABNORMAL HIGH (ref 11.5–15.5)
WBC Count: 3.5 10*3/uL — ABNORMAL LOW (ref 4.0–10.5)
nRBC: 0 % (ref 0.0–0.2)

## 2019-10-19 LAB — SAMPLE TO BLOOD BANK

## 2019-10-19 LAB — PREPARE RBC (CROSSMATCH)

## 2019-10-19 MED ORDER — DIPHENHYDRAMINE HCL 25 MG PO CAPS
25.0000 mg | ORAL_CAPSULE | Freq: Once | ORAL | Status: AC
  Administered 2019-10-19: 25 mg via ORAL

## 2019-10-19 MED ORDER — ACETAMINOPHEN 325 MG PO TABS
ORAL_TABLET | ORAL | Status: AC
  Filled 2019-10-19: qty 2

## 2019-10-19 MED ORDER — DIPHENHYDRAMINE HCL 25 MG PO CAPS
ORAL_CAPSULE | ORAL | Status: AC
  Filled 2019-10-19: qty 1

## 2019-10-19 MED ORDER — ACETAMINOPHEN 325 MG PO TABS
650.0000 mg | ORAL_TABLET | Freq: Once | ORAL | Status: AC
  Administered 2019-10-19: 650 mg via ORAL

## 2019-10-19 MED ORDER — SODIUM CHLORIDE 0.9% IV SOLUTION
250.0000 mL | Freq: Once | INTRAVENOUS | Status: AC
  Administered 2019-10-19: 250 mL via INTRAVENOUS
  Filled 2019-10-19: qty 250

## 2019-10-19 NOTE — Telephone Encounter (Signed)
Pt came in for lab check today with noted heme of 6.4.  Per MD review - pt set up for 2 units PRBC's.  Note pt states she was seen at Dr Earlean Shawl who performed a procedure approximately 10 days ago for GI bleed.  She states since having the procedure she has occasional " like a feeling- not really a pain- but a sensation " she points to the fundus and states the sensation radiate out across the diaphragm.  Sensation wakes her up but she is able to return to sleep.  She states her bowels are continuing to be dark " just like they were before I had the procedure"  She states she tried to call MD this AM but was not successful in getting through to a nurse before she had to leave for this office.  This RN spoke with Anderson Malta in Dr [facility] office per above. She will inform Dr Earlean Shawl.  Note pt is being scheduled for weekly labs with transfusion if needed- to see Dr Jannifer Rodney later this month.

## 2019-10-19 NOTE — Patient Instructions (Signed)

## 2019-10-20 ENCOUNTER — Telehealth: Payer: Self-pay | Admitting: Oncology

## 2019-10-20 LAB — TYPE AND SCREEN
ABO/RH(D): A POS
Antibody Screen: NEGATIVE
Unit division: 0
Unit division: 0

## 2019-10-20 LAB — BPAM RBC
Blood Product Expiration Date: 202106062359
Blood Product Expiration Date: 202106072359
ISSUE DATE / TIME: 202106011200
ISSUE DATE / TIME: 202106011200
Unit Type and Rh: 6200
Unit Type and Rh: 6200

## 2019-10-20 NOTE — Telephone Encounter (Signed)
Scheduled appt per 6/1 sch message - unable to reach pt . Unable to leave message - sent message to RN to try to reach pt.

## 2019-10-25 ENCOUNTER — Other Ambulatory Visit: Payer: Self-pay | Admitting: *Deleted

## 2019-10-25 DIAGNOSIS — C50412 Malignant neoplasm of upper-outer quadrant of left female breast: Secondary | ICD-10-CM

## 2019-10-25 DIAGNOSIS — Z170 Estrogen receptor positive status [ER+]: Secondary | ICD-10-CM

## 2019-10-26 ENCOUNTER — Inpatient Hospital Stay: Payer: Medicare HMO

## 2019-10-26 ENCOUNTER — Other Ambulatory Visit: Payer: Self-pay

## 2019-10-26 DIAGNOSIS — D509 Iron deficiency anemia, unspecified: Secondary | ICD-10-CM | POA: Diagnosis not present

## 2019-10-26 DIAGNOSIS — D508 Other iron deficiency anemias: Secondary | ICD-10-CM

## 2019-10-26 LAB — CBC WITH DIFFERENTIAL (CANCER CENTER ONLY)
Abs Immature Granulocytes: 0.01 10*3/uL (ref 0.00–0.07)
Basophils Absolute: 0 10*3/uL (ref 0.0–0.1)
Basophils Relative: 0 %
Eosinophils Absolute: 0.2 10*3/uL (ref 0.0–0.5)
Eosinophils Relative: 5 %
HCT: 29.7 % — ABNORMAL LOW (ref 36.0–46.0)
Hemoglobin: 9 g/dL — ABNORMAL LOW (ref 12.0–15.0)
Immature Granulocytes: 0 %
Lymphocytes Relative: 20 %
Lymphs Abs: 0.9 10*3/uL (ref 0.7–4.0)
MCH: 28.7 pg (ref 26.0–34.0)
MCHC: 30.3 g/dL (ref 30.0–36.0)
MCV: 94.6 fL (ref 80.0–100.0)
Monocytes Absolute: 0.5 10*3/uL (ref 0.1–1.0)
Monocytes Relative: 12 %
Neutro Abs: 2.8 10*3/uL (ref 1.7–7.7)
Neutrophils Relative %: 63 %
Platelet Count: 295 10*3/uL (ref 150–400)
RBC: 3.14 MIL/uL — ABNORMAL LOW (ref 3.87–5.11)
RDW: 14.5 % (ref 11.5–15.5)
WBC Count: 4.4 10*3/uL (ref 4.0–10.5)
nRBC: 0 % (ref 0.0–0.2)

## 2019-10-26 LAB — FERRITIN: Ferritin: 27 ng/mL (ref 11–307)

## 2019-10-26 LAB — SAMPLE TO BLOOD BANK

## 2019-10-26 NOTE — Progress Notes (Signed)
Patient aware of no need for blood today and given a copy of labs.

## 2019-10-26 NOTE — Progress Notes (Signed)
No transfusion needed today per MD Magrinat

## 2019-11-01 ENCOUNTER — Inpatient Hospital Stay (HOSPITAL_BASED_OUTPATIENT_CLINIC_OR_DEPARTMENT_OTHER): Payer: Medicare HMO | Admitting: Medical

## 2019-11-01 ENCOUNTER — Inpatient Hospital Stay: Payer: Medicare HMO

## 2019-11-01 ENCOUNTER — Other Ambulatory Visit: Payer: Self-pay | Admitting: *Deleted

## 2019-11-01 ENCOUNTER — Other Ambulatory Visit: Payer: Self-pay

## 2019-11-01 DIAGNOSIS — Z170 Estrogen receptor positive status [ER+]: Secondary | ICD-10-CM

## 2019-11-01 DIAGNOSIS — D500 Iron deficiency anemia secondary to blood loss (chronic): Secondary | ICD-10-CM

## 2019-11-01 DIAGNOSIS — C50412 Malignant neoplasm of upper-outer quadrant of left female breast: Secondary | ICD-10-CM

## 2019-11-01 DIAGNOSIS — D509 Iron deficiency anemia, unspecified: Secondary | ICD-10-CM | POA: Diagnosis not present

## 2019-11-01 LAB — CBC WITH DIFFERENTIAL (CANCER CENTER ONLY)
Abs Immature Granulocytes: 0.01 10*3/uL (ref 0.00–0.07)
Basophils Absolute: 0 10*3/uL (ref 0.0–0.1)
Basophils Relative: 0 %
Eosinophils Absolute: 0.2 10*3/uL (ref 0.0–0.5)
Eosinophils Relative: 4 %
HCT: 22.8 % — ABNORMAL LOW (ref 36.0–46.0)
Hemoglobin: 6.8 g/dL — CL (ref 12.0–15.0)
Immature Granulocytes: 0 %
Lymphocytes Relative: 17 %
Lymphs Abs: 0.8 10*3/uL (ref 0.7–4.0)
MCH: 28.3 pg (ref 26.0–34.0)
MCHC: 29.8 g/dL — ABNORMAL LOW (ref 30.0–36.0)
MCV: 95 fL (ref 80.0–100.0)
Monocytes Absolute: 0.6 10*3/uL (ref 0.1–1.0)
Monocytes Relative: 13 %
Neutro Abs: 3 10*3/uL (ref 1.7–7.7)
Neutrophils Relative %: 66 %
Platelet Count: 284 10*3/uL (ref 150–400)
RBC: 2.4 MIL/uL — ABNORMAL LOW (ref 3.87–5.11)
RDW: 14.6 % (ref 11.5–15.5)
WBC Count: 4.6 10*3/uL (ref 4.0–10.5)
nRBC: 0 % (ref 0.0–0.2)

## 2019-11-01 LAB — SAMPLE TO BLOOD BANK

## 2019-11-01 LAB — PREPARE RBC (CROSSMATCH)

## 2019-11-01 MED ORDER — SODIUM CHLORIDE 0.9% IV SOLUTION
250.0000 mL | Freq: Once | INTRAVENOUS | Status: AC
  Administered 2019-11-01: 250 mL via INTRAVENOUS
  Filled 2019-11-01: qty 250

## 2019-11-01 MED ORDER — DIPHENHYDRAMINE HCL 25 MG PO CAPS
ORAL_CAPSULE | ORAL | Status: AC
  Filled 2019-11-01: qty 1

## 2019-11-01 MED ORDER — ACETAMINOPHEN 325 MG PO TABS
650.0000 mg | ORAL_TABLET | Freq: Once | ORAL | Status: AC
  Administered 2019-11-01: 650 mg via ORAL

## 2019-11-01 MED ORDER — DIPHENHYDRAMINE HCL 25 MG PO CAPS
25.0000 mg | ORAL_CAPSULE | Freq: Once | ORAL | Status: AC
  Administered 2019-11-01: 25 mg via ORAL

## 2019-11-01 MED ORDER — ACETAMINOPHEN 325 MG PO TABS
ORAL_TABLET | ORAL | Status: AC
  Filled 2019-11-01: qty 2

## 2019-11-01 NOTE — Patient Instructions (Signed)

## 2019-11-02 ENCOUNTER — Inpatient Hospital Stay: Payer: Medicare HMO

## 2019-11-02 ENCOUNTER — Inpatient Hospital Stay: Payer: Medicare HMO | Admitting: Adult Health

## 2019-11-02 LAB — TYPE AND SCREEN
ABO/RH(D): A POS
Antibody Screen: NEGATIVE
Unit division: 0
Unit division: 0

## 2019-11-02 LAB — BPAM RBC
Blood Product Expiration Date: 202106292359
Blood Product Expiration Date: 202106292359
ISSUE DATE / TIME: 202106141224
ISSUE DATE / TIME: 202106141408
Unit Type and Rh: 6200
Unit Type and Rh: 6200

## 2019-11-02 NOTE — Progress Notes (Signed)
The patient was seen in infusion today. The patient was here to be transfused as her was 6.8. She and her son denied any issues of concern.  Sandi Mealy, MHS, PA-C Physician Assistant

## 2019-11-09 ENCOUNTER — Inpatient Hospital Stay: Payer: Medicare HMO

## 2019-11-09 DIAGNOSIS — D649 Anemia, unspecified: Secondary | ICD-10-CM | POA: Diagnosis not present

## 2019-11-09 DIAGNOSIS — K31811 Angiodysplasia of stomach and duodenum with bleeding: Secondary | ICD-10-CM | POA: Diagnosis not present

## 2019-11-15 ENCOUNTER — Other Ambulatory Visit: Payer: Self-pay | Admitting: *Deleted

## 2019-11-15 ENCOUNTER — Other Ambulatory Visit: Payer: Self-pay

## 2019-11-15 ENCOUNTER — Inpatient Hospital Stay: Payer: Medicare HMO

## 2019-11-15 ENCOUNTER — Telehealth: Payer: Self-pay | Admitting: Oncology

## 2019-11-15 DIAGNOSIS — D508 Other iron deficiency anemias: Secondary | ICD-10-CM

## 2019-11-15 DIAGNOSIS — D649 Anemia, unspecified: Secondary | ICD-10-CM

## 2019-11-15 DIAGNOSIS — D500 Iron deficiency anemia secondary to blood loss (chronic): Secondary | ICD-10-CM

## 2019-11-15 DIAGNOSIS — D509 Iron deficiency anemia, unspecified: Secondary | ICD-10-CM | POA: Diagnosis not present

## 2019-11-15 LAB — CBC WITH DIFFERENTIAL (CANCER CENTER ONLY)
Abs Immature Granulocytes: 0.01 10*3/uL (ref 0.00–0.07)
Basophils Absolute: 0 10*3/uL (ref 0.0–0.1)
Basophils Relative: 0 %
Eosinophils Absolute: 0.1 10*3/uL (ref 0.0–0.5)
Eosinophils Relative: 4 %
HCT: 27.1 % — ABNORMAL LOW (ref 36.0–46.0)
Hemoglobin: 8.2 g/dL — ABNORMAL LOW (ref 12.0–15.0)
Immature Granulocytes: 0 %
Lymphocytes Relative: 19 %
Lymphs Abs: 0.6 10*3/uL — ABNORMAL LOW (ref 0.7–4.0)
MCH: 28.6 pg (ref 26.0–34.0)
MCHC: 30.3 g/dL (ref 30.0–36.0)
MCV: 94.4 fL (ref 80.0–100.0)
Monocytes Absolute: 0.5 10*3/uL (ref 0.1–1.0)
Monocytes Relative: 17 %
Neutro Abs: 1.9 10*3/uL (ref 1.7–7.7)
Neutrophils Relative %: 60 %
Platelet Count: 312 10*3/uL (ref 150–400)
RBC: 2.87 MIL/uL — ABNORMAL LOW (ref 3.87–5.11)
RDW: 14.6 % (ref 11.5–15.5)
WBC Count: 3.1 10*3/uL — ABNORMAL LOW (ref 4.0–10.5)
nRBC: 0 % (ref 0.0–0.2)

## 2019-11-15 LAB — PREPARE RBC (CROSSMATCH)

## 2019-11-15 LAB — FERRITIN: Ferritin: 15 ng/mL (ref 11–307)

## 2019-11-15 LAB — SAMPLE TO BLOOD BANK

## 2019-11-15 NOTE — Telephone Encounter (Signed)
Scheduled per sch msg. Called and spoke with patient. Confirmed appt  

## 2019-11-16 ENCOUNTER — Other Ambulatory Visit: Payer: Self-pay | Admitting: *Deleted

## 2019-11-16 ENCOUNTER — Inpatient Hospital Stay: Payer: Medicare HMO

## 2019-11-16 ENCOUNTER — Other Ambulatory Visit: Payer: Self-pay

## 2019-11-16 DIAGNOSIS — D500 Iron deficiency anemia secondary to blood loss (chronic): Secondary | ICD-10-CM

## 2019-11-16 DIAGNOSIS — D649 Anemia, unspecified: Secondary | ICD-10-CM

## 2019-11-16 DIAGNOSIS — D509 Iron deficiency anemia, unspecified: Secondary | ICD-10-CM | POA: Diagnosis not present

## 2019-11-16 MED ORDER — ACETAMINOPHEN 325 MG PO TABS
650.0000 mg | ORAL_TABLET | Freq: Once | ORAL | Status: AC
  Administered 2019-11-16: 650 mg via ORAL

## 2019-11-16 MED ORDER — DIPHENHYDRAMINE HCL 25 MG PO CAPS
25.0000 mg | ORAL_CAPSULE | Freq: Once | ORAL | Status: AC
  Administered 2019-11-16: 25 mg via ORAL

## 2019-11-16 MED ORDER — SODIUM CHLORIDE 0.9% IV SOLUTION
250.0000 mL | Freq: Once | INTRAVENOUS | Status: AC
  Administered 2019-11-16: 250 mL via INTRAVENOUS
  Filled 2019-11-16: qty 250

## 2019-11-16 MED ORDER — DIPHENHYDRAMINE HCL 25 MG PO CAPS
ORAL_CAPSULE | ORAL | Status: AC
  Filled 2019-11-16: qty 1

## 2019-11-16 MED ORDER — ACETAMINOPHEN 325 MG PO TABS
ORAL_TABLET | ORAL | Status: AC
  Filled 2019-11-16: qty 2

## 2019-11-16 NOTE — Patient Instructions (Signed)

## 2019-11-17 LAB — BPAM RBC
Blood Product Expiration Date: 202107092359
Blood Product Expiration Date: 202107092359
ISSUE DATE / TIME: 202106291224
ISSUE DATE / TIME: 202106291224
Unit Type and Rh: 6200
Unit Type and Rh: 6200

## 2019-11-17 LAB — TYPE AND SCREEN
ABO/RH(D): A POS
Antibody Screen: NEGATIVE
Unit division: 0
Unit division: 0

## 2019-12-01 ENCOUNTER — Telehealth: Payer: Self-pay | Admitting: *Deleted

## 2019-12-01 ENCOUNTER — Telehealth: Payer: Self-pay | Admitting: Oncology

## 2019-12-01 DIAGNOSIS — D500 Iron deficiency anemia secondary to blood loss (chronic): Secondary | ICD-10-CM

## 2019-12-01 NOTE — Telephone Encounter (Signed)
Scheduled appt per 7/14 sch msg - pt is aware of appts added.

## 2019-12-01 NOTE — Telephone Encounter (Signed)
Pt called requesting to have labs drawn and possible transfusion. Feels like Hgb might be low. Has procedure next Tuesday at [facility].

## 2019-12-01 NOTE — Telephone Encounter (Signed)
Message to scheduler. Labs on Friday with possible 2 units on Saturday or Monday.

## 2019-12-03 ENCOUNTER — Other Ambulatory Visit: Payer: Self-pay | Admitting: Oncology

## 2019-12-03 ENCOUNTER — Other Ambulatory Visit: Payer: Self-pay

## 2019-12-03 ENCOUNTER — Inpatient Hospital Stay: Payer: Medicare HMO | Attending: Oncology

## 2019-12-03 DIAGNOSIS — C50412 Malignant neoplasm of upper-outer quadrant of left female breast: Secondary | ICD-10-CM | POA: Diagnosis not present

## 2019-12-03 DIAGNOSIS — C50919 Malignant neoplasm of unspecified site of unspecified female breast: Secondary | ICD-10-CM | POA: Insufficient documentation

## 2019-12-03 DIAGNOSIS — D500 Iron deficiency anemia secondary to blood loss (chronic): Secondary | ICD-10-CM

## 2019-12-03 DIAGNOSIS — Z170 Estrogen receptor positive status [ER+]: Secondary | ICD-10-CM | POA: Insufficient documentation

## 2019-12-03 DIAGNOSIS — D509 Iron deficiency anemia, unspecified: Secondary | ICD-10-CM | POA: Diagnosis not present

## 2019-12-03 DIAGNOSIS — N8110 Cystocele, unspecified: Secondary | ICD-10-CM | POA: Insufficient documentation

## 2019-12-03 DIAGNOSIS — M8580 Other specified disorders of bone density and structure, unspecified site: Secondary | ICD-10-CM | POA: Insufficient documentation

## 2019-12-03 DIAGNOSIS — N904 Leukoplakia of vulva: Secondary | ICD-10-CM | POA: Insufficient documentation

## 2019-12-03 DIAGNOSIS — R319 Hematuria, unspecified: Secondary | ICD-10-CM | POA: Insufficient documentation

## 2019-12-03 LAB — CBC WITH DIFFERENTIAL (CANCER CENTER ONLY)
Abs Immature Granulocytes: 0.01 10*3/uL (ref 0.00–0.07)
Basophils Absolute: 0 10*3/uL (ref 0.0–0.1)
Basophils Relative: 0 %
Eosinophils Absolute: 0.2 10*3/uL (ref 0.0–0.5)
Eosinophils Relative: 4 %
HCT: 30.7 % — ABNORMAL LOW (ref 36.0–46.0)
Hemoglobin: 9.5 g/dL — ABNORMAL LOW (ref 12.0–15.0)
Immature Granulocytes: 0 %
Lymphocytes Relative: 18 %
Lymphs Abs: 0.9 10*3/uL (ref 0.7–4.0)
MCH: 28.2 pg (ref 26.0–34.0)
MCHC: 30.9 g/dL (ref 30.0–36.0)
MCV: 91.1 fL (ref 80.0–100.0)
Monocytes Absolute: 0.6 10*3/uL (ref 0.1–1.0)
Monocytes Relative: 13 %
Neutro Abs: 3.1 10*3/uL (ref 1.7–7.7)
Neutrophils Relative %: 65 %
Platelet Count: 283 10*3/uL (ref 150–400)
RBC: 3.37 MIL/uL — ABNORMAL LOW (ref 3.87–5.11)
RDW: 15.9 % — ABNORMAL HIGH (ref 11.5–15.5)
WBC Count: 4.8 10*3/uL (ref 4.0–10.5)
nRBC: 0 % (ref 0.0–0.2)

## 2019-12-03 LAB — SAMPLE TO BLOOD BANK

## 2019-12-03 NOTE — Progress Notes (Signed)
Kristi Carr remains anemic despite progress with sclerosing her bleeding lesions. She will benefit from a second round of venofer. She will call us to let us know what days to schedule thuis

## 2019-12-04 ENCOUNTER — Inpatient Hospital Stay: Payer: Medicare HMO

## 2019-12-07 DIAGNOSIS — K31819 Angiodysplasia of stomach and duodenum without bleeding: Secondary | ICD-10-CM | POA: Diagnosis not present

## 2019-12-07 DIAGNOSIS — K31811 Angiodysplasia of stomach and duodenum with bleeding: Secondary | ICD-10-CM | POA: Diagnosis not present

## 2019-12-08 ENCOUNTER — Other Ambulatory Visit: Payer: Self-pay | Admitting: Family Medicine

## 2019-12-08 DIAGNOSIS — E559 Vitamin D deficiency, unspecified: Secondary | ICD-10-CM

## 2019-12-08 DIAGNOSIS — I10 Essential (primary) hypertension: Secondary | ICD-10-CM

## 2019-12-08 DIAGNOSIS — D509 Iron deficiency anemia, unspecified: Secondary | ICD-10-CM

## 2019-12-09 ENCOUNTER — Other Ambulatory Visit: Payer: Self-pay | Admitting: Oncology

## 2019-12-09 ENCOUNTER — Telehealth: Payer: Self-pay | Admitting: *Deleted

## 2019-12-09 ENCOUNTER — Telehealth: Payer: Self-pay | Admitting: Oncology

## 2019-12-09 NOTE — Telephone Encounter (Signed)
Scheduled apt per 7/22 sch msg - pt is aware of appts scheduled.

## 2019-12-09 NOTE — Telephone Encounter (Signed)
Pt called requesting appt to start IV iron which was noted by Dr.Magrinat. Pt stated, "I called last Friday and Monday to get scheduled but no one has called me back." Scheduling message was sent to schedule IV iron infusions for 5 weeks per Dr. Jana Hakim

## 2019-12-13 ENCOUNTER — Other Ambulatory Visit: Payer: Self-pay | Admitting: *Deleted

## 2019-12-13 DIAGNOSIS — D508 Other iron deficiency anemias: Secondary | ICD-10-CM

## 2019-12-13 DIAGNOSIS — D500 Iron deficiency anemia secondary to blood loss (chronic): Secondary | ICD-10-CM

## 2019-12-14 ENCOUNTER — Inpatient Hospital Stay: Payer: Medicare HMO

## 2019-12-14 ENCOUNTER — Other Ambulatory Visit: Payer: Self-pay

## 2019-12-14 VITALS — BP 151/69 | HR 66 | Temp 98.5°F | Resp 18

## 2019-12-14 DIAGNOSIS — Z170 Estrogen receptor positive status [ER+]: Secondary | ICD-10-CM | POA: Diagnosis not present

## 2019-12-14 DIAGNOSIS — D500 Iron deficiency anemia secondary to blood loss (chronic): Secondary | ICD-10-CM

## 2019-12-14 DIAGNOSIS — D508 Other iron deficiency anemias: Secondary | ICD-10-CM

## 2019-12-14 DIAGNOSIS — D509 Iron deficiency anemia, unspecified: Secondary | ICD-10-CM | POA: Diagnosis not present

## 2019-12-14 DIAGNOSIS — C50412 Malignant neoplasm of upper-outer quadrant of left female breast: Secondary | ICD-10-CM | POA: Diagnosis not present

## 2019-12-14 LAB — CBC WITH DIFFERENTIAL (CANCER CENTER ONLY)
Abs Immature Granulocytes: 0.01 10*3/uL (ref 0.00–0.07)
Basophils Absolute: 0 10*3/uL (ref 0.0–0.1)
Basophils Relative: 0 %
Eosinophils Absolute: 0.2 10*3/uL (ref 0.0–0.5)
Eosinophils Relative: 4 %
HCT: 29.2 % — ABNORMAL LOW (ref 36.0–46.0)
Hemoglobin: 9 g/dL — ABNORMAL LOW (ref 12.0–15.0)
Immature Granulocytes: 0 %
Lymphocytes Relative: 16 %
Lymphs Abs: 0.8 10*3/uL (ref 0.7–4.0)
MCH: 28.7 pg (ref 26.0–34.0)
MCHC: 30.8 g/dL (ref 30.0–36.0)
MCV: 93 fL (ref 80.0–100.0)
Monocytes Absolute: 0.6 10*3/uL (ref 0.1–1.0)
Monocytes Relative: 13 %
Neutro Abs: 3.3 10*3/uL (ref 1.7–7.7)
Neutrophils Relative %: 67 %
Platelet Count: 303 10*3/uL (ref 150–400)
RBC: 3.14 MIL/uL — ABNORMAL LOW (ref 3.87–5.11)
RDW: 16.3 % — ABNORMAL HIGH (ref 11.5–15.5)
WBC Count: 5 10*3/uL (ref 4.0–10.5)
nRBC: 0 % (ref 0.0–0.2)

## 2019-12-14 LAB — SAMPLE TO BLOOD BANK

## 2019-12-14 MED ORDER — SODIUM CHLORIDE 0.9 % IV SOLN
200.0000 mg | Freq: Once | INTRAVENOUS | Status: AC
  Administered 2019-12-14: 200 mg via INTRAVENOUS
  Filled 2019-12-14: qty 200

## 2019-12-14 MED ORDER — SODIUM CHLORIDE 0.9 % IV SOLN
Freq: Once | INTRAVENOUS | Status: AC
  Filled 2019-12-14: qty 250

## 2019-12-14 NOTE — Patient Instructions (Signed)

## 2019-12-16 ENCOUNTER — Other Ambulatory Visit: Payer: Self-pay

## 2019-12-16 ENCOUNTER — Other Ambulatory Visit (INDEPENDENT_AMBULATORY_CARE_PROVIDER_SITE_OTHER): Payer: Medicare HMO

## 2019-12-16 DIAGNOSIS — D509 Iron deficiency anemia, unspecified: Secondary | ICD-10-CM | POA: Diagnosis not present

## 2019-12-16 DIAGNOSIS — E559 Vitamin D deficiency, unspecified: Secondary | ICD-10-CM

## 2019-12-16 DIAGNOSIS — I10 Essential (primary) hypertension: Secondary | ICD-10-CM | POA: Diagnosis not present

## 2019-12-16 LAB — CBC WITH DIFFERENTIAL/PLATELET
Basophils Absolute: 0 10*3/uL (ref 0.0–0.1)
Basophils Relative: 0.3 % (ref 0.0–3.0)
Eosinophils Absolute: 0.1 10*3/uL (ref 0.0–0.7)
Eosinophils Relative: 2.9 % (ref 0.0–5.0)
HCT: 29.2 % — ABNORMAL LOW (ref 36.0–46.0)
Hemoglobin: 9.7 g/dL — ABNORMAL LOW (ref 12.0–15.0)
Lymphocytes Relative: 13.8 % (ref 12.0–46.0)
Lymphs Abs: 0.7 10*3/uL (ref 0.7–4.0)
MCHC: 33.1 g/dL (ref 30.0–36.0)
MCV: 88.1 fl (ref 78.0–100.0)
Monocytes Absolute: 0.6 10*3/uL (ref 0.1–1.0)
Monocytes Relative: 11.4 % (ref 3.0–12.0)
Neutro Abs: 3.6 10*3/uL (ref 1.4–7.7)
Neutrophils Relative %: 71.6 % (ref 43.0–77.0)
Platelets: 330 10*3/uL (ref 150.0–400.0)
RBC: 3.32 Mil/uL — ABNORMAL LOW (ref 3.87–5.11)
RDW: 17.5 % — ABNORMAL HIGH (ref 11.5–15.5)
WBC: 5 10*3/uL (ref 4.0–10.5)

## 2019-12-16 LAB — COMPREHENSIVE METABOLIC PANEL
ALT: 12 U/L (ref 0–35)
AST: 18 U/L (ref 0–37)
Albumin: 3.8 g/dL (ref 3.5–5.2)
Alkaline Phosphatase: 61 U/L (ref 39–117)
BUN: 12 mg/dL (ref 6–23)
CO2: 32 mEq/L (ref 19–32)
Calcium: 9.4 mg/dL (ref 8.4–10.5)
Chloride: 104 mEq/L (ref 96–112)
Creatinine, Ser: 0.75 mg/dL (ref 0.40–1.20)
GFR: 75.7 mL/min (ref >60.00)
Glucose, Bld: 94 mg/dL (ref 70–99)
Potassium: 3.8 mEq/L (ref 3.5–5.1)
Sodium: 141 mEq/L (ref 135–145)
Total Bilirubin: 0.5 mg/dL (ref 0.2–1.2)
Total Protein: 6.7 g/dL (ref 6.0–8.3)

## 2019-12-16 LAB — LIPID PANEL
Cholesterol: 135 mg/dL (ref 0–200)
HDL: 43.1 mg/dL (ref >39.00)
LDL Cholesterol: 68 mg/dL (ref 0–99)
NonHDL: 92.09
Total CHOL/HDL Ratio: 3
Triglycerides: 120 mg/dL (ref 0.0–149.0)
VLDL: 24 mg/dL (ref 0.0–40.0)

## 2019-12-16 LAB — IRON: Iron: 35 ug/dL — ABNORMAL LOW (ref 42–145)

## 2019-12-16 LAB — VITAMIN D 25 HYDROXY (VIT D DEFICIENCY, FRACTURES): VITD: 38.05 ng/mL (ref 30.00–100.00)

## 2019-12-20 ENCOUNTER — Encounter: Payer: Medicare HMO | Admitting: Family Medicine

## 2019-12-21 ENCOUNTER — Inpatient Hospital Stay: Payer: Medicare HMO | Attending: Oncology

## 2019-12-21 ENCOUNTER — Other Ambulatory Visit: Payer: Self-pay

## 2019-12-21 VITALS — BP 135/61 | HR 66 | Temp 98.2°F | Resp 18 | Ht 65.0 in | Wt 115.5 lb

## 2019-12-21 DIAGNOSIS — D509 Iron deficiency anemia, unspecified: Secondary | ICD-10-CM | POA: Insufficient documentation

## 2019-12-21 DIAGNOSIS — D508 Other iron deficiency anemias: Secondary | ICD-10-CM

## 2019-12-21 MED ORDER — SODIUM CHLORIDE 0.9 % IV SOLN
INTRAVENOUS | Status: DC
  Filled 2019-12-21: qty 250

## 2019-12-21 MED ORDER — SODIUM CHLORIDE 0.9 % IV SOLN
200.0000 mg | Freq: Once | INTRAVENOUS | Status: AC
  Administered 2019-12-21: 200 mg via INTRAVENOUS
  Filled 2019-12-21: qty 200

## 2019-12-21 NOTE — Patient Instructions (Signed)

## 2019-12-28 ENCOUNTER — Ambulatory Visit (INDEPENDENT_AMBULATORY_CARE_PROVIDER_SITE_OTHER): Payer: Medicare HMO | Admitting: Family Medicine

## 2019-12-28 ENCOUNTER — Encounter: Payer: Self-pay | Admitting: Family Medicine

## 2019-12-28 ENCOUNTER — Other Ambulatory Visit: Payer: Self-pay

## 2019-12-28 ENCOUNTER — Inpatient Hospital Stay: Payer: Medicare HMO

## 2019-12-28 VITALS — BP 123/58 | HR 62 | Temp 98.5°F | Resp 16

## 2019-12-28 VITALS — BP 150/80 | HR 83 | Temp 97.1°F | Ht 64.25 in | Wt 115.4 lb

## 2019-12-28 DIAGNOSIS — D508 Other iron deficiency anemias: Secondary | ICD-10-CM

## 2019-12-28 DIAGNOSIS — I10 Essential (primary) hypertension: Secondary | ICD-10-CM

## 2019-12-28 DIAGNOSIS — Z7189 Other specified counseling: Secondary | ICD-10-CM

## 2019-12-28 DIAGNOSIS — Z0000 Encounter for general adult medical examination without abnormal findings: Secondary | ICD-10-CM | POA: Diagnosis not present

## 2019-12-28 DIAGNOSIS — D509 Iron deficiency anemia, unspecified: Secondary | ICD-10-CM | POA: Diagnosis not present

## 2019-12-28 MED ORDER — SODIUM CHLORIDE 0.9 % IV SOLN
INTRAVENOUS | Status: DC
  Filled 2019-12-28: qty 250

## 2019-12-28 MED ORDER — SODIUM CHLORIDE 0.9 % IV SOLN
200.0000 mg | Freq: Once | INTRAVENOUS | Status: AC
  Administered 2019-12-28: 200 mg via INTRAVENOUS
  Filled 2019-12-28: qty 200

## 2019-12-28 NOTE — Patient Instructions (Signed)

## 2019-12-28 NOTE — Progress Notes (Signed)
This visit occurred during the SARS-CoV-2 public health emergency.  Safety protocols were in place, including screening questions prior to the visit, additional usage of staff PPE, and extensive cleaning of exam room while observing appropriate contact time as indicated for disinfecting solutions.  I have personally reviewed the Medicare Annual Wellness questionnaire and have noted 1. The patient's medical and social history 2. Their use of alcohol, tobacco or illicit drugs 3. Their current medications and supplements 4. The patient's functional ability including ADL's, fall risks, home safety risks and hearing or visual             impairment. 5. Diet and physical activities 6. Evidence for depression or mood disorders  The patients weight, height, BMI have been recorded in the chart and visual acuity is per eye clinic.  I have made referrals, counseling and provided education to the patient based review of the above and I have provided the pt with a written personalized care plan for preventive services.  Provider list updated- see scanned forms.  Routine anticipatory guidance given to patient.  See health maintenance. The possibility exists that previously documented standard health maintenance information may have been brought forward from a previous encounter into this note.  If needed, that same information has been updated to reflect the current situation based on today's encounter.    Flu yearly. Shingles previously done PNA up-to-date Tetanus 2012 covid 2021 Colonoscopy 2020 Breast cancer screening 2021 DXA options discussed with patient. Advance directive-husband and both kids equally designated if patient were incapacitated. Cognitive function addressed- see scanned forms- and if abnormal then additional documentation follows.   Anemia.  She is taking infusions but not iron by mouth.  Still on MVI.  Still on PPI at baseline.  She has GI and heme f/u pending.  She is happy seeing  [facility].  We can refer locally in the future if needed, d/w pt.  Questran used prn.  She has less blood in stool now after GI tx.   She had RFA with Dr.Conway on 12/07/19.  No CP.    BP elevation here.  140/80 at home.  She has likely white coat component.  She is on 50mg  metoprolol.  Compliant.  No ADE on med.  She is helping her husband how has memory loss.  He isn't driving.   PMH and SH reviewed  Meds, vitals, and allergies reviewed.   ROS: Per HPI.  Unless specifically indicated otherwise in HPI, the patient denies:  General: fever. Eyes: acute vision changes ENT: sore throat Cardiovascular: chest pain Respiratory: SOB GI: vomiting GU: dysuria Musculoskeletal: acute back pain Derm: acute rash Neuro: acute motor dysfunction Psych: worsening mood Endocrine: polydipsia Heme: bleeding Allergy: hayfever  GEN: nad, alert and oriented HEENT: ncat NECK: supple w/o LA CV: rrr. PULM: ctab, no inc wob ABD: soft, +bs EXT: no edema SKIN: no acute rash

## 2019-12-28 NOTE — Patient Instructions (Signed)
Don't change your meds for now.  Update me as needed.   I'll await the notes from the blood clinic and the GI clinic.  We'll go from there.  Take care.  Glad to see you.

## 2019-12-30 ENCOUNTER — Other Ambulatory Visit: Payer: Self-pay | Admitting: *Deleted

## 2019-12-30 DIAGNOSIS — D500 Iron deficiency anemia secondary to blood loss (chronic): Secondary | ICD-10-CM

## 2019-12-31 ENCOUNTER — Other Ambulatory Visit: Payer: Self-pay | Admitting: *Deleted

## 2019-12-31 ENCOUNTER — Telehealth: Payer: Self-pay | Admitting: *Deleted

## 2019-12-31 DIAGNOSIS — D508 Other iron deficiency anemias: Secondary | ICD-10-CM

## 2019-12-31 NOTE — Telephone Encounter (Signed)
Pt called asking for lab on Monday when she comes in to obtain IV iron. She states she is going to [place] on Tuesday for a procedure " and just want to know what my labs are before I go "  She states overall she feels stable with some mild BP changes.  Lab appointment and orders added to schedule on 8/16.

## 2020-01-03 ENCOUNTER — Inpatient Hospital Stay: Payer: Medicare HMO

## 2020-01-03 ENCOUNTER — Other Ambulatory Visit: Payer: Self-pay

## 2020-01-03 VITALS — BP 137/61 | HR 64 | Temp 98.2°F | Resp 16

## 2020-01-03 DIAGNOSIS — D508 Other iron deficiency anemias: Secondary | ICD-10-CM

## 2020-01-03 DIAGNOSIS — D509 Iron deficiency anemia, unspecified: Secondary | ICD-10-CM | POA: Diagnosis not present

## 2020-01-03 LAB — CBC WITH DIFFERENTIAL (CANCER CENTER ONLY)
Abs Immature Granulocytes: 0.01 10*3/uL (ref 0.00–0.07)
Basophils Absolute: 0 10*3/uL (ref 0.0–0.1)
Basophils Relative: 0 %
Eosinophils Absolute: 0.2 10*3/uL (ref 0.0–0.5)
Eosinophils Relative: 4 %
HCT: 31.6 % — ABNORMAL LOW (ref 36.0–46.0)
Hemoglobin: 9.6 g/dL — ABNORMAL LOW (ref 12.0–15.0)
Immature Granulocytes: 0 %
Lymphocytes Relative: 14 %
Lymphs Abs: 0.6 10*3/uL — ABNORMAL LOW (ref 0.7–4.0)
MCH: 28.8 pg (ref 26.0–34.0)
MCHC: 30.4 g/dL (ref 30.0–36.0)
MCV: 94.9 fL (ref 80.0–100.0)
Monocytes Absolute: 0.5 10*3/uL (ref 0.1–1.0)
Monocytes Relative: 12 %
Neutro Abs: 3.1 10*3/uL (ref 1.7–7.7)
Neutrophils Relative %: 70 %
Platelet Count: 270 10*3/uL (ref 150–400)
RBC: 3.33 MIL/uL — ABNORMAL LOW (ref 3.87–5.11)
RDW: 16.9 % — ABNORMAL HIGH (ref 11.5–15.5)
WBC Count: 4.4 10*3/uL (ref 4.0–10.5)
nRBC: 0 % (ref 0.0–0.2)

## 2020-01-03 LAB — SAMPLE TO BLOOD BANK

## 2020-01-03 MED ORDER — SODIUM CHLORIDE 0.9 % IV SOLN
Freq: Once | INTRAVENOUS | Status: AC
  Filled 2020-01-03: qty 250

## 2020-01-03 MED ORDER — SODIUM CHLORIDE 0.9 % IV SOLN
200.0000 mg | Freq: Once | INTRAVENOUS | Status: AC
  Administered 2020-01-03: 200 mg via INTRAVENOUS
  Filled 2020-01-03: qty 200

## 2020-01-03 NOTE — Assessment & Plan Note (Signed)
She is taking infusions but not iron by mouth.  Still on MVI.  Still on PPI at baseline.  She has GI and heme f/u pending.  She is happy seeing [facility].  We can refer locally in the future if needed, d/w pt.  Questran used prn.  She has less blood in stool now after GI tx.   She had RFA with Dr.Conway on 12/07/19.  No CP.   Continue as is.  She agrees.

## 2020-01-03 NOTE — Assessment & Plan Note (Signed)
Blood pressure controlled at home.  Continue as is.  She agrees.

## 2020-01-03 NOTE — Assessment & Plan Note (Signed)
Advance directive-husband and both kids equally designated if patient were incapacitated.

## 2020-01-03 NOTE — Assessment & Plan Note (Signed)
Flu yearly. Shingles previously done PNA up-to-date Tetanus 2012 covid 2021 Colonoscopy 2020 Breast cancer screening 2021 DXA options discussed with patient. Advance directive-husband and both kids equally designated if patient were incapacitated. Cognitive function addressed- see scanned forms- and if abnormal then additional documentation follows.

## 2020-01-03 NOTE — Patient Instructions (Signed)

## 2020-01-04 DIAGNOSIS — K31819 Angiodysplasia of stomach and duodenum without bleeding: Secondary | ICD-10-CM | POA: Diagnosis not present

## 2020-01-04 DIAGNOSIS — D509 Iron deficiency anemia, unspecified: Secondary | ICD-10-CM | POA: Diagnosis not present

## 2020-01-04 DIAGNOSIS — K31811 Angiodysplasia of stomach and duodenum with bleeding: Secondary | ICD-10-CM | POA: Diagnosis not present

## 2020-01-11 ENCOUNTER — Other Ambulatory Visit: Payer: Self-pay

## 2020-01-11 ENCOUNTER — Inpatient Hospital Stay: Payer: Medicare HMO

## 2020-01-11 VITALS — BP 134/65 | HR 65 | Temp 98.7°F | Resp 16 | Wt 116.5 lb

## 2020-01-11 DIAGNOSIS — D509 Iron deficiency anemia, unspecified: Secondary | ICD-10-CM | POA: Diagnosis not present

## 2020-01-11 DIAGNOSIS — D508 Other iron deficiency anemias: Secondary | ICD-10-CM

## 2020-01-11 MED ORDER — SODIUM CHLORIDE 0.9 % IV SOLN
Freq: Once | INTRAVENOUS | Status: AC
  Filled 2020-01-11: qty 250

## 2020-01-11 MED ORDER — SODIUM CHLORIDE 0.9 % IV SOLN
200.0000 mg | Freq: Once | INTRAVENOUS | Status: AC
  Administered 2020-01-11: 200 mg via INTRAVENOUS
  Filled 2020-01-11: qty 200

## 2020-01-11 NOTE — Patient Instructions (Signed)

## 2020-01-21 DIAGNOSIS — H5203 Hypermetropia, bilateral: Secondary | ICD-10-CM | POA: Diagnosis not present

## 2020-02-08 ENCOUNTER — Other Ambulatory Visit: Payer: Self-pay | Admitting: Family Medicine

## 2020-02-15 DIAGNOSIS — K31819 Angiodysplasia of stomach and duodenum without bleeding: Secondary | ICD-10-CM | POA: Diagnosis not present

## 2020-02-15 DIAGNOSIS — D509 Iron deficiency anemia, unspecified: Secondary | ICD-10-CM | POA: Diagnosis not present

## 2020-02-15 DIAGNOSIS — K31811 Angiodysplasia of stomach and duodenum with bleeding: Secondary | ICD-10-CM | POA: Diagnosis not present

## 2020-02-16 ENCOUNTER — Other Ambulatory Visit: Payer: Self-pay | Admitting: *Deleted

## 2020-02-16 ENCOUNTER — Telehealth: Payer: Self-pay | Admitting: *Deleted

## 2020-02-16 DIAGNOSIS — C50412 Malignant neoplasm of upper-outer quadrant of left female breast: Secondary | ICD-10-CM

## 2020-02-16 DIAGNOSIS — D500 Iron deficiency anemia secondary to blood loss (chronic): Secondary | ICD-10-CM

## 2020-02-16 NOTE — Telephone Encounter (Signed)
This RN scheduled pt for lab and entered orders per pt's call stating she has completed her gastric ablation for varices and has been requested to resume monitoring her labs locally.

## 2020-02-17 ENCOUNTER — Inpatient Hospital Stay: Payer: Medicare HMO | Attending: Oncology

## 2020-02-17 ENCOUNTER — Other Ambulatory Visit: Payer: Self-pay

## 2020-02-17 DIAGNOSIS — D500 Iron deficiency anemia secondary to blood loss (chronic): Secondary | ICD-10-CM

## 2020-02-17 DIAGNOSIS — C50412 Malignant neoplasm of upper-outer quadrant of left female breast: Secondary | ICD-10-CM

## 2020-02-17 DIAGNOSIS — D509 Iron deficiency anemia, unspecified: Secondary | ICD-10-CM | POA: Insufficient documentation

## 2020-02-17 LAB — CBC WITH DIFFERENTIAL (CANCER CENTER ONLY)
Abs Immature Granulocytes: 0.01 10*3/uL (ref 0.00–0.07)
Basophils Absolute: 0 10*3/uL (ref 0.0–0.1)
Basophils Relative: 0 %
Eosinophils Absolute: 0.2 10*3/uL (ref 0.0–0.5)
Eosinophils Relative: 4 %
HCT: 31.9 % — ABNORMAL LOW (ref 36.0–46.0)
Hemoglobin: 9.9 g/dL — ABNORMAL LOW (ref 12.0–15.0)
Immature Granulocytes: 0 %
Lymphocytes Relative: 17 %
Lymphs Abs: 0.8 10*3/uL (ref 0.7–4.0)
MCH: 28.9 pg (ref 26.0–34.0)
MCHC: 31 g/dL (ref 30.0–36.0)
MCV: 93 fL (ref 80.0–100.0)
Monocytes Absolute: 0.6 10*3/uL (ref 0.1–1.0)
Monocytes Relative: 13 %
Neutro Abs: 3 10*3/uL (ref 1.7–7.7)
Neutrophils Relative %: 66 %
Platelet Count: 268 10*3/uL (ref 150–400)
RBC: 3.43 MIL/uL — ABNORMAL LOW (ref 3.87–5.11)
RDW: 15.6 % — ABNORMAL HIGH (ref 11.5–15.5)
WBC Count: 4.6 10*3/uL (ref 4.0–10.5)
nRBC: 0 % (ref 0.0–0.2)

## 2020-02-17 LAB — FERRITIN: Ferritin: 114 ng/mL (ref 11–307)

## 2020-02-17 LAB — SAMPLE TO BLOOD BANK

## 2020-02-25 DIAGNOSIS — R69 Illness, unspecified: Secondary | ICD-10-CM | POA: Diagnosis not present

## 2020-03-16 DIAGNOSIS — Z03818 Encounter for observation for suspected exposure to other biological agents ruled out: Secondary | ICD-10-CM | POA: Diagnosis not present

## 2020-03-19 ENCOUNTER — Encounter: Payer: Self-pay | Admitting: Family Medicine

## 2020-04-27 DIAGNOSIS — Z20828 Contact with and (suspected) exposure to other viral communicable diseases: Secondary | ICD-10-CM | POA: Diagnosis not present

## 2020-05-10 ENCOUNTER — Telehealth: Payer: Self-pay | Admitting: *Deleted

## 2020-05-10 NOTE — Telephone Encounter (Signed)
Pt called stating onset of " feeling really weak and am concerned my blood is dropping again ".  Appointment made for lab 05/11/2020 for review and possible set up for blood transfusion.  Pt understands transfusion may not take place until next week.

## 2020-05-11 ENCOUNTER — Other Ambulatory Visit: Payer: Self-pay | Admitting: *Deleted

## 2020-05-11 ENCOUNTER — Telehealth: Payer: Self-pay | Admitting: *Deleted

## 2020-05-11 ENCOUNTER — Other Ambulatory Visit: Payer: Self-pay

## 2020-05-11 ENCOUNTER — Inpatient Hospital Stay: Payer: Medicare HMO | Attending: Adult Health

## 2020-05-11 DIAGNOSIS — D509 Iron deficiency anemia, unspecified: Secondary | ICD-10-CM | POA: Diagnosis present

## 2020-05-11 DIAGNOSIS — D500 Iron deficiency anemia secondary to blood loss (chronic): Secondary | ICD-10-CM

## 2020-05-11 DIAGNOSIS — D649 Anemia, unspecified: Secondary | ICD-10-CM

## 2020-05-11 DIAGNOSIS — C50412 Malignant neoplasm of upper-outer quadrant of left female breast: Secondary | ICD-10-CM

## 2020-05-11 LAB — PREPARE RBC (CROSSMATCH)

## 2020-05-11 LAB — CBC WITH DIFFERENTIAL (CANCER CENTER ONLY)
Abs Immature Granulocytes: 0.01 10*3/uL (ref 0.00–0.07)
Basophils Absolute: 0 10*3/uL (ref 0.0–0.1)
Basophils Relative: 0 %
Eosinophils Absolute: 0.2 10*3/uL (ref 0.0–0.5)
Eosinophils Relative: 4 %
HCT: 25.8 % — ABNORMAL LOW (ref 36.0–46.0)
Hemoglobin: 7.7 g/dL — ABNORMAL LOW (ref 12.0–15.0)
Immature Granulocytes: 0 %
Lymphocytes Relative: 20 %
Lymphs Abs: 0.8 10*3/uL (ref 0.7–4.0)
MCH: 26.3 pg (ref 26.0–34.0)
MCHC: 29.8 g/dL — ABNORMAL LOW (ref 30.0–36.0)
MCV: 88.1 fL (ref 80.0–100.0)
Monocytes Absolute: 0.5 10*3/uL (ref 0.1–1.0)
Monocytes Relative: 11 %
Neutro Abs: 2.7 10*3/uL (ref 1.7–7.7)
Neutrophils Relative %: 65 %
Platelet Count: 290 10*3/uL (ref 150–400)
RBC: 2.93 MIL/uL — ABNORMAL LOW (ref 3.87–5.11)
RDW: 14.7 % (ref 11.5–15.5)
WBC Count: 4.2 10*3/uL (ref 4.0–10.5)
nRBC: 0 % (ref 0.0–0.2)

## 2020-05-11 LAB — FERRITIN: Ferritin: 23 ng/mL (ref 11–307)

## 2020-05-11 LAB — SAMPLE TO BLOOD BANK

## 2020-05-12 ENCOUNTER — Inpatient Hospital Stay: Payer: Medicare HMO

## 2020-05-12 DIAGNOSIS — D649 Anemia, unspecified: Secondary | ICD-10-CM

## 2020-05-12 DIAGNOSIS — D509 Iron deficiency anemia, unspecified: Secondary | ICD-10-CM | POA: Diagnosis not present

## 2020-05-12 MED ORDER — ACETAMINOPHEN 325 MG PO TABS
650.0000 mg | ORAL_TABLET | Freq: Once | ORAL | Status: AC
  Administered 2020-05-12: 650 mg via ORAL

## 2020-05-12 MED ORDER — SODIUM CHLORIDE 0.9% IV SOLUTION
250.0000 mL | Freq: Once | INTRAVENOUS | Status: AC
  Administered 2020-05-12: 250 mL via INTRAVENOUS
  Filled 2020-05-12: qty 250

## 2020-05-12 MED ORDER — DIPHENHYDRAMINE HCL 25 MG PO CAPS
25.0000 mg | ORAL_CAPSULE | Freq: Once | ORAL | Status: AC
  Administered 2020-05-12: 25 mg via ORAL

## 2020-05-12 MED ORDER — ACETAMINOPHEN 325 MG PO TABS
ORAL_TABLET | ORAL | Status: AC
  Filled 2020-05-12: qty 2

## 2020-05-12 MED ORDER — DIPHENHYDRAMINE HCL 25 MG PO CAPS
ORAL_CAPSULE | ORAL | Status: AC
  Filled 2020-05-12: qty 1

## 2020-05-12 NOTE — Patient Instructions (Signed)

## 2020-05-13 LAB — TYPE AND SCREEN
ABO/RH(D): A POS
Antibody Screen: NEGATIVE
Unit division: 0
Unit division: 0

## 2020-05-13 LAB — BPAM RBC
Blood Product Expiration Date: 202201122359
Blood Product Expiration Date: 202201122359
ISSUE DATE / TIME: 202112240831
ISSUE DATE / TIME: 202112240831
Unit Type and Rh: 6200
Unit Type and Rh: 6200

## 2020-06-01 ENCOUNTER — Other Ambulatory Visit: Payer: Self-pay | Admitting: *Deleted

## 2020-06-01 ENCOUNTER — Telehealth: Payer: Self-pay | Admitting: *Deleted

## 2020-06-01 DIAGNOSIS — D508 Other iron deficiency anemias: Secondary | ICD-10-CM

## 2020-06-01 NOTE — Telephone Encounter (Signed)
Pt called stating concern for heme level due to noted bleeding ( known issue she is followed under GI ) and possible need for transfusion.

## 2020-06-02 ENCOUNTER — Other Ambulatory Visit: Payer: Self-pay

## 2020-06-02 ENCOUNTER — Inpatient Hospital Stay: Payer: Medicare Other | Attending: Adult Health

## 2020-06-02 DIAGNOSIS — D509 Iron deficiency anemia, unspecified: Secondary | ICD-10-CM | POA: Diagnosis not present

## 2020-06-02 DIAGNOSIS — D508 Other iron deficiency anemias: Secondary | ICD-10-CM

## 2020-06-02 LAB — CBC WITH DIFFERENTIAL (CANCER CENTER ONLY)
Abs Immature Granulocytes: 0.02 10*3/uL (ref 0.00–0.07)
Basophils Absolute: 0 10*3/uL (ref 0.0–0.1)
Basophils Relative: 0 %
Eosinophils Absolute: 0.2 10*3/uL (ref 0.0–0.5)
Eosinophils Relative: 5 %
HCT: 32.5 % — ABNORMAL LOW (ref 36.0–46.0)
Hemoglobin: 9.8 g/dL — ABNORMAL LOW (ref 12.0–15.0)
Immature Granulocytes: 0 %
Lymphocytes Relative: 18 %
Lymphs Abs: 0.8 10*3/uL (ref 0.7–4.0)
MCH: 26.8 pg (ref 26.0–34.0)
MCHC: 30.2 g/dL (ref 30.0–36.0)
MCV: 89 fL (ref 80.0–100.0)
Monocytes Absolute: 0.5 10*3/uL (ref 0.1–1.0)
Monocytes Relative: 11 %
Neutro Abs: 3 10*3/uL (ref 1.7–7.7)
Neutrophils Relative %: 66 %
Platelet Count: 294 10*3/uL (ref 150–400)
RBC: 3.65 MIL/uL — ABNORMAL LOW (ref 3.87–5.11)
RDW: 15.1 % (ref 11.5–15.5)
WBC Count: 4.6 10*3/uL (ref 4.0–10.5)
nRBC: 0 % (ref 0.0–0.2)

## 2020-06-02 LAB — FERRITIN: Ferritin: 22 ng/mL (ref 11–307)

## 2020-06-02 LAB — SAMPLE TO BLOOD BANK

## 2020-06-02 NOTE — Telephone Encounter (Signed)
No entry 

## 2020-06-06 ENCOUNTER — Other Ambulatory Visit: Payer: Self-pay | Admitting: Oncology

## 2020-06-13 ENCOUNTER — Other Ambulatory Visit: Payer: Self-pay | Admitting: *Deleted

## 2020-06-14 ENCOUNTER — Inpatient Hospital Stay: Payer: Medicare Other

## 2020-06-14 ENCOUNTER — Other Ambulatory Visit: Payer: Self-pay

## 2020-06-14 VITALS — BP 150/64 | HR 62 | Temp 98.2°F | Resp 17

## 2020-06-14 DIAGNOSIS — D508 Other iron deficiency anemias: Secondary | ICD-10-CM

## 2020-06-14 DIAGNOSIS — C50412 Malignant neoplasm of upper-outer quadrant of left female breast: Secondary | ICD-10-CM

## 2020-06-14 DIAGNOSIS — D509 Iron deficiency anemia, unspecified: Secondary | ICD-10-CM | POA: Diagnosis not present

## 2020-06-14 DIAGNOSIS — D500 Iron deficiency anemia secondary to blood loss (chronic): Secondary | ICD-10-CM

## 2020-06-14 LAB — CBC WITH DIFFERENTIAL (CANCER CENTER ONLY)
Abs Immature Granulocytes: 0.01 10*3/uL (ref 0.00–0.07)
Basophils Absolute: 0 10*3/uL (ref 0.0–0.1)
Basophils Relative: 0 %
Eosinophils Absolute: 0.2 10*3/uL (ref 0.0–0.5)
Eosinophils Relative: 5 %
HCT: 29.1 % — ABNORMAL LOW (ref 36.0–46.0)
Hemoglobin: 8.8 g/dL — ABNORMAL LOW (ref 12.0–15.0)
Immature Granulocytes: 0 %
Lymphocytes Relative: 17 %
Lymphs Abs: 0.8 10*3/uL (ref 0.7–4.0)
MCH: 27.1 pg (ref 26.0–34.0)
MCHC: 30.2 g/dL (ref 30.0–36.0)
MCV: 89.5 fL (ref 80.0–100.0)
Monocytes Absolute: 0.4 10*3/uL (ref 0.1–1.0)
Monocytes Relative: 10 %
Neutro Abs: 3.1 10*3/uL (ref 1.7–7.7)
Neutrophils Relative %: 68 %
Platelet Count: 239 10*3/uL (ref 150–400)
RBC: 3.25 MIL/uL — ABNORMAL LOW (ref 3.87–5.11)
RDW: 16.7 % — ABNORMAL HIGH (ref 11.5–15.5)
WBC Count: 4.5 10*3/uL (ref 4.0–10.5)
nRBC: 0 % (ref 0.0–0.2)

## 2020-06-14 LAB — FERRITIN: Ferritin: 16 ng/mL (ref 11–307)

## 2020-06-14 LAB — SAMPLE TO BLOOD BANK

## 2020-06-14 MED ORDER — SODIUM CHLORIDE 0.9 % IV SOLN
300.0000 mg | Freq: Once | INTRAVENOUS | Status: DC
  Filled 2020-06-14: qty 15

## 2020-06-14 MED ORDER — SODIUM CHLORIDE 0.9 % IV SOLN
INTRAVENOUS | Status: DC
  Filled 2020-06-14: qty 250

## 2020-06-14 MED ORDER — SODIUM CHLORIDE 0.9 % IV SOLN
200.0000 mg | Freq: Once | INTRAVENOUS | Status: AC
  Administered 2020-06-14: 200 mg via INTRAVENOUS
  Filled 2020-06-14: qty 200

## 2020-06-14 NOTE — Patient Instructions (Signed)

## 2020-06-14 NOTE — Progress Notes (Signed)
Per Dr Jana Hakim, will keep 200mg  dose of venofer today.

## 2020-06-29 ENCOUNTER — Other Ambulatory Visit: Payer: Self-pay

## 2020-06-29 ENCOUNTER — Inpatient Hospital Stay: Payer: Medicare Other | Attending: Adult Health

## 2020-06-29 DIAGNOSIS — D508 Other iron deficiency anemias: Secondary | ICD-10-CM

## 2020-06-29 DIAGNOSIS — D649 Anemia, unspecified: Secondary | ICD-10-CM

## 2020-06-29 DIAGNOSIS — D509 Iron deficiency anemia, unspecified: Secondary | ICD-10-CM | POA: Diagnosis not present

## 2020-06-29 LAB — CBC WITH DIFFERENTIAL (CANCER CENTER ONLY)
Abs Immature Granulocytes: 0.02 10*3/uL (ref 0.00–0.07)
Basophils Absolute: 0 10*3/uL (ref 0.0–0.1)
Basophils Relative: 0 %
Eosinophils Absolute: 0.2 10*3/uL (ref 0.0–0.5)
Eosinophils Relative: 3 %
HCT: 27.9 % — ABNORMAL LOW (ref 36.0–46.0)
Hemoglobin: 8.6 g/dL — ABNORMAL LOW (ref 12.0–15.0)
Immature Granulocytes: 0 %
Lymphocytes Relative: 9 %
Lymphs Abs: 0.6 10*3/uL — ABNORMAL LOW (ref 0.7–4.0)
MCH: 27.8 pg (ref 26.0–34.0)
MCHC: 30.8 g/dL (ref 30.0–36.0)
MCV: 90.3 fL (ref 80.0–100.0)
Monocytes Absolute: 0.8 10*3/uL (ref 0.1–1.0)
Monocytes Relative: 11 %
Neutro Abs: 5.2 10*3/uL (ref 1.7–7.7)
Neutrophils Relative %: 77 %
Platelet Count: 243 10*3/uL (ref 150–400)
RBC: 3.09 MIL/uL — ABNORMAL LOW (ref 3.87–5.11)
RDW: 18.4 % — ABNORMAL HIGH (ref 11.5–15.5)
WBC Count: 6.7 10*3/uL (ref 4.0–10.5)
nRBC: 0 % (ref 0.0–0.2)

## 2020-06-29 LAB — SAMPLE TO BLOOD BANK

## 2020-06-29 NOTE — Progress Notes (Signed)
Per MD review of labs - recommendations for iron transfusion.   Hgb is not indication for blood transfusion.   MD verbal orders given for Venofer 200mg  X 5.  Labs will be obtained prior to first transfusion.  Pt notified and verbalized understanding and agreement.  Scheduling message sent.

## 2020-06-29 NOTE — Progress Notes (Signed)
Pt called request labs to be drawn because, "I feel like my hemoglobin is dropping."  Patient denies any active bleeding. Denies any dizziness or shortness of breath.  Patient only complaint is feeling weak.     Per MD recommendations CBC with type and cross today for evaluation.   Pt notified, verbalized understanding and agreement.   Lab apt made for today @ 1315.

## 2020-06-30 ENCOUNTER — Telehealth: Payer: Self-pay | Admitting: Oncology

## 2020-06-30 NOTE — Telephone Encounter (Signed)
Scheduled appt per 2/10 sch msg - unable to reach pt. Left message for patient with appt date and time

## 2020-07-06 ENCOUNTER — Other Ambulatory Visit: Payer: Self-pay | Admitting: Pharmacist

## 2020-07-07 ENCOUNTER — Inpatient Hospital Stay: Payer: Medicare Other

## 2020-07-07 ENCOUNTER — Other Ambulatory Visit: Payer: Self-pay

## 2020-07-07 VITALS — BP 149/60 | HR 63 | Temp 97.9°F | Resp 18

## 2020-07-07 DIAGNOSIS — D500 Iron deficiency anemia secondary to blood loss (chronic): Secondary | ICD-10-CM

## 2020-07-07 DIAGNOSIS — D508 Other iron deficiency anemias: Secondary | ICD-10-CM

## 2020-07-07 DIAGNOSIS — D509 Iron deficiency anemia, unspecified: Secondary | ICD-10-CM | POA: Diagnosis not present

## 2020-07-07 DIAGNOSIS — Z170 Estrogen receptor positive status [ER+]: Secondary | ICD-10-CM

## 2020-07-07 DIAGNOSIS — C50412 Malignant neoplasm of upper-outer quadrant of left female breast: Secondary | ICD-10-CM

## 2020-07-07 LAB — CBC WITH DIFFERENTIAL (CANCER CENTER ONLY)
Abs Immature Granulocytes: 0.01 10*3/uL (ref 0.00–0.07)
Basophils Absolute: 0 10*3/uL (ref 0.0–0.1)
Basophils Relative: 0 %
Eosinophils Absolute: 0.3 10*3/uL (ref 0.0–0.5)
Eosinophils Relative: 6 %
HCT: 28.9 % — ABNORMAL LOW (ref 36.0–46.0)
Hemoglobin: 8.5 g/dL — ABNORMAL LOW (ref 12.0–15.0)
Immature Granulocytes: 0 %
Lymphocytes Relative: 19 %
Lymphs Abs: 0.7 10*3/uL (ref 0.7–4.0)
MCH: 27.2 pg (ref 26.0–34.0)
MCHC: 29.4 g/dL — ABNORMAL LOW (ref 30.0–36.0)
MCV: 92.3 fL (ref 80.0–100.0)
Monocytes Absolute: 0.5 10*3/uL (ref 0.1–1.0)
Monocytes Relative: 12 %
Neutro Abs: 2.5 10*3/uL (ref 1.7–7.7)
Neutrophils Relative %: 63 %
Platelet Count: 303 10*3/uL (ref 150–400)
RBC: 3.13 MIL/uL — ABNORMAL LOW (ref 3.87–5.11)
RDW: 16.9 % — ABNORMAL HIGH (ref 11.5–15.5)
WBC Count: 3.9 10*3/uL — ABNORMAL LOW (ref 4.0–10.5)
nRBC: 0 % (ref 0.0–0.2)

## 2020-07-07 LAB — IRON AND TIBC
Iron: 21 ug/dL — ABNORMAL LOW (ref 41–142)
Saturation Ratios: 6 % — ABNORMAL LOW (ref 21–57)
TIBC: 366 ug/dL (ref 236–444)
UIBC: 345 ug/dL (ref 120–384)

## 2020-07-07 LAB — FERRITIN: Ferritin: 46 ng/mL (ref 11–307)

## 2020-07-07 LAB — SAMPLE TO BLOOD BANK

## 2020-07-07 MED ORDER — SODIUM CHLORIDE 0.9 % IV SOLN
200.0000 mg | Freq: Once | INTRAVENOUS | Status: AC
  Administered 2020-07-07: 200 mg via INTRAVENOUS
  Filled 2020-07-07: qty 10

## 2020-07-07 MED ORDER — SODIUM CHLORIDE 0.9 % IV SOLN
Freq: Once | INTRAVENOUS | Status: AC
  Filled 2020-07-07: qty 250

## 2020-07-07 NOTE — Progress Notes (Signed)
Patient declined to stay for 30 minute post observation period. VSS upon leaving infusion room.  

## 2020-07-07 NOTE — Patient Instructions (Signed)

## 2020-07-14 ENCOUNTER — Inpatient Hospital Stay: Payer: Medicare Other

## 2020-07-14 ENCOUNTER — Other Ambulatory Visit: Payer: Self-pay

## 2020-07-14 VITALS — BP 118/58 | HR 75 | Temp 98.7°F | Resp 16

## 2020-07-14 DIAGNOSIS — D509 Iron deficiency anemia, unspecified: Secondary | ICD-10-CM | POA: Diagnosis not present

## 2020-07-14 DIAGNOSIS — D508 Other iron deficiency anemias: Secondary | ICD-10-CM

## 2020-07-14 MED ORDER — SODIUM CHLORIDE 0.9 % IV SOLN
Freq: Once | INTRAVENOUS | Status: AC
  Filled 2020-07-14: qty 250

## 2020-07-14 MED ORDER — IRON SUCROSE 20 MG/ML IV SOLN
200.0000 mg | Freq: Once | INTRAVENOUS | Status: AC
  Administered 2020-07-14: 200 mg via INTRAVENOUS
  Filled 2020-07-14: qty 200

## 2020-07-14 NOTE — Progress Notes (Signed)
Pt. declines to stay 30 minute post observation. States she tolerates treatment without any issues. Pt. left via ambulation, no respiratory distress noted.

## 2020-07-14 NOTE — Patient Instructions (Signed)

## 2020-07-21 ENCOUNTER — Inpatient Hospital Stay: Payer: Medicare Other | Attending: Adult Health

## 2020-07-21 ENCOUNTER — Other Ambulatory Visit: Payer: Self-pay

## 2020-07-21 VITALS — BP 135/64 | HR 66 | Temp 97.8°F | Resp 16

## 2020-07-21 DIAGNOSIS — D508 Other iron deficiency anemias: Secondary | ICD-10-CM

## 2020-07-21 DIAGNOSIS — D509 Iron deficiency anemia, unspecified: Secondary | ICD-10-CM | POA: Diagnosis not present

## 2020-07-21 MED ORDER — SODIUM CHLORIDE 0.9 % IV SOLN
Freq: Once | INTRAVENOUS | Status: AC
  Filled 2020-07-21: qty 250

## 2020-07-21 MED ORDER — IRON SUCROSE 20 MG/ML IV SOLN
200.0000 mg | Freq: Once | INTRAVENOUS | Status: AC
  Administered 2020-07-21: 200 mg via INTRAVENOUS
  Filled 2020-07-21: qty 10

## 2020-07-21 NOTE — Patient Instructions (Signed)

## 2020-07-21 NOTE — Progress Notes (Signed)
Patient declined to stay for 30 minute post observation. VSS upon leaving infusion room.

## 2020-07-24 LAB — HM MAMMOGRAPHY

## 2020-07-28 ENCOUNTER — Inpatient Hospital Stay: Payer: Medicare Other

## 2020-07-28 ENCOUNTER — Other Ambulatory Visit: Payer: Self-pay

## 2020-07-28 VITALS — BP 129/58 | HR 74 | Temp 98.7°F | Resp 17

## 2020-07-28 DIAGNOSIS — D509 Iron deficiency anemia, unspecified: Secondary | ICD-10-CM | POA: Diagnosis not present

## 2020-07-28 DIAGNOSIS — D508 Other iron deficiency anemias: Secondary | ICD-10-CM

## 2020-07-28 MED ORDER — SODIUM CHLORIDE 0.9 % IV SOLN
200.0000 mg | Freq: Once | INTRAVENOUS | Status: AC
  Administered 2020-07-28: 200 mg via INTRAVENOUS
  Filled 2020-07-28: qty 200

## 2020-07-28 NOTE — Progress Notes (Signed)
Patient declined 30 minute post observation.  VSS.  No s/s or c/o distress or discomfort.  Patient able to ambulate independently to exit.

## 2020-08-04 ENCOUNTER — Inpatient Hospital Stay: Payer: Medicare Other

## 2020-08-04 ENCOUNTER — Other Ambulatory Visit: Payer: Self-pay

## 2020-08-04 VITALS — BP 147/61 | HR 65 | Temp 98.7°F | Resp 17

## 2020-08-04 DIAGNOSIS — D508 Other iron deficiency anemias: Secondary | ICD-10-CM

## 2020-08-04 DIAGNOSIS — Z170 Estrogen receptor positive status [ER+]: Secondary | ICD-10-CM

## 2020-08-04 DIAGNOSIS — D509 Iron deficiency anemia, unspecified: Secondary | ICD-10-CM | POA: Diagnosis not present

## 2020-08-04 DIAGNOSIS — D500 Iron deficiency anemia secondary to blood loss (chronic): Secondary | ICD-10-CM

## 2020-08-04 DIAGNOSIS — C50412 Malignant neoplasm of upper-outer quadrant of left female breast: Secondary | ICD-10-CM

## 2020-08-04 LAB — CBC WITH DIFFERENTIAL (CANCER CENTER ONLY)
Abs Immature Granulocytes: 0.01 10*3/uL (ref 0.00–0.07)
Basophils Absolute: 0 10*3/uL (ref 0.0–0.1)
Basophils Relative: 0 %
Eosinophils Absolute: 0.3 10*3/uL (ref 0.0–0.5)
Eosinophils Relative: 6 %
HCT: 28.1 % — ABNORMAL LOW (ref 36.0–46.0)
Hemoglobin: 8.4 g/dL — ABNORMAL LOW (ref 12.0–15.0)
Immature Granulocytes: 0 %
Lymphocytes Relative: 16 %
Lymphs Abs: 0.8 10*3/uL (ref 0.7–4.0)
MCH: 28 pg (ref 26.0–34.0)
MCHC: 29.9 g/dL — ABNORMAL LOW (ref 30.0–36.0)
MCV: 93.7 fL (ref 80.0–100.0)
Monocytes Absolute: 0.5 10*3/uL (ref 0.1–1.0)
Monocytes Relative: 11 %
Neutro Abs: 3.3 10*3/uL (ref 1.7–7.7)
Neutrophils Relative %: 67 %
Platelet Count: 280 10*3/uL (ref 150–400)
RBC: 3 MIL/uL — ABNORMAL LOW (ref 3.87–5.11)
RDW: 17.6 % — ABNORMAL HIGH (ref 11.5–15.5)
WBC Count: 4.9 10*3/uL (ref 4.0–10.5)
nRBC: 0 % (ref 0.0–0.2)

## 2020-08-04 LAB — FERRITIN: Ferritin: 185 ng/mL (ref 11–307)

## 2020-08-04 LAB — SAMPLE TO BLOOD BANK

## 2020-08-04 MED ORDER — SODIUM CHLORIDE 0.9 % IV SOLN
Freq: Once | INTRAVENOUS | Status: AC
  Filled 2020-08-04: qty 250

## 2020-08-04 MED ORDER — SODIUM CHLORIDE 0.9 % IV SOLN
200.0000 mg | Freq: Once | INTRAVENOUS | Status: AC
  Administered 2020-08-04: 200 mg via INTRAVENOUS
  Filled 2020-08-04: qty 200

## 2020-08-04 NOTE — Patient Instructions (Signed)

## 2020-08-04 NOTE — Progress Notes (Signed)
Pt declined to stay 30 min after venofer infusion. VSS and pt stable at time of discharge. ambulatory to lobby

## 2020-10-04 ENCOUNTER — Other Ambulatory Visit: Payer: Self-pay | Admitting: *Deleted

## 2020-10-04 DIAGNOSIS — D508 Other iron deficiency anemias: Secondary | ICD-10-CM

## 2020-10-04 DIAGNOSIS — D500 Iron deficiency anemia secondary to blood loss (chronic): Secondary | ICD-10-CM

## 2020-10-04 NOTE — Progress Notes (Signed)
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  Lab appt made for tomorrow- orders entered.

## 2020-10-05 ENCOUNTER — Inpatient Hospital Stay: Payer: Medicare Other | Attending: Adult Health

## 2020-10-05 ENCOUNTER — Telehealth: Payer: Self-pay | Admitting: *Deleted

## 2020-10-05 ENCOUNTER — Other Ambulatory Visit: Payer: Self-pay

## 2020-10-05 DIAGNOSIS — D509 Iron deficiency anemia, unspecified: Secondary | ICD-10-CM | POA: Insufficient documentation

## 2020-10-05 DIAGNOSIS — D500 Iron deficiency anemia secondary to blood loss (chronic): Secondary | ICD-10-CM

## 2020-10-05 DIAGNOSIS — C50412 Malignant neoplasm of upper-outer quadrant of left female breast: Secondary | ICD-10-CM

## 2020-10-05 DIAGNOSIS — D508 Other iron deficiency anemias: Secondary | ICD-10-CM

## 2020-10-05 DIAGNOSIS — Z170 Estrogen receptor positive status [ER+]: Secondary | ICD-10-CM

## 2020-10-05 LAB — SAMPLE TO BLOOD BANK

## 2020-10-05 LAB — CBC WITH DIFFERENTIAL (CANCER CENTER ONLY)
Abs Immature Granulocytes: 0.01 10*3/uL (ref 0.00–0.07)
Basophils Absolute: 0 10*3/uL (ref 0.0–0.1)
Basophils Relative: 0 %
Eosinophils Absolute: 0.2 10*3/uL (ref 0.0–0.5)
Eosinophils Relative: 5 %
HCT: 31.2 % — ABNORMAL LOW (ref 36.0–46.0)
Hemoglobin: 9.6 g/dL — ABNORMAL LOW (ref 12.0–15.0)
Immature Granulocytes: 0 %
Lymphocytes Relative: 15 %
Lymphs Abs: 0.7 10*3/uL (ref 0.7–4.0)
MCH: 28.2 pg (ref 26.0–34.0)
MCHC: 30.8 g/dL (ref 30.0–36.0)
MCV: 91.5 fL (ref 80.0–100.0)
Monocytes Absolute: 0.5 10*3/uL (ref 0.1–1.0)
Monocytes Relative: 12 %
Neutro Abs: 2.9 10*3/uL (ref 1.7–7.7)
Neutrophils Relative %: 68 %
Platelet Count: 279 10*3/uL (ref 150–400)
RBC: 3.41 MIL/uL — ABNORMAL LOW (ref 3.87–5.11)
RDW: 15.2 % (ref 11.5–15.5)
WBC Count: 4.4 10*3/uL (ref 4.0–10.5)
nRBC: 0 % (ref 0.0–0.2)

## 2020-10-05 LAB — FERRITIN: Ferritin: 54 ng/mL (ref 11–307)

## 2020-10-05 NOTE — Telephone Encounter (Signed)
Per lab draw today heme noted as 9.6.  Reviewed with pt who was very pleased with result though she is having some mild symptoms likened to when her heme is lower. " but maybe I am just tired from doing too much yard work ".  She states she has not noted increased bleeding She states she has started an oral iron tablet " found one that does not upset my stomach too much ".  Per discussion and concern for symptoms- pt will come in for lab recheck in 1 week for review and possible scheduling of transfusion if needed.  No other needs at this time. This note will be sent to MD for review.

## 2020-12-07 ENCOUNTER — Other Ambulatory Visit: Payer: Self-pay | Admitting: *Deleted

## 2020-12-07 ENCOUNTER — Other Ambulatory Visit: Payer: Self-pay

## 2020-12-07 ENCOUNTER — Inpatient Hospital Stay: Payer: Medicare Other | Attending: Adult Health

## 2020-12-07 DIAGNOSIS — D509 Iron deficiency anemia, unspecified: Secondary | ICD-10-CM | POA: Diagnosis not present

## 2020-12-07 DIAGNOSIS — D500 Iron deficiency anemia secondary to blood loss (chronic): Secondary | ICD-10-CM

## 2020-12-07 DIAGNOSIS — C50412 Malignant neoplasm of upper-outer quadrant of left female breast: Secondary | ICD-10-CM

## 2020-12-07 DIAGNOSIS — Z170 Estrogen receptor positive status [ER+]: Secondary | ICD-10-CM

## 2020-12-07 DIAGNOSIS — D649 Anemia, unspecified: Secondary | ICD-10-CM

## 2020-12-07 LAB — CBC WITH DIFFERENTIAL (CANCER CENTER ONLY)
Abs Immature Granulocytes: 0.03 10*3/uL (ref 0.00–0.07)
Basophils Absolute: 0 10*3/uL (ref 0.0–0.1)
Basophils Relative: 0 %
Eosinophils Absolute: 0.1 10*3/uL (ref 0.0–0.5)
Eosinophils Relative: 2 %
HCT: 28.3 % — ABNORMAL LOW (ref 36.0–46.0)
Hemoglobin: 8.9 g/dL — ABNORMAL LOW (ref 12.0–15.0)
Immature Granulocytes: 0 %
Lymphocytes Relative: 8 %
Lymphs Abs: 0.6 10*3/uL — ABNORMAL LOW (ref 0.7–4.0)
MCH: 27.9 pg (ref 26.0–34.0)
MCHC: 31.4 g/dL (ref 30.0–36.0)
MCV: 88.7 fL (ref 80.0–100.0)
Monocytes Absolute: 0.8 10*3/uL (ref 0.1–1.0)
Monocytes Relative: 10 %
Neutro Abs: 6.2 10*3/uL (ref 1.7–7.7)
Neutrophils Relative %: 80 %
Platelet Count: 237 10*3/uL (ref 150–400)
RBC: 3.19 MIL/uL — ABNORMAL LOW (ref 3.87–5.11)
RDW: 15.3 % (ref 11.5–15.5)
WBC Count: 7.8 10*3/uL (ref 4.0–10.5)
nRBC: 0 % (ref 0.0–0.2)

## 2020-12-07 LAB — PREPARE RBC (CROSSMATCH)

## 2020-12-07 LAB — SAMPLE TO BLOOD BANK

## 2020-12-07 LAB — FERRITIN: Ferritin: 34 ng/mL (ref 11–307)

## 2020-12-09 ENCOUNTER — Inpatient Hospital Stay: Payer: Medicare Other

## 2020-12-09 ENCOUNTER — Other Ambulatory Visit: Payer: Self-pay

## 2020-12-09 DIAGNOSIS — D649 Anemia, unspecified: Secondary | ICD-10-CM

## 2020-12-09 DIAGNOSIS — D509 Iron deficiency anemia, unspecified: Secondary | ICD-10-CM | POA: Diagnosis not present

## 2020-12-09 MED ORDER — ACETAMINOPHEN 325 MG PO TABS
ORAL_TABLET | ORAL | Status: AC
  Filled 2020-12-09: qty 2

## 2020-12-09 MED ORDER — DIPHENHYDRAMINE HCL 25 MG PO CAPS
ORAL_CAPSULE | ORAL | Status: AC
  Filled 2020-12-09: qty 1

## 2020-12-09 MED ORDER — DIPHENHYDRAMINE HCL 25 MG PO CAPS
25.0000 mg | ORAL_CAPSULE | Freq: Once | ORAL | Status: AC
  Administered 2020-12-09: 25 mg via ORAL

## 2020-12-09 MED ORDER — ACETAMINOPHEN 325 MG PO TABS
650.0000 mg | ORAL_TABLET | Freq: Once | ORAL | Status: AC
  Administered 2020-12-09: 650 mg via ORAL

## 2020-12-09 MED ORDER — SODIUM CHLORIDE 0.9% IV SOLUTION
250.0000 mL | Freq: Once | INTRAVENOUS | Status: AC
Start: 2020-12-09 — End: 2020-12-09
  Administered 2020-12-09: 250 mL via INTRAVENOUS
  Filled 2020-12-09: qty 250

## 2020-12-11 LAB — TYPE AND SCREEN
ABO/RH(D): A POS
Antibody Screen: NEGATIVE
Unit division: 0

## 2020-12-11 LAB — BPAM RBC
Blood Product Expiration Date: 202208102359
ISSUE DATE / TIME: 202207230903
Unit Type and Rh: 6200

## 2020-12-17 ENCOUNTER — Other Ambulatory Visit: Payer: Self-pay | Admitting: Family Medicine

## 2020-12-17 DIAGNOSIS — I10 Essential (primary) hypertension: Secondary | ICD-10-CM

## 2020-12-17 DIAGNOSIS — E559 Vitamin D deficiency, unspecified: Secondary | ICD-10-CM

## 2020-12-20 ENCOUNTER — Telehealth: Payer: Self-pay

## 2020-12-20 ENCOUNTER — Ambulatory Visit (INDEPENDENT_AMBULATORY_CARE_PROVIDER_SITE_OTHER): Payer: Medicare Other

## 2020-12-20 DIAGNOSIS — Z0000 Encounter for general adult medical examination without abnormal findings: Secondary | ICD-10-CM

## 2020-12-20 DIAGNOSIS — D508 Other iron deficiency anemias: Secondary | ICD-10-CM

## 2020-12-20 NOTE — Progress Notes (Signed)
PCP notes:  Health Maintenance: Dexa- due   Abnormal Screenings: none   Patient concerns: Wants Iron levels checked    Nurse concerns: none   Next PCP appt.: 01/02/2021 @ 2:30 pm

## 2020-12-20 NOTE — Telephone Encounter (Signed)
Completed AWV. Patient wanted me to send a message to Dr. Damita Dunnings asking if an order can be added to her labs to have her iron levels checked. Please advise.

## 2020-12-20 NOTE — Telephone Encounter (Signed)
Patient notified iron was added to her lab work for 12/29/20.

## 2020-12-20 NOTE — Telephone Encounter (Signed)
Added.  Thanks.

## 2020-12-20 NOTE — Addendum Note (Signed)
Addended by: [name] on: 12/20/2020 01:53 PM   Modules accepted: Orders

## 2020-12-20 NOTE — Patient Instructions (Signed)
Kristi Carr , Thank you for taking time to come for your Medicare Wellness Visit. I appreciate your ongoing commitment to your health goals. Please review the following plan we discussed and let me know if I can assist you in the future.   Screening recommendations/referrals: Colonoscopy: Up to date, completed 04/05/2019, due 03/2024 Mammogram: Up to date, completed 07/24/2020, due 07/2021 Bone Density: due, will discuss with provider  Recommended yearly ophthalmology/optometry visit for glaucoma screening and checkup Recommended yearly dental visit for hygiene and checkup  Vaccinations: Influenza vaccine: due Fall 2022  Pneumococcal vaccine: Completed series Tdap vaccine: decline-insurance Shingles vaccine: Completed series   Covid-19:completed 3 vaccines  Advanced directives: copy in chart  Conditions/risks identified: hypertension, hypercholesterolemia  Next appointment: Follow up in one year for your annual wellness visit    Preventive Care 31 Years and Older, Female Preventive care refers to lifestyle choices and visits with your health care provider that can promote health and wellness. What does preventive care include? A yearly physical exam. This is also called an annual well check. Dental exams once or twice a year. Routine eye exams. Ask your health care provider how often you should have your eyes checked. Personal lifestyle choices, including: Daily care of your teeth and gums. Regular physical activity. Eating a healthy diet. Avoiding tobacco and drug use. Limiting alcohol use. Practicing safe sex. Taking low-dose aspirin every day. Taking vitamin and mineral supplements as recommended by your health care provider. What happens during an annual well check? The services and screenings done by your health care provider during your annual well check will depend on your age, overall health, lifestyle risk factors, and family history of disease. Counseling  Your health  care provider may ask you questions about your: Alcohol use. Tobacco use. Drug use. Emotional well-being. Home and relationship well-being. Sexual activity. Eating habits. History of falls. Memory and ability to understand (cognition). Work and work Statistician. Reproductive health. Screening  You may have the following tests or measurements: Height, weight, and BMI. Blood pressure. Lipid and cholesterol levels. These may be checked every 5 years, or more frequently if you are over 48 years old. Skin check. Lung cancer screening. You may have this screening every year starting at age 68 if you have a 30-pack-year history of smoking and currently smoke or have quit within the past 15 years. Fecal occult blood test (FOBT) of the stool. You may have this test every year starting at age 79. Flexible sigmoidoscopy or colonoscopy. You may have a sigmoidoscopy every 5 years or a colonoscopy every 10 years starting at age 36. Hepatitis C blood test. Hepatitis B blood test. Sexually transmitted disease (STD) testing. Diabetes screening. This is done by checking your blood sugar (glucose) after you have not eaten for a while (fasting). You may have this done every 1-3 years. Bone density scan. This is done to screen for osteoporosis. You may have this done starting at age 62. Mammogram. This may be done every 1-2 years. Talk to your health care provider about how often you should have regular mammograms. Talk with your health care provider about your test results, treatment options, and if necessary, the need for more tests. Vaccines  Your health care provider may recommend certain vaccines, such as: Influenza vaccine. This is recommended every year. Tetanus, diphtheria, and acellular pertussis (Tdap, Td) vaccine. You may need a Td booster every 10 years. Zoster vaccine. You may need this after age 52. Pneumococcal 13-valent conjugate (PCV13) vaccine. One dose is  recommended after age  76. Pneumococcal polysaccharide (PPSV23) vaccine. One dose is recommended after age 10. Talk to your health care provider about which screenings and vaccines you need and how often you need them. This information is not intended to replace advice given to you by your health care provider. Make sure you discuss any questions you have with your health care provider. Document Released: 06/02/2015 Document Revised: 01/24/2016 Document Reviewed: 03/07/2015 Elsevier Interactive Patient Education  2017 [employer] Prevention in the Home Falls can cause injuries. They can happen to people of all ages. There are many things you can do to make your home safe and to help prevent falls. What can I do on the outside of my home? Regularly fix the edges of walkways and driveways and fix any cracks. Remove anything that might make you trip as you walk through a door, such as a raised step or threshold. Trim any bushes or trees on the path to your home. Use bright outdoor lighting. Clear any walking paths of anything that might make someone trip, such as rocks or tools. Regularly check to see if handrails are loose or broken. Make sure that both sides of any steps have handrails. Any raised decks and porches should have guardrails on the edges. Have any leaves, snow, or ice cleared regularly. Use sand or salt on walking paths during winter. Clean up any spills in your garage right away. This includes oil or grease spills. What can I do in the bathroom? Use night lights. Install grab bars by the toilet and in the tub and shower. Do not use towel bars as grab bars. Use non-skid mats or decals in the tub or shower. If you need to sit down in the shower, use a plastic, non-slip stool. Keep the floor dry. Clean up any water that spills on the floor as soon as it happens. Remove soap buildup in the tub or shower regularly. Attach bath mats securely with double-sided non-slip rug tape. Do not have throw  rugs and other things on the floor that can make you trip. What can I do in the bedroom? Use night lights. Make sure that you have a light by your bed that is easy to reach. Do not use any sheets or blankets that are too big for your bed. They should not hang down onto the floor. Have a firm chair that has side arms. You can use this for support while you get dressed. Do not have throw rugs and other things on the floor that can make you trip. What can I do in the kitchen? Clean up any spills right away. Avoid walking on wet floors. Keep items that you use a lot in easy-to-reach places. If you need to reach something above you, use a strong step stool that has a grab bar. Keep electrical cords out of the way. Do not use floor polish or wax that makes floors slippery. If you must use wax, use non-skid floor wax. Do not have throw rugs and other things on the floor that can make you trip. What can I do with my stairs? Do not leave any items on the stairs. Make sure that there are handrails on both sides of the stairs and use them. Fix handrails that are broken or loose. Make sure that handrails are as long as the stairways. Check any carpeting to make sure that it is firmly attached to the stairs. Fix any carpet that is loose or worn. Avoid having  throw rugs at the top or bottom of the stairs. If you do have throw rugs, attach them to the floor with carpet tape. Make sure that you have a light switch at the top of the stairs and the bottom of the stairs. If you do not have them, ask someone to add them for you. What else can I do to help prevent falls? Wear shoes that: Do not have high heels. Have rubber bottoms. Are comfortable and fit you well. Are closed at the toe. Do not wear sandals. If you use a stepladder: Make sure that it is fully opened. Do not climb a closed stepladder. Make sure that both sides of the stepladder are locked into place. Ask someone to hold it for you, if  possible. Clearly mark and make sure that you can see: Any grab bars or handrails. First and last steps. Where the edge of each step is. Use tools that help you move around (mobility aids) if they are needed. These include: Canes. Walkers. Scooters. Crutches. Turn on the lights when you go into a dark area. Replace any light bulbs as soon as they burn out. Set up your furniture so you have a clear path. Avoid moving your furniture around. If any of your floors are uneven, fix them. If there are any pets around you, be aware of where they are. Review your medicines with your doctor. Some medicines can make you feel dizzy. This can increase your chance of falling. Ask your doctor what other things that you can do to help prevent falls. This information is not intended to replace advice given to you by your health care provider. Make sure you discuss any questions you have with your health care provider. Document Released: 03/02/2009 Document Revised: 10/12/2015 Document Reviewed: 06/10/2014 Elsevier Interactive Patient Education  2017 [employer].

## 2020-12-20 NOTE — Progress Notes (Signed)
Subjective:   Kristi Carr is a 75 y.o. female who presents for Medicare Annual (Subsequent) preventive examination.  Review of Systems: N/A      I connected with the patient today by telephone and verified that I am speaking with the correct person using two identifiers. Location patient: home Location nurse: work Persons participating in the telephone visit: patient, nurse.   I discussed the limitations, risks, security and privacy concerns of performing an evaluation and management service by telephone and the availability of in person appointments. I also discussed with the patient that there may be a patient responsible charge related to this service. The patient expressed understanding and verbally consented to this telephonic visit.        Cardiac Risk Factors include: advanced age (>60mn, >>39women);hypertension;Other (see comment), Risk factor comments: hypercholesterolemia     Objective:    Today's Vitals   There is no height or weight on file to calculate BMI.  Advanced Directives 12/20/2020 08/04/2020 05/12/2020 01/11/2020 12/28/2019 12/21/2019 11/01/2019  Does Patient Have a Medical Advance Directive? Yes No No Yes Yes Yes Yes  Type of [place]of [place]. [place] will - - [place] will [place] will [place] will -  Does patient want to make changes to medical advance directive? - No - Patient declined - No - Patient declined No - Patient declined No - Patient declined No - Patient declined  Copy of [place] Chart? Yes - validated most recent copy scanned in chart (See row information) - - - - No - copy requested -  Would patient like information on creating a medical advance directive? - No - Patient declined - - - - -    Current Medications (verified) Outpatient Encounter Medications as of 12/20/2020  Medication Sig   Cholecalciferol 25 MCG  (1000 UT) tablet Take by mouth.   cholestyramine (QUESTRAN) 4 g packet TAKE 1/2 PACKET 2 TIMES A DAY AS NEEDED(LOOSE STOOL)   clobetasol cream (TEMOVATE) 0.05 % Use daily as needed.   co-enzyme Q-10 30 MG capsule Take 100 mg by mouth daily.   ergocalciferol (VITAMIN D2) 50000 UNITS capsule Take 50,000 Units by mouth every 30 (thirty) days.   ketoconazole (NIZORAL) 2 % cream Apply 1 application topically daily.   metoprolol succinate (TOPROL-XL) 50 MG 24 hr tablet TAKE 1 TABLET BY MOUTH EVERY DAY   multivitamin (THERAGRAN) per tablet Take 1 tablet by mouth daily.     Omega-3 Fatty Acids (FISH OIL) 1000 MG CPDR 1 by mouth daily   omeprazole (PRILOSEC) 40 MG capsule Take 40 mg by mouth daily.   traMADol (ULTRAM) 50 MG tablet Take 50 mg by mouth every 6 (six) hours as needed.   No facility-administered encounter medications on file as of 12/20/2020.    Allergies (verified) Clindamycin/lincomycin   History: Past Medical History:  Diagnosis Date   Anemia    Breast cancer of upper-outer quadrant of left female breast ([place]    Gallstones 08/23/03   x 2   Gastric hemorrhage due to gastric antral vascular ectasia ([facility])    s/p radiofrequency ablation    Hot flashes    Hypertension    Kidney stones    Radiation 10/14/13-12/02/13   left breast 50.4 gray, lumpectomy cavity boosted to 63 gray   Wears glasses    Past Surgical History:  Procedure Laterality Date   BREAST LUMPECTOMY WITH SENTINEL LYMPH NODE BIOPSY  09/06/13  LEFT BREAST SEED GUIDED LUMPECTOMY WITH LEFT AXILLARY SENTINEL NODE BIOPSY (Left)   CHOLECYSTECTOMY     COLONOSCOPY     PARTIAL HYSTERECTOMY  1980   ovaries left in   TONSILLECTOMY     TUBAL LIGATION     WISDOM TOOTH EXTRACTION     Family History  Problem Relation Age of Onset   Diabetes Mother    Hyperlipidemia Mother    Hypertension Mother    Cancer Mother        Breast    Breast cancer Mother    Heart attack Father    Heart disease Father    Hypertension  Sister    Heart disease Brother        CHF  EF 10%   Hypertension Brother    Gout Brother    Depression Neg Hx    Alcohol abuse Neg Hx    Drug abuse Neg Hx    Colon cancer Neg Hx    Uterine cancer Neg Hx    Ovarian cancer Neg Hx    Prostate cancer Neg Hx    Social History   Socioeconomic History   Marital status: Married    Spouse name: Not on file   Number of children: 2   Years of education: Not on file   Highest education level: Not on file  Occupational History   Occupation: Retired from [employer] in 2008  Tobacco Use   Smoking status: Never   Smokeless tobacco: Never  Vaping Use   Vaping Use: Never used  Substance and Sexual Activity   Alcohol use: No   Drug use: No   Sexual activity: Yes  Other Topics Concern   Not on file  Social History Narrative   Married 1966   2 kids   4 grand children   Retired from [employer]   Social Determinants of Radio broadcast assistant Strain: Low Risk    Difficulty of Paying Living Expenses: Not hard at all  Food Insecurity: No Food Insecurity   Worried About Charity fundraiser in the Last Year: Never true   Arboriculturist in the Last Year: Never true  Transportation Needs: No Transportation Needs   Lack of Transportation (Medical): No   Lack of Transportation (Non-Medical): No  Physical Activity: Sufficiently Active   Days of Exercise per Week: 7 days   Minutes of Exercise per Session: 30 min  Stress: No Stress Concern Present   Feeling of Stress : Not at all  Social Connections: Not on file    Tobacco Counseling Counseling given: Not Answered   Clinical Intake:  Pre-visit preparation completed: Yes  Pain : No/denies pain     Nutritional Risks: None Diabetes: No  How often do you need to have someone help you when you read instructions, pamphlets, or other written materials from your doctor or pharmacy?: 1 - Never  Diabetic: No Nutrition Risk Assessment:  Has the patient had any N/V/D within the  last 2 months?  No  Does the patient have any non-healing wounds?  No  Has the patient had any unintentional weight loss or weight gain?  No   Diabetes:  Is the patient diabetic?  No  If diabetic, was a CBG obtained today?   N/A Did the patient bring in their glucometer from home?   N/A How often do you monitor your CBG's? N/A.   Financial Strains and Diabetes Management:  Are you having any financial strains with the device, your  supplies or your medication?  N/A .  Does the patient want to be seen by Chronic Care Management for management of their diabetes?   N/A Would the patient like to be referred to a Nutritionist or for Diabetic Management?   N/A  Interpreter Needed?: No  Information entered by :: CJohnson, RN   Activities of Daily Living In your present state of health, do you have any difficulty performing the following activities: 12/20/2020  Hearing? N  Vision? N  Difficulty concentrating or making decisions? N  Walking or climbing stairs? N  Dressing or bathing? N  Doing errands, shopping? N  Preparing Food and eating ? N  Using the Toilet? N  In the past six months, have you accidently leaked urine? N  Do you have problems with loss of bowel control? N  Managing your Medications? N  Managing your Finances? N  Housekeeping or managing your Housekeeping? N  Some recent data might be hidden    Patient Care Team: [name], MD as PCP - General (Family Medicine) Paula Compton, MD as Consulting Physician (Obstetrics and Gynecology) Webb Laws, [place] (Optometry) Rolm Bookbinder, MD as Consulting Physician (General Surgery) Gery Pray, MD as Consulting Physician (Radiation Oncology) Magrinat, Virgie Dad, MD as Consulting Physician (Oncology) Richmond Campbell, MD as Consulting Physician (Gastroenterology)  Indicate any recent Medical Services you may have received from other than [facility] providers in the past year (date may be approximate).      Assessment:   This is a routine wellness examination for [place].  Hearing/Vision screen Vision Screening - Comments:: Patient gets annual eye exams   Dietary issues and exercise activities discussed: Current Exercise Habits: Home exercise routine, Type of exercise: walking, Time (Minutes): 30, Frequency (Times/Week): 7, Weekly Exercise (Minutes/Week): 210, Intensity: Moderate, Exercise limited by: None identified   Goals Addressed             This Visit's Progress    Patient Stated       12/20/2020, I will continue to walk at least 20-30 minutes daily.       Depression Screen PHQ 2/9 Scores 12/20/2020 12/28/2019 12/18/2018 12/08/2017 12/04/2016 05/23/2015 11/02/2012  PHQ - 2 Score 0 0 0 0 0 0 0  PHQ- 9 Score 0 - - 0 - - -    Fall Risk Fall Risk  12/20/2020 12/28/2019 12/18/2018 12/08/2017 12/04/2016  Falls in the past year? 0 0 0 No No  Number falls in past yr: 0 - 0 - -  Injury with Fall? 0 - 0 - -  Risk for fall due to : No Fall Risks - - - -  Follow up Falls evaluation completed;Falls prevention discussed - - - -    FALL RISK PREVENTION PERTAINING TO THE HOME:  Any stairs in or around the home? Yes  If so, are there any without handrails? No  Home free of loose throw rugs in walkways, pet beds, electrical cords, etc? Yes  Adequate lighting in your home to reduce risk of falls? Yes   ASSISTIVE DEVICES UTILIZED TO PREVENT FALLS:  Life alert? No  Use of a cane, walker or w/c? No  Grab bars in the bathroom? No  Shower chair or bench in shower? No  Elevated toilet seat or a handicapped toilet? No   TIMED UP AND GO:  Was the test performed?  N/A telephone visit .    Cognitive Function: MMSE - Mini Mental State Exam 12/20/2020 12/08/2017 12/04/2016  Orientation to time 5 5 5  Orientation to Place '5 5 5  '$ Registration '3 3 3  '$ Attention/ Calculation 5 0 0  Recall '3 3 2  '$ Language- name 2 objects - 0 0  Language- repeat '1 1 1  '$ Language- follow 3 step command - 3 3  Language- read  & follow direction - 0 0  Write a sentence - 0 0  Copy design - 0 0  Total score - 20 19  Mini Cog  Mini-Cog screen was completed. Maximum score is 22. A value of 0 denotes this part of the MMSE was not completed or the patient failed this part of the Mini-Cog screening.       Immunizations Immunization History  Administered Date(s) Administered   Influenza Whole 04/10/2013   Influenza, High Dose Seasonal PF 02/23/2018, 02/08/2019   Influenza,inj,Quad PF,6+ Mos 05/23/2015, 03/10/2017   Influenza-Unspecified 05/23/2015, 03/10/2017   PFIZER(Purple Top)SARS-COV-2 Vaccination 06/25/2019, 07/16/2019, 05/17/2020   Pneumococcal Conjugate-13 05/23/2015   Pneumococcal Polysaccharide-23 11/02/2012   Tdap 12/05/2010   Zoster Recombinat (Shingrix) 01/11/2019, 05/17/2019   Zoster, Live 06/22/2008, 01/11/2019, 05/17/2019    TDAP status: Due, Education has been provided regarding the importance of this vaccine. Advised may receive this vaccine at local pharmacy or Health Dept. Aware to provide a copy of the vaccination record if obtained from local pharmacy or Health Dept. Verbalized acceptance and understanding.  Flu Vaccine status: due Fall 2022  Pneumococcal vaccine status: Up to date  Covid-19 vaccine status: Completed 3 vaccines  Qualifies for Shingles Vaccine? Yes   Zostavax completed Yes   Shingrix Completed?: Yes  Screening Tests Health Maintenance  Topic Date Due   COVID-19 Vaccine (4 - Booster for Pfizer series) 08/15/2020   TETANUS/TDAP  12/04/2020   INFLUENZA VACCINE  12/18/2020   MAMMOGRAM  07/25/2022   COLONOSCOPY (Pts 45-17yr Insurance coverage will need to be confirmed)  04/04/2024   DEXA SCAN  Completed   Hepatitis C Screening  Completed   PNA vac Low Risk Adult  Completed   Zoster Vaccines- Shingrix  Completed   HPV VACCINES  Aged Out    Health Maintenance  Health Maintenance Due  Topic Date Due   COVID-19 Vaccine (4 - Booster for Pfizer series) 08/15/2020    TETANUS/TDAP  12/04/2020   INFLUENZA VACCINE  12/18/2020    Colorectal cancer screening: Type of screening: Colonoscopy. Completed 04/05/2019. Repeat every 5 years  Mammogram status: Completed 07/24/2020. Repeat every year  Bone Density status: due, will discuss with provider   Lung Cancer Screening: (Low Dose CT Chest recommended if Age 74-80years, 30 pack-year currently smoking OR have quit w/in 15years.) does not qualify.   Additional Screening:  Hepatitis C Screening: does qualify; Completed 12/04/2016  Vision Screening: Recommended annual ophthalmology exams for early detection of glaucoma and other disorders of the eye. Is the patient up to date with their annual eye exam?  Yes  Who is the provider or what is the name of the office in which the patient attends annual eye exams? Dr. MEinar GipIf pt is not established with a provider, would they like to be referred to a provider to establish care? No .   Dental Screening: Recommended annual dental exams for proper oral hygiene  Community Resource Referral / Chronic Care Management: CRR required this visit?  No   CCM required this visit?  No      Plan:     I have personally reviewed and noted the following in the patient's chart:   Medical and social history  Use of alcohol, tobacco or illicit drugs  Current medications and supplements including opioid prescriptions.  Functional ability and status Nutritional status Physical activity Advanced directives List of other physicians Hospitalizations, surgeries, and ER visits in previous 12 months Vitals Screenings to include cognitive, depression, and falls Referrals and appointments  In addition, I have reviewed and discussed with patient certain preventive protocols, quality metrics, and best practice recommendations. A written personalized care plan for preventive services as well as general preventive health recommendations were provided to patient.   Due to this  being a telephonic visit, the after visit summary with patients personalized plan was offered to patient via office or my-chart. Patient preferred to pick up at office at next visit or via mychart.   Andrez Grime, LPN   [id number]

## 2020-12-28 ENCOUNTER — Ambulatory Visit: Payer: Medicare Other

## 2020-12-29 ENCOUNTER — Other Ambulatory Visit: Payer: Self-pay

## 2020-12-29 ENCOUNTER — Other Ambulatory Visit (INDEPENDENT_AMBULATORY_CARE_PROVIDER_SITE_OTHER): Payer: Medicare Other

## 2020-12-29 DIAGNOSIS — D508 Other iron deficiency anemias: Secondary | ICD-10-CM | POA: Diagnosis not present

## 2020-12-29 DIAGNOSIS — I10 Essential (primary) hypertension: Secondary | ICD-10-CM | POA: Diagnosis not present

## 2020-12-29 DIAGNOSIS — E559 Vitamin D deficiency, unspecified: Secondary | ICD-10-CM

## 2020-12-29 LAB — CBC WITH DIFFERENTIAL/PLATELET
Basophils Absolute: 0 10*3/uL (ref 0.0–0.1)
Basophils Relative: 0.3 % (ref 0.0–3.0)
Eosinophils Absolute: 0.2 10*3/uL (ref 0.0–0.7)
Eosinophils Relative: 4.7 % (ref 0.0–5.0)
HCT: 32.1 % — ABNORMAL LOW (ref 36.0–46.0)
Hemoglobin: 10.4 g/dL — ABNORMAL LOW (ref 12.0–15.0)
Lymphocytes Relative: 18 % (ref 12.0–46.0)
Lymphs Abs: 0.9 10*3/uL (ref 0.7–4.0)
MCHC: 32.2 g/dL (ref 30.0–36.0)
MCV: 86.7 fl (ref 78.0–100.0)
Monocytes Absolute: 0.7 10*3/uL (ref 0.1–1.0)
Monocytes Relative: 13.5 % — ABNORMAL HIGH (ref 3.0–12.0)
Neutro Abs: 3.2 10*3/uL (ref 1.4–7.7)
Neutrophils Relative %: 63.5 % (ref 43.0–77.0)
Platelets: 299 10*3/uL (ref 150.0–400.0)
RBC: 3.71 Mil/uL — ABNORMAL LOW (ref 3.87–5.11)
RDW: 15.4 % (ref 11.5–15.5)
WBC: 5.1 10*3/uL (ref 4.0–10.5)

## 2020-12-29 LAB — COMPREHENSIVE METABOLIC PANEL
ALT: 8 U/L (ref 0–35)
AST: 14 U/L (ref 0–37)
Albumin: 3.9 g/dL (ref 3.5–5.2)
Alkaline Phosphatase: 59 U/L (ref 39–117)
BUN: 16 mg/dL (ref 6–23)
CO2: 30 mEq/L (ref 19–32)
Calcium: 9.5 mg/dL (ref 8.4–10.5)
Chloride: 105 mEq/L (ref 96–112)
Creatinine, Ser: 0.78 mg/dL (ref 0.40–1.20)
GFR: 74.91 mL/min (ref >60.00)
Glucose, Bld: 86 mg/dL (ref 70–99)
Potassium: 4.3 mEq/L (ref 3.5–5.1)
Sodium: 144 mEq/L (ref 135–145)
Total Bilirubin: 0.5 mg/dL (ref 0.2–1.2)
Total Protein: 6.9 g/dL (ref 6.0–8.3)

## 2020-12-29 LAB — LIPID PANEL
Cholesterol: 165 mg/dL (ref 0–200)
HDL: 47.4 mg/dL (ref >39.00)
LDL Cholesterol: 100 mg/dL — ABNORMAL HIGH (ref 0–99)
NonHDL: 117.56
Total CHOL/HDL Ratio: 3
Triglycerides: 88 mg/dL (ref 0.0–149.0)
VLDL: 17.6 mg/dL (ref 0.0–40.0)

## 2020-12-29 LAB — VITAMIN D 25 HYDROXY (VIT D DEFICIENCY, FRACTURES): VITD: 42.34 ng/mL (ref 30.00–100.00)

## 2020-12-29 LAB — IRON: Iron: 46 ug/dL (ref 42–145)

## 2021-01-02 ENCOUNTER — Other Ambulatory Visit: Payer: Self-pay

## 2021-01-02 ENCOUNTER — Ambulatory Visit (INDEPENDENT_AMBULATORY_CARE_PROVIDER_SITE_OTHER): Payer: Medicare Other | Admitting: Family Medicine

## 2021-01-02 ENCOUNTER — Encounter: Payer: Self-pay | Admitting: Family Medicine

## 2021-01-02 DIAGNOSIS — Z0000 Encounter for general adult medical examination without abnormal findings: Secondary | ICD-10-CM

## 2021-01-02 DIAGNOSIS — I10 Essential (primary) hypertension: Secondary | ICD-10-CM

## 2021-01-02 DIAGNOSIS — D508 Other iron deficiency anemias: Secondary | ICD-10-CM

## 2021-01-02 DIAGNOSIS — Z7189 Other specified counseling: Secondary | ICD-10-CM

## 2021-01-02 MED ORDER — OMEPRAZOLE 40 MG PO CPDR: 40.0000 mg | DELAYED_RELEASE_CAPSULE | Freq: Two times a day (BID) | ORAL | Status: DC

## 2021-01-02 MED ORDER — METOPROLOL SUCCINATE ER 50 MG PO TB24: 50.0000 mg | ORAL_TABLET | Freq: Every day | ORAL | 3 refills | Status: DC

## 2021-01-02 NOTE — Progress Notes (Signed)
This visit occurred during the SARS-CoV-2 public health emergency.  Safety protocols were in place, including screening questions prior to the visit, additional usage of staff PPE, and extensive cleaning of exam room while observing appropriate contact time as indicated for disinfecting solutions.  Her husband has memory loss and she is helping him cope with that, d/w pt.    Hepatitis C screening neg prev.   Flu encouraged  Shingles prev done PNA up to date Covid vaccine prev done Tetanus 2012 Colonoscopy 2020 Breast cancer screening up to date Advance directive- both children equally designated if patient were incapacitated.  DXA done per outside clinic 2018 with osteoporosis.   she wanted to defer, d/w pt.    H/o HTN.  No CP, SOB, BLE edema.   She feels better with SBP >130.    H/o IDA.  She has GI f/u pending.  She had had black stools at baseline.  No abd pain.  Iron level improved and HGB improved.  S/p transfusion in 11/2020.  No FCNAV.  Still with some diarrhea.    Meds, vitals, and allergies reviewed.   ROS: Per HPI unless specifically indicated in ROS section   GEN: nad, alert and oriented HEENT: ncat NECK: supple w/o LA CV: rrr PULM: ctab, no inc wob ABD: soft, +bs EXT: no edema SKIN: no acute rash  30 minutes were devoted to patient care in this encounter (this includes time spent reviewing the patient's file/history, interviewing and examining the patient, counseling/reviewing plan with patient).

## 2021-01-02 NOTE — Patient Instructions (Signed)
Don't change your meds for now.  I'll await an update from the GI clinic.  I would get a flu shot each fall.   Take care.  Glad to see you.

## 2021-01-03 NOTE — Assessment & Plan Note (Signed)
Hepatitis C screening neg prev.   Flu encouraged  Shingles prev done PNA up to date Covid vaccine prev done Tetanus 2012 Colonoscopy 2020 Breast cancer screening up to date Advance directive- both children equally designated if patient were incapacitated.  DXA done per outside clinic 2018 with osteoporosis.   she wanted to defer, d/w pt.

## 2021-01-03 NOTE — Assessment & Plan Note (Signed)
She is due to follow-up with GI.  Labs discussed with patient.   She wanted to defer bone density or other testing until she had her GI appointment.  I think that makes sense.  She will update me as needed.

## 2021-01-03 NOTE — Assessment & Plan Note (Signed)
Advance directive- both children equally designated if patient were incapacitated.  

## 2021-01-03 NOTE — Assessment & Plan Note (Signed)
Continue metoprolol.  She feels better when her systolic is above [id number].  She likely has symptoms that are anemia related, when her anemia is worse.  I would continue metoprolol as long as her systolic blood pressure is above 130 and she is not lightheaded.  Labs discussed with patient.

## 2021-02-12 ENCOUNTER — Telehealth: Payer: Self-pay | Admitting: Hematology and Oncology

## 2021-02-12 ENCOUNTER — Other Ambulatory Visit: Payer: Self-pay | Admitting: *Deleted

## 2021-02-12 NOTE — Telephone Encounter (Signed)
Scheduled per sch msg. Called and spoke with patient. Confirmed appt  

## 2021-02-15 ENCOUNTER — Other Ambulatory Visit: Payer: Self-pay

## 2021-02-15 ENCOUNTER — Inpatient Hospital Stay: Payer: Medicare Other | Attending: Adult Health

## 2021-02-15 DIAGNOSIS — C50412 Malignant neoplasm of upper-outer quadrant of left female breast: Secondary | ICD-10-CM

## 2021-02-15 DIAGNOSIS — D509 Iron deficiency anemia, unspecified: Secondary | ICD-10-CM | POA: Diagnosis not present

## 2021-02-15 DIAGNOSIS — D500 Iron deficiency anemia secondary to blood loss (chronic): Secondary | ICD-10-CM

## 2021-02-15 LAB — CBC WITH DIFFERENTIAL (CANCER CENTER ONLY)
Abs Immature Granulocytes: 0.01 10*3/uL (ref 0.00–0.07)
Basophils Absolute: 0 10*3/uL (ref 0.0–0.1)
Basophils Relative: 0 %
Eosinophils Absolute: 0.3 10*3/uL (ref 0.0–0.5)
Eosinophils Relative: 6 %
HCT: 27.9 % — ABNORMAL LOW (ref 36.0–46.0)
Hemoglobin: 8.7 g/dL — ABNORMAL LOW (ref 12.0–15.0)
Immature Granulocytes: 0 %
Lymphocytes Relative: 17 %
Lymphs Abs: 0.7 10*3/uL (ref 0.7–4.0)
MCH: 26.2 pg (ref 26.0–34.0)
MCHC: 31.2 g/dL (ref 30.0–36.0)
MCV: 84 fL (ref 80.0–100.0)
Monocytes Absolute: 0.5 10*3/uL (ref 0.1–1.0)
Monocytes Relative: 13 %
Neutro Abs: 2.5 10*3/uL (ref 1.7–7.7)
Neutrophils Relative %: 64 %
Platelet Count: 327 10*3/uL (ref 150–400)
RBC: 3.32 MIL/uL — ABNORMAL LOW (ref 3.87–5.11)
RDW: 15.5 % (ref 11.5–15.5)
WBC Count: 4 10*3/uL (ref 4.0–10.5)
nRBC: 0 % (ref 0.0–0.2)

## 2021-02-15 LAB — SAMPLE TO BLOOD BANK

## 2021-02-15 LAB — FERRITIN: Ferritin: 33 ng/mL (ref 11–307)

## 2021-02-20 ENCOUNTER — Telehealth: Payer: Self-pay | Admitting: Oncology

## 2021-02-20 NOTE — Telephone Encounter (Signed)
Sch per 9/30 inbasket, pt aware

## 2021-02-21 ENCOUNTER — Other Ambulatory Visit: Payer: Self-pay | Admitting: *Deleted

## 2021-02-21 ENCOUNTER — Other Ambulatory Visit: Payer: Self-pay | Admitting: Oncology

## 2021-02-21 DIAGNOSIS — D508 Other iron deficiency anemias: Secondary | ICD-10-CM

## 2021-02-22 ENCOUNTER — Inpatient Hospital Stay: Payer: Medicare Other | Attending: Adult Health

## 2021-02-22 ENCOUNTER — Other Ambulatory Visit: Payer: Self-pay | Admitting: *Deleted

## 2021-02-22 ENCOUNTER — Other Ambulatory Visit: Payer: Self-pay

## 2021-02-22 DIAGNOSIS — D508 Other iron deficiency anemias: Secondary | ICD-10-CM

## 2021-02-22 DIAGNOSIS — D509 Iron deficiency anemia, unspecified: Secondary | ICD-10-CM | POA: Diagnosis not present

## 2021-02-22 LAB — CBC WITH DIFFERENTIAL (CANCER CENTER ONLY)
Abs Immature Granulocytes: 0.01 10*3/uL (ref 0.00–0.07)
Basophils Absolute: 0 10*3/uL (ref 0.0–0.1)
Basophils Relative: 0 %
Eosinophils Absolute: 0.3 10*3/uL (ref 0.0–0.5)
Eosinophils Relative: 6 %
HCT: 27.5 % — ABNORMAL LOW (ref 36.0–46.0)
Hemoglobin: 8.1 g/dL — ABNORMAL LOW (ref 12.0–15.0)
Immature Granulocytes: 0 %
Lymphocytes Relative: 19 %
Lymphs Abs: 0.9 10*3/uL (ref 0.7–4.0)
MCH: 24.8 pg — ABNORMAL LOW (ref 26.0–34.0)
MCHC: 29.5 g/dL — ABNORMAL LOW (ref 30.0–36.0)
MCV: 84.1 fL (ref 80.0–100.0)
Monocytes Absolute: 0.6 10*3/uL (ref 0.1–1.0)
Monocytes Relative: 15 %
Neutro Abs: 2.6 10*3/uL (ref 1.7–7.7)
Neutrophils Relative %: 60 %
Platelet Count: 361 10*3/uL (ref 150–400)
RBC: 3.27 MIL/uL — ABNORMAL LOW (ref 3.87–5.11)
RDW: 15.8 % — ABNORMAL HIGH (ref 11.5–15.5)
WBC Count: 4.4 10*3/uL (ref 4.0–10.5)
nRBC: 0 % (ref 0.0–0.2)

## 2021-02-22 LAB — SAMPLE TO BLOOD BANK

## 2021-02-22 NOTE — Progress Notes (Signed)
Pt to receive 1 unit blood due to gastric bleed and IV iron on Saturday 02/24/2021.

## 2021-02-23 ENCOUNTER — Other Ambulatory Visit: Payer: Self-pay | Admitting: *Deleted

## 2021-02-23 DIAGNOSIS — D508 Other iron deficiency anemias: Secondary | ICD-10-CM

## 2021-02-23 LAB — PREPARE RBC (CROSSMATCH)

## 2021-02-23 LAB — FERRITIN: Ferritin: 12 ng/mL (ref 11–307)

## 2021-02-23 MED FILL — Iron Sucrose Inj 20 MG/ML (Fe Equiv): INTRAVENOUS | Qty: 20 | Status: AC

## 2021-02-24 ENCOUNTER — Inpatient Hospital Stay: Payer: Medicare Other

## 2021-02-24 ENCOUNTER — Other Ambulatory Visit: Payer: Self-pay

## 2021-02-24 VITALS — BP 136/62 | HR 62 | Temp 97.9°F | Resp 16

## 2021-02-24 DIAGNOSIS — D509 Iron deficiency anemia, unspecified: Secondary | ICD-10-CM | POA: Diagnosis not present

## 2021-02-24 DIAGNOSIS — D508 Other iron deficiency anemias: Secondary | ICD-10-CM

## 2021-02-24 MED ORDER — ACETAMINOPHEN 325 MG PO TABS
650.0000 mg | ORAL_TABLET | Freq: Once | ORAL | Status: AC
  Administered 2021-02-24: 650 mg via ORAL

## 2021-02-24 MED ORDER — SODIUM CHLORIDE 0.9 % IV SOLN: INTRAVENOUS | Status: DC

## 2021-02-24 MED ORDER — SODIUM CHLORIDE 0.9% IV SOLUTION
250.0000 mL | Freq: Once | INTRAVENOUS | Status: AC
Start: 2021-02-24 — End: 2021-02-24
  Administered 2021-02-24: 250 mL via INTRAVENOUS

## 2021-02-24 MED ORDER — SODIUM CHLORIDE 0.9 % IV SOLN
400.0000 mg | Freq: Once | INTRAVENOUS | Status: AC
  Administered 2021-02-24: 400 mg via INTRAVENOUS
  Filled 2021-02-24: qty 20

## 2021-02-24 MED ORDER — DIPHENHYDRAMINE HCL 25 MG PO CAPS
25.0000 mg | ORAL_CAPSULE | Freq: Once | ORAL | Status: AC
  Administered 2021-02-24: 25 mg via ORAL

## 2021-02-24 MED ORDER — DIPHENHYDRAMINE HCL 25 MG PO CAPS
ORAL_CAPSULE | ORAL | Status: AC
  Filled 2021-02-24: qty 2

## 2021-02-24 MED ORDER — ACETAMINOPHEN 325 MG PO TABS
ORAL_TABLET | ORAL | Status: AC
  Filled 2021-02-24: qty 2

## 2021-02-26 LAB — BPAM RBC
Blood Product Expiration Date: 202210242359
ISSUE DATE / TIME: 202210081105
Unit Type and Rh: 6200

## 2021-02-26 LAB — TYPE AND SCREEN
ABO/RH(D): A POS
Antibody Screen: NEGATIVE
Unit division: 0

## 2021-02-27 ENCOUNTER — Other Ambulatory Visit: Payer: Self-pay | Admitting: Oncology

## 2021-03-05 ENCOUNTER — Inpatient Hospital Stay: Payer: Medicare Other

## 2021-03-05 ENCOUNTER — Ambulatory Visit: Payer: Medicare Other

## 2021-03-05 ENCOUNTER — Other Ambulatory Visit: Payer: Self-pay

## 2021-03-05 VITALS — BP 168/85 | HR 65 | Temp 98.4°F | Resp 16

## 2021-03-05 DIAGNOSIS — D508 Other iron deficiency anemias: Secondary | ICD-10-CM

## 2021-03-05 DIAGNOSIS — D509 Iron deficiency anemia, unspecified: Secondary | ICD-10-CM | POA: Diagnosis not present

## 2021-03-05 MED ORDER — SODIUM CHLORIDE 0.9 % IV SOLN
400.0000 mg | Freq: Once | INTRAVENOUS | Status: AC
  Administered 2021-03-05: 400 mg via INTRAVENOUS
  Filled 2021-03-05: qty 20

## 2021-03-05 MED ORDER — SODIUM CHLORIDE 0.9 % IV SOLN: Freq: Once | INTRAVENOUS | Status: AC

## 2021-03-05 NOTE — Progress Notes (Signed)
Pt declined 30 minute post Venofer infusion observation.  RN educated pt to call the cancer center with any concerns or questions.  Pt verbalized understanding and stated she felt great, but would call if needed.  Pt tolerated treatment well. VSS at discharge. Pt stated she was following up with her PCP regarding her elevated BP tomorrow (03/06/21). Ambulatory to lobby.

## 2021-03-05 NOTE — Patient Instructions (Signed)

## 2021-03-06 ENCOUNTER — Telehealth: Payer: Self-pay | Admitting: Family Medicine

## 2021-03-06 NOTE — Telephone Encounter (Signed)
[facility] - Client TELEPHONE ADVICE RECORD AccessNurse Patient Name: NINETTA ADELSTEIN Gender: Female DOB: 1945-11-18 Age: 75 Y 3 M 8 D Return Phone Number: [phone] (Primary), [phone] (Secondary) Address[place]  [address] Client [facility] - Client Client Site [place] Physician Renford Dills - MD Contact Type Call Who Is Calling Patient / Member / Family / Caregiver Call Type Triage / Clinical Relationship To Patient Self Return Phone Number [phone] (Primary) Chief Complaint Blood Pressure High Reason for Call Symptomatic / Request for [place] states, pt having high bp. Pt bp 160/92 Translation No Nurse Assessment Nurse: Lucky Cowboy, RN, Levada Dy Date/Time (Eastern Time): 03/06/2021 9:27:59 AM Confirm and document reason for call. If symptomatic, describe symptoms. ---Caller stated that her BP has been up 150s/90s. 160/92 this morning. She took another 1/2 dose last night Metoprolol. She takes it in the morning 50 mg every morning. No edema. She had her iron infusion yesterday. Does the patient have any new or worsening symptoms? ---Yes Will a triage be completed? ---Yes Related visit to physician within the last 2 weeks? ---Yes Does the PT have any chronic conditions? (i.e. diabetes, asthma, this includes High risk factors for pregnancy, etc.) ---Yes List chronic conditions. ---bleeding disorder, iron infusions, HTN Is this a behavioral health or substance abuse call? ---No Guidelines Guideline Title Affirmed Question Affirmed Notes Nurse Date/Time (Eastern Time) Blood Pressure - High Systolic BP >= 676 OR Diastolic >= 720 Dew, RN, Levada Dy 03/06/2021 9:30:29 AM Disp. Time [name] Time) Disposition Final User 03/06/2021 9:33:11 AM SEE PCP WITHIN 3 DAYS Yes Dew, RN, Levada Dy PLEASE NOTE: All timestamps contained  within this report are represented as [place] Standard Time. CONFIDENTIALTY NOTICE: This fax transmission is intended only for the addressee. It contains information that is legally privileged, confidential or otherwise protected from use or disclosure. If you are not the intended recipient, you are strictly prohibited from reviewing, disclosing, copying using or disseminating any of this information or taking any action in reliance on or regarding this information. If you have received this fax in error, please notify us immediately by telephone so that we can arrange for its return to [place]. Phone: [phone], Toll-Free: [phone], Fax: [phone] Page: 2 of 2 Call Id: [id number] [place] Disagree/Comply Comply Caller Understands Yes PreDisposition Call Doctor Care Advice Given Per Guideline SEE PCP WITHIN 3 DAYS: * You need to be seen within 2 or 3 days. * PCP VISIT: Call your doctor (or NP/PA) during regular office hours and make an appointment. A clinic or urgent care center are good places to go for care if your doctor's office is closed or you can't get an appointment. NOTE: If office will be open tomorrow, tell caller to call then, not in 3 days. HIGH BLOOD PRESSURE - LIFESTYLE MODIFICATIONS: * The following things can help you reduce your blood pressure. * EAT HEALTHY: Eat a diet rich in fresh fruits and vegetables, dietary fiber, non-animal protein (e.g., soy), and low-fat dairy products. Avoid foods with a high content of saturated fat or cholesterol. HIGH BLOOD PRESSURE: * Untreated high blood pressure may cause damage to your heart, brain, kidneys, and eyes. * Treatment of high blood pressure can reduce the risk of stroke, heart attack, and heart failure. CALL BACK IF: * Weakness or numbness of the face, arm or leg on one side of the body occurs * Difficulty walking, difficulty talking,  or severe headache occurs * Chest pain or difficulty breathing occurs * Your blood pressure is  over 180/110 * You become worse CARE ADVICE given per High Blood Pressure (Adult) guideline. Referrals REFERRED TO PCP OFFIC

## 2021-03-06 NOTE — Telephone Encounter (Signed)
Per appt notes pt already has appt with Dr Damita Dunnings 03/09/21. Pt advice was given at end of access note. Sending to Dr Damita Dunnings and Janett Billow CMA.

## 2021-03-07 NOTE — Telephone Encounter (Signed)
Noted. Thanks.

## 2021-03-08 ENCOUNTER — Encounter: Payer: Self-pay | Admitting: Family Medicine

## 2021-03-09 ENCOUNTER — Ambulatory Visit: Payer: Medicare Other | Admitting: Family Medicine

## 2021-03-12 ENCOUNTER — Other Ambulatory Visit: Payer: Self-pay

## 2021-03-12 ENCOUNTER — Inpatient Hospital Stay: Payer: Medicare Other

## 2021-03-12 VITALS — BP 156/74 | HR 69 | Temp 98.4°F | Resp 16

## 2021-03-12 DIAGNOSIS — D508 Other iron deficiency anemias: Secondary | ICD-10-CM

## 2021-03-12 DIAGNOSIS — D509 Iron deficiency anemia, unspecified: Secondary | ICD-10-CM | POA: Diagnosis not present

## 2021-03-12 MED ORDER — SODIUM CHLORIDE 0.9 % IV SOLN
400.0000 mg | Freq: Once | INTRAVENOUS | Status: AC
  Administered 2021-03-12: 400 mg via INTRAVENOUS
  Filled 2021-03-12: qty 20

## 2021-03-12 NOTE — Patient Instructions (Signed)

## 2021-03-12 NOTE — Progress Notes (Signed)
Patient refused to stay for 30 minute post infusion observation. Discharged with stable vital signs. No s/s of distress.

## 2021-03-13 ENCOUNTER — Ambulatory Visit (INDEPENDENT_AMBULATORY_CARE_PROVIDER_SITE_OTHER): Payer: Medicare Other | Admitting: Family Medicine

## 2021-03-13 DIAGNOSIS — I10 Essential (primary) hypertension: Secondary | ICD-10-CM | POA: Diagnosis not present

## 2021-03-13 MED ORDER — METOPROLOL SUCCINATE ER 50 MG PO TB24: 50.0000 mg | ORAL_TABLET | Freq: Every day | ORAL | Status: DC

## 2021-03-13 NOTE — Progress Notes (Signed)
This visit occurred during the SARS-CoV-2 public health emergency.  Safety protocols were in place, including screening questions prior to the visit, additional usage of staff PPE, and extensive cleaning of exam room while observing appropriate contact time as indicated for disinfecting solutions.  Hypertension:    Using medication without problems or lightheadedness: yes Chest pain with exertion:no Edema:none usually, occ minimal L ankle edema at the end of the day Short of breath:no She had her iron infusion and a transfusion.  She clearly feels better.   She can feel when her BP is low- she has awareness of that, when present.    Pulse usually 70s.  SBP usually 120-170s DBP usually 70-90  We talked about trying to limit hypotension, drinking enough fluid. She occ takes an extra 1/2 tab of metoprolol.    She has f/u GI ablation pending.    She is caring for her husband. His memory is gradually getting worse.  He gets fixated on checking the oil in her car.  He is no longer driving and doesn't have access to keys.  This all may contribute to her BP elevation, d/w pt.    Meds, vitals, and allergies reviewed.  ROS: Per HPI unless specifically indicated in ROS section   GEN: nad, alert and oriented HEENT: ncat NECK: supple w/o LA CV: rrr. PULM: ctab, no inc wob ABD: soft, +bs EXT: no edema SKIN: Well-perfused.

## 2021-03-13 NOTE — Patient Instructions (Signed)
Take an extra 1/2 tab of metoprolol if needed (make sure your pulse stays above 60 with the higher dose).  Drink enough fluid to keep your urine clear.   Take care.  Glad to see you.

## 2021-03-14 NOTE — Assessment & Plan Note (Addendum)
Situation is complicated by her volume status.  She has a history of gastric ablation/iron infusions/transfusions.  The goal is to avoid hypotension.  We talked about using an extra half tablet of metoprolol if her blood pressure is above 150/90.  Otherwise she will continue with 1 tablet (50 mg) daily.  She will update me as needed.  See after visit summary.  We talked about trying not to induce bradycardia with a higher dose of beta-blocker.  We talked about the stressors caring for her husband and she is going to update me as needed.

## 2021-06-07 ENCOUNTER — Other Ambulatory Visit: Payer: Self-pay | Admitting: *Deleted

## 2021-06-07 DIAGNOSIS — K31811 Angiodysplasia of stomach and duodenum with bleeding: Secondary | ICD-10-CM

## 2021-06-08 ENCOUNTER — Other Ambulatory Visit: Payer: Self-pay | Admitting: *Deleted

## 2021-06-08 ENCOUNTER — Other Ambulatory Visit: Payer: Self-pay

## 2021-06-08 ENCOUNTER — Inpatient Hospital Stay: Payer: Medicare Other | Attending: Adult Health

## 2021-06-08 DIAGNOSIS — D508 Other iron deficiency anemias: Secondary | ICD-10-CM

## 2021-06-08 DIAGNOSIS — K31811 Angiodysplasia of stomach and duodenum with bleeding: Secondary | ICD-10-CM

## 2021-06-08 DIAGNOSIS — D509 Iron deficiency anemia, unspecified: Secondary | ICD-10-CM | POA: Insufficient documentation

## 2021-06-08 LAB — CMP (CANCER CENTER ONLY)
ALT: 7 U/L (ref 0–44)
AST: 12 U/L — ABNORMAL LOW (ref 15–41)
Albumin: 4.1 g/dL (ref 3.5–5.0)
Alkaline Phosphatase: 54 U/L (ref 38–126)
Anion gap: 8 (ref 5–15)
BUN: 14 mg/dL (ref 8–23)
CO2: 31 mmol/L (ref 22–32)
Calcium: 9.5 mg/dL (ref 8.9–10.3)
Chloride: 105 mmol/L (ref 98–111)
Creatinine: 0.7 mg/dL (ref 0.44–1.00)
GFR, Estimated: >60 mL/min (ref >60)
Glucose, Bld: 98 mg/dL (ref 70–99)
Potassium: 3.6 mmol/L (ref 3.5–5.1)
Sodium: 144 mmol/L (ref 135–145)
Total Bilirubin: 1 mg/dL (ref 0.3–1.2)
Total Protein: 7.2 g/dL (ref 6.5–8.1)

## 2021-06-08 LAB — CBC WITH DIFFERENTIAL (CANCER CENTER ONLY)
Abs Immature Granulocytes: 0.02 10*3/uL (ref 0.00–0.07)
Basophils Absolute: 0 10*3/uL (ref 0.0–0.1)
Basophils Relative: 0 %
Eosinophils Absolute: 0.2 10*3/uL (ref 0.0–0.5)
Eosinophils Relative: 3 %
HCT: 35.8 % — ABNORMAL LOW (ref 36.0–46.0)
Hemoglobin: 11.3 g/dL — ABNORMAL LOW (ref 12.0–15.0)
Immature Granulocytes: 0 %
Lymphocytes Relative: 12 %
Lymphs Abs: 0.7 10*3/uL (ref 0.7–4.0)
MCH: 29.4 pg (ref 26.0–34.0)
MCHC: 31.6 g/dL (ref 30.0–36.0)
MCV: 93 fL (ref 80.0–100.0)
Monocytes Absolute: 0.5 10*3/uL (ref 0.1–1.0)
Monocytes Relative: 9 %
Neutro Abs: 4.4 10*3/uL (ref 1.7–7.7)
Neutrophils Relative %: 76 %
Platelet Count: 249 10*3/uL (ref 150–400)
RBC: 3.85 MIL/uL — ABNORMAL LOW (ref 3.87–5.11)
RDW: 15.1 % (ref 11.5–15.5)
WBC Count: 5.7 10*3/uL (ref 4.0–10.5)
nRBC: 0 % (ref 0.0–0.2)

## 2021-06-08 LAB — SAMPLE TO BLOOD BANK

## 2021-06-08 LAB — FERRITIN: Ferritin: 198 ng/mL (ref 11–307)

## 2021-07-18 ENCOUNTER — Other Ambulatory Visit: Payer: Self-pay

## 2021-07-18 ENCOUNTER — Encounter (HOSPITAL_COMMUNITY): Payer: Self-pay

## 2021-07-18 ENCOUNTER — Emergency Department (HOSPITAL_COMMUNITY): Payer: Medicare Other

## 2021-07-18 ENCOUNTER — Emergency Department (HOSPITAL_COMMUNITY)
Admission: EM | Admit: 2021-07-18 | Discharge: 2021-07-18 | Disposition: A | Discharge status: Home / Self Care | Payer: Medicare Other | Attending: Emergency Medicine | Admitting: Emergency Medicine

## 2021-07-18 DIAGNOSIS — R112 Nausea with vomiting, unspecified: Secondary | ICD-10-CM | POA: Diagnosis not present

## 2021-07-18 DIAGNOSIS — R197 Diarrhea, unspecified: Secondary | ICD-10-CM | POA: Diagnosis not present

## 2021-07-18 DIAGNOSIS — A09 Infectious gastroenteritis and colitis, unspecified: Secondary | ICD-10-CM

## 2021-07-18 DIAGNOSIS — Z79899 Other long term (current) drug therapy: Secondary | ICD-10-CM | POA: Insufficient documentation

## 2021-07-18 DIAGNOSIS — R1084 Generalized abdominal pain: Secondary | ICD-10-CM | POA: Diagnosis present

## 2021-07-18 LAB — CBC WITH DIFFERENTIAL/PLATELET
Abs Immature Granulocytes: 0.06 10*3/uL (ref 0.00–0.07)
Basophils Absolute: 0 10*3/uL (ref 0.0–0.1)
Basophils Relative: 0 %
Eosinophils Absolute: 0 10*3/uL (ref 0.0–0.5)
Eosinophils Relative: 0 %
HCT: 33.9 % — ABNORMAL LOW (ref 36.0–46.0)
Hemoglobin: 10.8 g/dL — ABNORMAL LOW (ref 12.0–15.0)
Immature Granulocytes: 1 %
Lymphocytes Relative: 2 %
Lymphs Abs: 0.2 10*3/uL — ABNORMAL LOW (ref 0.7–4.0)
MCH: 29.9 pg (ref 26.0–34.0)
MCHC: 31.9 g/dL (ref 30.0–36.0)
MCV: 93.9 fL (ref 80.0–100.0)
Monocytes Absolute: 0 10*3/uL — ABNORMAL LOW (ref 0.1–1.0)
Monocytes Relative: 0 %
Neutro Abs: 9.3 10*3/uL — ABNORMAL HIGH (ref 1.7–7.7)
Neutrophils Relative %: 97 %
Platelets: 182 10*3/uL (ref 150–400)
RBC: 3.61 MIL/uL — ABNORMAL LOW (ref 3.87–5.11)
RDW: 14 % (ref 11.5–15.5)
WBC: 9.7 10*3/uL (ref 4.0–10.5)
nRBC: 0 % (ref 0.0–0.2)

## 2021-07-18 LAB — COMPREHENSIVE METABOLIC PANEL
ALT: 23 U/L (ref 0–44)
AST: 34 U/L (ref 15–41)
Albumin: 3.5 g/dL (ref 3.5–5.0)
Alkaline Phosphatase: 51 U/L (ref 38–126)
Anion gap: 7 (ref 5–15)
BUN: 17 mg/dL (ref 8–23)
CO2: 26 mmol/L (ref 22–32)
Calcium: 8.5 mg/dL — ABNORMAL LOW (ref 8.9–10.3)
Chloride: 102 mmol/L (ref 98–111)
Creatinine, Ser: 0.77 mg/dL (ref 0.44–1.00)
GFR, Estimated: >60 mL/min (ref >60)
Glucose, Bld: 110 mg/dL — ABNORMAL HIGH (ref 70–99)
Potassium: 3 mmol/L — ABNORMAL LOW (ref 3.5–5.1)
Sodium: 135 mmol/L (ref 135–145)
Total Bilirubin: 0.9 mg/dL (ref 0.3–1.2)
Total Protein: 6.4 g/dL — ABNORMAL LOW (ref 6.5–8.1)

## 2021-07-18 LAB — URINALYSIS, ROUTINE W REFLEX MICROSCOPIC
Bilirubin Urine: NEGATIVE
Glucose, UA: NEGATIVE mg/dL
Ketones, ur: NEGATIVE mg/dL
Leukocytes,Ua: NEGATIVE
Nitrite: NEGATIVE
Protein, ur: NEGATIVE mg/dL
Specific Gravity, Urine: 1.009 (ref 1.005–1.030)
pH: 5 (ref 5.0–8.0)

## 2021-07-18 LAB — POC OCCULT BLOOD, ED: Fecal Occult Bld: POSITIVE — AB

## 2021-07-18 LAB — TYPE AND SCREEN
ABO/RH(D): A POS
Antibody Screen: NEGATIVE

## 2021-07-18 LAB — LIPASE, BLOOD: Lipase: 51 U/L (ref 11–51)

## 2021-07-18 LAB — LACTIC ACID, PLASMA
Lactic Acid, Venous: 2.7 mmol/L (ref 0.5–1.9)
Lactic Acid, Venous: 1 mmol/L (ref 0.5–1.9)

## 2021-07-18 MED ORDER — ACETAMINOPHEN 650 MG RE SUPP: 650.0000 mg | Freq: Once | RECTAL | Status: AC

## 2021-07-18 MED ORDER — SODIUM CHLORIDE 0.9 % IV BOLUS
500.0000 mL | Freq: Once | INTRAVENOUS | Status: AC
  Administered 2021-07-18: 500 mL via INTRAVENOUS

## 2021-07-18 MED ORDER — IOHEXOL 350 MG/ML SOLN
80.0000 mL | Freq: Once | INTRAVENOUS | Status: AC | PRN
  Administered 2021-07-18: 80 mL via INTRAVENOUS

## 2021-07-18 MED ORDER — ACETAMINOPHEN 650 MG RE SUPP
RECTAL | Status: AC
  Administered 2021-07-18: 650 mg via RECTAL
  Filled 2021-07-18: qty 1

## 2021-07-18 NOTE — ED Triage Notes (Signed)
Pt. BIB [facility] from home c/o abdominal pain, NVD since midnight. EMS states that the patient has explosive diarrhea. She has a hx of breast cancer on the left side. She was given 4 mg Zofran en route and 500 cc NS. Pt. Presented positive for orthostatics for EMS with around a 20 point drop. ? ?EMS VS: ?BP: 152/71 ?HR: 87 ?RR:16 ?O2: 124 ?Temp: 97.9  ?

## 2021-07-18 NOTE — Discharge Instructions (Signed)
Drink plenty of fluids.  Do not take your blood pressure medicine until you see your doctor this week.  Make an appointment for recheck by your doctor this week and return here if any problem ?

## 2021-07-18 NOTE — ED Provider Notes (Signed)
[facility] DEPT Provider Note   CSN: [id number] Arrival date & time: 07/18/21  0446     History  Chief Complaint  Patient presents with   Abdominal Pain    Kristi Carr is a 76 y.o. female.  Patient presents to the emergency department for evaluation of abdominal pain.  Patient reports that she began to have nausea, vomiting and diarrhea around midnight tonight.  She is complaining of diffuse abdominal pain which seems to have improved during transport with administration of Zofran and IV fluids.  Patient reports that she has a history of continuous GI bleeding requiring intermittent transfusions.  She did not notice if there was any blood in her vomit or stool tonight.  EMS reports that the patient was orthostatic for them, administered fluids during transport.      Home Medications Prior to Admission medications   Medication Sig Start Date End Date Taking? Authorizing Provider  clobetasol cream (TEMOVATE) 0.05 % Use daily as needed. 03/10/17   [name], MD  diphenoxylate-atropine (LOMOTIL) 2.5-0.025 MG tablet Take by mouth 4 (four) times daily as needed for diarrhea or loose stools.    [provider]  ketoconazole (NIZORAL) 2 % cream Apply 1 application topically daily. 01/09/18   Magrinat, Virgie Dad, MD  metoprolol succinate (TOPROL-XL) 50 MG 24 hr tablet Take 1 tablet (50 mg total) by mouth daily. Take with or immediately following a meal.  Take an extra 1/2 tab daily if needed if BP is >150/>90 03/13/21   [name], MD  omeprazole (PRILOSEC) 40 MG capsule Take 1 capsule (40 mg total) by mouth 2 (two) times daily. 01/02/21   [name], MD      Allergies    Clindamycin/lincomycin    Review of Systems   Review of Systems  Gastrointestinal:  Positive for abdominal pain, diarrhea, nausea and vomiting.   Physical Exam Updated Vital Signs BP (!) 106/50    Pulse 76    Temp 99.3 F (37.4 C)    Resp 18    SpO2 100%   Physical Exam Vitals and nursing note reviewed.  Constitutional:      General: She is not in acute distress.    Appearance: She is well-developed.  HENT:     Head: Normocephalic and atraumatic.     Mouth/Throat:     Mouth: Mucous membranes are moist.  Eyes:     General: Vision grossly intact. Gaze aligned appropriately.     Extraocular Movements: Extraocular movements intact.     Conjunctiva/sclera: Conjunctivae normal.  Cardiovascular:     Rate and Rhythm: Normal rate and regular rhythm.     Pulses: Normal pulses.     Heart sounds: Normal heart sounds, S1 normal and S2 normal. No murmur heard.   No friction rub. No gallop.  Pulmonary:     Effort: Pulmonary effort is normal. No respiratory distress.     Breath sounds: Normal breath sounds.  Abdominal:     General: Bowel sounds are normal.     Palpations: Abdomen is soft.     Tenderness: There is generalized abdominal tenderness. There is no guarding or rebound.     Hernia: No hernia is present.  Musculoskeletal:        General: No swelling.     Cervical back: Full passive range of motion without pain, normal range of motion and neck supple. No spinous process tenderness or muscular tenderness. Normal range of motion.     Right  lower leg: No edema.     Left lower leg: No edema.  Skin:    General: Skin is warm and dry.     Capillary Refill: Capillary refill takes less than 2 seconds.     Findings: No ecchymosis, erythema, rash or wound.  Neurological:     General: No focal deficit present.     Mental Status: She is alert and oriented to person, place, and time.     GCS: GCS eye subscore is 4. GCS verbal subscore is 5. GCS motor subscore is 6.     Cranial Nerves: Cranial nerves 2-12 are intact.     Sensory: Sensation is intact.     Motor: Motor function is intact.     Coordination: Coordination is intact.  Psychiatric:        Attention and Perception: Attention normal.        Mood and Affect: Mood normal.        Speech:  Speech normal.        Behavior: Behavior normal.    ED Results / Procedures / Treatments   Labs (all labs ordered are listed, but only abnormal results are displayed) Labs Reviewed  CBC WITH DIFFERENTIAL/PLATELET - Abnormal; Notable for the following components:      Result Value   RBC 3.61 (*)    Hemoglobin 10.8 (*)    HCT 33.9 (*)    Neutro Abs 9.3 (*)    Lymphs Abs 0.2 (*)    Monocytes Absolute 0.0 (*)    All other components within normal limits  COMPREHENSIVE METABOLIC PANEL - Abnormal; Notable for the following components:   Potassium 3.0 (*)    Glucose, Bld 110 (*)    Calcium 8.5 (*)    Total Protein 6.4 (*)    All other components within normal limits  LACTIC ACID, PLASMA - Abnormal; Notable for the following components:   Lactic Acid, Venous 2.7 (*)    All other components within normal limits  URINALYSIS, ROUTINE W REFLEX MICROSCOPIC - Abnormal; Notable for the following components:   Color, Urine STRAW (*)    Hgb urine dipstick MODERATE (*)    Bacteria, UA RARE (*)    All other components within normal limits  POC OCCULT BLOOD, ED - Abnormal; Notable for the following components:   Fecal Occult Bld POSITIVE (*)    All other components within normal limits  LIPASE, BLOOD  LACTIC ACID, PLASMA  TYPE AND SCREEN    EKG EKG Interpretation  Date/Time:  Wednesday July 18 2021 06:33:32 EST Ventricular Rate:  97 PR Interval:  149 QRS Duration: 94 QT Interval:  360 QTC Calculation: 458 R Axis:   -10 Text Interpretation: Sinus rhythm Consider right atrial enlargement Low voltage, precordial leads No significant change since last tracing Confirmed by Deno Etienne [phone]) on 07/19/2021 3:16:03 PM  Radiology CT ANGIO GI BLEED  Result Date: 07/18/2021 CLINICAL DATA:  76 year old with abdominal pain. Evaluate for mesenteric ischemia, acute. EXAM: CTA ABDOMEN AND PELVIS WITHOUT AND WITH CONTRAST TECHNIQUE: Multidetector CT imaging of the abdomen and pelvis was performed  using the standard protocol during bolus administration of intravenous contrast. Multiplanar reconstructed images and MIPs were obtained and reviewed to evaluate the vascular anatomy. RADIATION DOSE REDUCTION: This exam was performed according to the departmental dose-optimization program which includes automated exposure control, adjustment of the mA and/or kV according to patient size and/or use of iterative reconstruction technique. CONTRAST:  3mL OMNIPAQUE IOHEXOL 350 MG/ML SOLN COMPARISON:  10/19/2003 FINDINGS:  VASCULAR Aorta: Atherosclerotic calcifications in the abdominal aorta without aneurysm, dissection or significant stenosis. Celiac: Patent without evidence of aneurysm, dissection, vasculitis or significant stenosis. SMA: Patent without evidence of aneurysm, dissection, vasculitis or significant stenosis. Renals: Single left renal artery is widely patent. Main right renal artery is widely patent. There is an accessory right renal artery. No evidence for aneurysm or dissection involving the renal arteries. IMA: Patent without evidence of aneurysm, dissection, vasculitis or significant stenosis. Inflow: Right common, internal and external iliac arteries are widely patent. There appears to be a small saccular aneurysm involving proximal right internal iliac artery at branch point on sequence 7, image 148. This small saccular aneurysm measures roughly 7 mm. Left common, internal and external iliac arteries are widely patent. Proximal Outflow: Proximal femoral arteries are widely patent. Veins: Hepatic veins and portal veins are patent. No gross abnormality to the IVC or iliac veins. Review of the MIP images confirms the above findings. NON-VASCULAR Lower chest: 4 mm nodule in the right middle lobe on sequence 4 image 14. Tiny calcified granuloma in the right lower lobe on image 24, sequence 4. Patchy dependent densities at the lung bases are suggestive for atelectasis. Multiple surgical clips in the left  breast. Hepatobiliary: Cholecystectomy. Mild periportal edema versus minimal intrahepatic biliary dilatation. Common bile duct is not dilated. Subtle hypodensity near the right hepatic dome on sequence 13, image 8 is nonspecific. Focal enhancement in central aspect of the liver on sequence 7 image 31 is nonspecific and likely incidental vascular lesion or perfusion abnormality. Pancreas: Unremarkable. No pancreatic ductal dilatation or surrounding inflammatory changes. Spleen: Normal in size without focal abnormality. Adrenals/Urinary Tract: Limited evaluation of the adrenal glands. There is no significant adrenal enlargement. Round low-density structure along the right kidney upper pole probably represents a renal cyst measuring up to 1.5 cm. No hydronephrosis. Mild perinephric stranding or edema along the posterior aspect of both kidneys which is nonspecific. Negative for kidney stones. No evidence for a ureter stone. Urinary bladder is mildly distended. Stomach/Bowel: Severe diverticulosis in the sigmoid colon. Cecum is located in the right hemipelvis. There appears to be a normal appendix containing gas. No evidence for bowel dilatation or focal bowel inflammation. There is no evidence for active GI bleeding. Lymphatic: No lymph node enlargement in the abdomen or pelvis. Reproductive: Status post hysterectomy. No adnexal masses. Other: Negative for free fluid or free air. Musculoskeletal: Disc space narrowing at L4-L5. No acute bone abnormality. IMPRESSION: VASCULAR 1. Visceral arteries in the abdomen and pelvis are widely patent. No evidence for acute or chronic mesenteric ischemia. 2. 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 Recommend follow-up CTA in 6-12 months to ensure stability. 3.  Aortic Atherosclerosis (ICD10-I70.0). NON-VASCULAR 1. No evidence for active GI bleeding. 2. Extensive colonic diverticulosis without acute inflammation. 3. Perinephric edema or  stranding along the posteroinferior aspect of both kidneys. Findings are nonspecific and age-indeterminate. Probable cyst in the right kidney upper pole. 4. Cholecystectomy. Mild intrahepatic biliary dilatation versus mild periportal edema. 5. Dependent changes in both lung bases, likely related atelectasis. 6. Indeterminate 4 mm nodule in the right middle lobe. No follow-up needed if patient is low-risk. Non-contrast chest CT can be considered in 12 months if patient is high-risk. This recommendation follows the consensus statement: Guidelines for Management of Incidental Pulmonary Nodules Detected on CT Images: From the Fleischner Society 2017; Radiology 2017; 284:228-243. Electronically Signed   By: Scherrie Gerlach.D.  On: 07/18/2021 08:23    Procedures Procedures    Medications Ordered in ED Medications  sodium chloride 0.9 % bolus 500 mL (0 mLs Intravenous Stopped 07/18/21 0807)  acetaminophen (TYLENOL) suppository 650 mg (650 mg Rectal Given 07/18/21 0514)  iohexol (OMNIPAQUE) 350 MG/ML injection 80 mL (80 mLs Intravenous Contrast Given 07/18/21 0659)  sodium chloride 0.9 % bolus 500 mL (0 mLs Intravenous Stopped 07/18/21 1217)    ED Course/ Medical Decision Making/ A&P                           Medical Decision Making Amount and/or Complexity of Data Reviewed External Data Reviewed: labs, radiology and notes.    Details: Gastroenterology notes Labs: ordered. Decision-making details documented in ED Course. Radiology: ordered.  Risk OTC drugs. Prescription drug management.   Patient presents to the emergency department for evaluation of abdominal pain with associated nausea, vomiting and diarrhea.  Patient reports recurrent similar symptoms with GI bleeding in the past.  Reviewing her outside records reveals a history of gastric antral vascular ectasia.  Patient is followed by GI at [facility].  Differential diagnosis upper GI bleed, cholecystitis, choledocholithiasis, pancreatitis,  diverticulitis, small bowel obstruction.  Patient was initially orthostatic, was given fluids prehospital and fluid resuscitation was continued.  Patient has a hemoglobin of 10.8, appears to be consistent with her baseline.  Patient will undergo CT angio abdomen to further evaluate for ongoing bleeding as well as other etiology of her symptoms.  She will be signed out to oncoming ER physician to follow-up on results of CT and disposition patient.        Final Clinical Impression(s) / ED Diagnoses Final diagnoses:  Diarrhea of infectious origin    Rx / DC Orders ED Discharge Orders     None         Benzion Mesta, Gwenyth Allegra, MD 07/20/21 [phone]

## 2021-07-18 NOTE — ED Notes (Signed)
Date and time results received: 07/18/21 0535 ?(use smartphrase ".now" to insert current time) ? ?Test: lactic ?Critical Value: 2.7 ? ?Name of Provider Notified: Dr. Betsey Holiday ? ?Orders Received? Or Actions Taken?: no/na ?

## 2021-07-18 NOTE — ED Provider Notes (Signed)
At discharge patient was feeling back to normal.  She has gotten a liter and a half of fluids and is no longer orthostatic.  She is not febrile and her lactic acid is down to 1.0.  She would like to be discharged home.  I suspect this is some type of viral infection.  She will follow-up with her primary care doctor in 1 to 2 days and not take her blood pressure medicine until she is rechecked ?  ?Milton Ferguson, MD ?07/18/21 1117 ? ?

## 2021-07-18 NOTE — ED Notes (Signed)
Pt gone to CT 

## 2021-07-23 ENCOUNTER — Telehealth: Payer: Self-pay

## 2021-07-23 NOTE — Telephone Encounter (Signed)
Pt went to ER last week and dx with a stomach virus. She said she is feeling weak and BP 140/90 this morning. Would like to see Dr Damita Dunnings to follow-up. Please advise if she can be worked in in the next few days.  ?

## 2021-07-23 NOTE — Telephone Encounter (Signed)
Can we add on at 9am tomorrow AM?  If so, let me know.  Thanks.  ?

## 2021-07-23 NOTE — Telephone Encounter (Signed)
Called and spoke with patient and is okay to come in tomorrow at 9am. I have sent to management to schedule.  ?

## 2021-07-24 ENCOUNTER — Other Ambulatory Visit: Payer: Self-pay

## 2021-07-24 ENCOUNTER — Encounter: Payer: Self-pay | Admitting: Family Medicine

## 2021-07-24 ENCOUNTER — Ambulatory Visit (INDEPENDENT_AMBULATORY_CARE_PROVIDER_SITE_OTHER): Payer: Medicare Other | Admitting: Family Medicine

## 2021-07-24 VITALS — BP 150/82 | HR 82 | Temp 97.9°F | Ht 64.0 in | Wt 113.0 lb

## 2021-07-24 DIAGNOSIS — R9389 Abnormal findings on diagnostic imaging of other specified body structures: Secondary | ICD-10-CM

## 2021-07-24 DIAGNOSIS — H6980 Other specified disorders of Eustachian tube, unspecified ear: Secondary | ICD-10-CM

## 2021-07-24 DIAGNOSIS — R197 Diarrhea, unspecified: Secondary | ICD-10-CM | POA: Diagnosis not present

## 2021-07-24 LAB — BASIC METABOLIC PANEL
BUN: 10 mg/dL (ref 6–23)
CO2: 32 mEq/L (ref 19–32)
Calcium: 9.4 mg/dL (ref 8.4–10.5)
Chloride: 102 mEq/L (ref 96–112)
Creatinine, Ser: 0.71 mg/dL (ref 0.40–1.20)
GFR: 83.52 mL/min (ref >60.00)
Glucose, Bld: 85 mg/dL (ref 70–99)
Potassium: 4.3 mEq/L (ref 3.5–5.1)
Sodium: 140 mEq/L (ref 135–145)

## 2021-07-24 LAB — CBC WITH DIFFERENTIAL/PLATELET
Basophils Absolute: 0 10*3/uL (ref 0.0–0.1)
Basophils Relative: 0.4 % (ref 0.0–3.0)
Eosinophils Absolute: 0.3 10*3/uL (ref 0.0–0.7)
Eosinophils Relative: 5 % (ref 0.0–5.0)
HCT: 33.1 % — ABNORMAL LOW (ref 36.0–46.0)
Hemoglobin: 11 g/dL — ABNORMAL LOW (ref 12.0–15.0)
Lymphocytes Relative: 16.8 % (ref 12.0–46.0)
Lymphs Abs: 0.9 10*3/uL (ref 0.7–4.0)
MCHC: 33.2 g/dL (ref 30.0–36.0)
MCV: 90 fl (ref 78.0–100.0)
Monocytes Absolute: 0.6 10*3/uL (ref 0.1–1.0)
Monocytes Relative: 10.4 % (ref 3.0–12.0)
Neutro Abs: 3.7 10*3/uL (ref 1.4–7.7)
Neutrophils Relative %: 67.4 % (ref 43.0–77.0)
Platelets: 277 10*3/uL (ref 150.0–400.0)
RBC: 3.67 Mil/uL — ABNORMAL LOW (ref 3.87–5.11)
RDW: 14.8 % (ref 11.5–15.5)
WBC: 5.5 10*3/uL (ref 4.0–10.5)

## 2021-07-24 NOTE — Patient Instructions (Signed)
I'll make plans about the follow up scans in 6 and 12 months.   ?Go to the lab on the way out.   If you have mychart we'll likely use that to update you.    ?Take care.  Glad to see you. ?

## 2021-07-24 NOTE — Progress Notes (Signed)
This visit occurred during the SARS-CoV-2 public health emergency.  Safety protocols were in place, including screening questions prior to the visit, additional usage of staff PPE, and extensive cleaning of exam room while observing appropriate contact time as indicated for disinfecting solutions. ? ?ER f/u.  She feels better in the meantime.  She is down a few pounds with the illness.  She is taking fluids well in the meantime.  She prev had fevers, chills, NAV, diarrhea.  She had IV fluids at ER.  Clearly better in the meantime.  Lower K prev noted, d/w pt about recheck labs.  See orders.   ? ?Her husband has progressive memory loss, d/w pt.  He didn't get recent illness thankfully.   ? ?Ear sx.  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  D/w pt.  D/w pt about using nasal saline and or plain claritin.  She can repeat valsalva at home.    ? ?Recent imaging d/w pt: ?Small saccular aneurysm coming off the right internal iliac artery measuring up to 7 mm. Recommend follow-up CTA in 6 months to ensure stability. ? ?Indeterminate 4 mm nodule in the right middle lobe. Non-contrast chest CT can be considered in 12 months if patient is high-risk.  ? ?Meds, vitals, and allergies reviewed.  ? ?ROS: Per HPI unless specifically indicated in ROS section  ? ?GEN: nad, alert and oriented ?HEENT: ncat, MMM ?NECK: supple w/o LA ?CV: rrr.  ?PULM: ctab, no inc wob ?ABD: soft, +bs ?EXT: no edema ?SKIN: well perfused.  ?TM wnl on exam- no erythema.   ? ?30 minutes were devoted to patient care in this encounter (this includes time spent reviewing the patient's file/history, interviewing and examining the patient, counseling/reviewing plan with patient).  ? ?

## 2021-07-25 DIAGNOSIS — R9389 Abnormal findings on diagnostic imaging of other specified body structures: Secondary | ICD-10-CM | POA: Insufficient documentation

## 2021-07-25 NOTE — Assessment & Plan Note (Signed)
Clearly feels better now.  Recheck labs pending.  Noted in community recently, likely benign viral illness that is improved.  Path/phys d/w pt.  Okay for outpatient f/u.  ?

## 2021-07-25 NOTE — Assessment & Plan Note (Signed)
Recent imaging d/w pt: ?Small saccular aneurysm coming off the right internal iliac artery measuring up to 7 mm. Recommend follow-up CTA in 6 months to ensure stability. ? ?Indeterminate 4 mm nodule in the right middle lobe. Non-contrast chest CT can be considered in 12 months if patient is high-risk.  ? ?Notes made in EMR about 6 and 12 month f/u respectively.   ?

## 2021-07-25 NOTE — Assessment & Plan Note (Signed)
D/w pt about using nasal saline and or plain claritin.  She can repeat valsalva at home.    ?

## 2021-07-30 ENCOUNTER — Encounter: Payer: Self-pay | Admitting: Family Medicine

## 2021-08-16 ENCOUNTER — Telehealth: Payer: Self-pay | Admitting: *Deleted

## 2021-08-16 ENCOUNTER — Other Ambulatory Visit: Payer: Self-pay | Admitting: *Deleted

## 2021-08-16 ENCOUNTER — Telehealth: Payer: Self-pay | Admitting: Family Medicine

## 2021-08-16 DIAGNOSIS — D508 Other iron deficiency anemias: Secondary | ICD-10-CM

## 2021-08-16 NOTE — Telephone Encounter (Signed)
Patient notified of lab results and verbalized understanding.  ° °

## 2021-08-16 NOTE — Telephone Encounter (Signed)
Pt called asking for a call back to discuss her lab results. Please advise. ?

## 2021-08-16 NOTE — Telephone Encounter (Signed)
This RN spoke with pt today - she states she is having symptoms of increased fatigue with "my stomach feels rumbley "  ?Pt has known GI bleeding and iron deficiency anemia with chronic need for iron or blood transfusions for support. ? ?Per discussion plan is to be seen as soon as possible to establish care with a provider here since Dr Jana Hakim has left. ? ?Appts made by this RN. ?

## 2021-08-20 ENCOUNTER — Inpatient Hospital Stay: Payer: Medicare Other | Attending: Adult Health | Admitting: Adult Health

## 2021-08-20 ENCOUNTER — Other Ambulatory Visit: Payer: Self-pay

## 2021-08-20 ENCOUNTER — Inpatient Hospital Stay: Payer: Medicare Other

## 2021-08-20 ENCOUNTER — Encounter: Payer: Self-pay | Admitting: Adult Health

## 2021-08-20 VITALS — BP 160/90 | HR 74 | Temp 97.9°F | Resp 16 | Ht 64.0 in | Wt 113.1 lb

## 2021-08-20 DIAGNOSIS — K31819 Angiodysplasia of stomach and duodenum without bleeding: Secondary | ICD-10-CM | POA: Diagnosis not present

## 2021-08-20 DIAGNOSIS — D509 Iron deficiency anemia, unspecified: Secondary | ICD-10-CM

## 2021-08-20 DIAGNOSIS — Z853 Personal history of malignant neoplasm of breast: Secondary | ICD-10-CM

## 2021-08-20 DIAGNOSIS — D508 Other iron deficiency anemias: Secondary | ICD-10-CM

## 2021-08-20 LAB — CBC WITH DIFFERENTIAL (CANCER CENTER ONLY)
Abs Immature Granulocytes: 0.01 10*3/uL (ref 0.00–0.07)
Basophils Absolute: 0 10*3/uL (ref 0.0–0.1)
Basophils Relative: 0 %
Eosinophils Absolute: 0.2 10*3/uL (ref 0.0–0.5)
Eosinophils Relative: 5 %
HCT: 33.1 % — ABNORMAL LOW (ref 36.0–46.0)
Hemoglobin: 10.4 g/dL — ABNORMAL LOW (ref 12.0–15.0)
Immature Granulocytes: 0 %
Lymphocytes Relative: 18 %
Lymphs Abs: 0.8 10*3/uL (ref 0.7–4.0)
MCH: 28.6 pg (ref 26.0–34.0)
MCHC: 31.4 g/dL (ref 30.0–36.0)
MCV: 90.9 fL (ref 80.0–100.0)
Monocytes Absolute: 0.4 10*3/uL (ref 0.1–1.0)
Monocytes Relative: 9 %
Neutro Abs: 2.9 10*3/uL (ref 1.7–7.7)
Neutrophils Relative %: 68 %
Platelet Count: 318 10*3/uL (ref 150–400)
RBC: 3.64 MIL/uL — ABNORMAL LOW (ref 3.87–5.11)
RDW: 13.9 % (ref 11.5–15.5)
WBC Count: 4.3 10*3/uL (ref 4.0–10.5)
nRBC: 0 % (ref 0.0–0.2)

## 2021-08-20 LAB — FERRITIN: Ferritin: 201 ng/mL (ref 11–307)

## 2021-08-20 LAB — SAMPLE TO BLOOD BANK

## 2021-08-20 NOTE — Assessment & Plan Note (Addendum)
Kristi Carr is a 76 year old with anemia indicating iron deficiency that is related to a history of diverticulitis and GAVE (followed at [facility], [facility] in [place]).   Her hemoglobin today is 10.4, her MCV is 90.  Her ferritin level is pending.  We discussed that we will call her with her ferritin results and if she does need IV iron we will give it to her.  We will see her back in 6 months for recheck or sooner if needed.  She does have a good idea of when her iron level is low and we can always check it if needed. ?

## 2021-08-20 NOTE — Progress Notes (Signed)
[facility] up: ?  ? ?[name], MD ?[place] ?[place] [address] ? ? ?DIAGNOSIS:  Cancer Staging  ?History of left breast cancer ?Staging form: Breast, AJCC 7th Edition ?- Clinical: Stage IIA (T2, N0, cM0) - Unsigned ?Specimen type: Core Needle Biopsy ?Histopathologic type: 9931 ?Laterality: Left ?Staging comments: Staged at breast conference 08/11/13. ? ?- Pathologic: No stage assigned - Unsigned ?Specimen type: Core Needle Biopsy ?Histopathologic type: 9931 ?Laterality: Left ? ? ?SUMMARY OF HEMATOLOGIC/ONCOLOGIC HISTORY: ?[place] woman status post left breast biopsy 08/02/2013 for a clinical T2 N0, stage IIA invasive papillary breast cancer, grade 1, estrogen receptor and progesterone receptor both 100% positive, with an MIB-1 of 9% and no HER-2 amplification ?  ?(1) Status post left lumpectomy and sentinel lymph node sampling 09/06/2013 for a pT1c pN0, stage IA invasive ductal carcinoma, grade 1, repeat HER-2 again negative ?  ?(2) Adjuvant radiation completed 11/30/2013 ?  ?(3) started tamoxifen 12/18/2013, completing 5 years April 2020 ?  ?(4) deer tick bite February 2017--no sequela ?  ?(5) osteoporosis: Bone density at [facility] 07/02/2017 showed a T score of -2.5 ?  ?(6) C. difficile colitis--completing second round of antibiotics March 2019 ?  ?(7)  anemia: as of 01/09/2018, ferritin was 16.2,, iron saturation, B12 and folate are normal as is the reticulocyte count and creatinine.  ?            (a) REVIEW OF BLOOD FILM 01/09/2018 shows mild rouleaux, but no other abnormalities: Specifically there are no tailed poikilocytes or significant anisocytosis, no left shift in the white cell series, and no artifactual platelet counts ?            (b) ferritin on 07/26/2019 was 3.5 (severe deficiency) ?            (c) iron sucrose started 08/18/2019, to be repeated x5, received 12/14/2019-01/11/2020, 06/14/2020-08/04/2020, 02/24/2021-03/12/2021 ?            (d) GI  workup: ?                        (I) colonoscopy 04/05/2019: diverticulosis ?  ? ?CURRENT THERAPY: observation; Venofer PRN ? ?INTERVAL HISTORY: ?Kristi Carr 75 y.o. female returns for follow-up of her history of iron deficiency she was seen in the ER on March 1 and was hypotensive.  She was given fluid resuscitation and responded well.  She was diagnosed with likely viral illness and discharged from the ER once she had clinically improved.  She was recommended to hold her blood pressure medicines until she followed up with her primary care provider.  She followed up with Dr. Damita Dunnings on March 7.  Her labs indicated that her hemoglobin was 11.  Last week she continued with persistent fatigue and was concerned her ferritin level had dropped.  She came in today for labs to see if she needed to receive any IV iron.  She has a history of bleeding secondary to gastric antral vascular ectasia.  She tells me however that since yesterday she has been feeling much better and feels like she will not need iron. ? ?Her most recent mammogram was completed on July 24, 2020 and showed no mammographic evidence of malignancy and breast density category C. ? ? ?Patient Active Problem List  ? Diagnosis Date Noted  ? Gastric antral vascular ectasia 08/20/2021  ? Abnormal finding on imaging 07/25/2021  ? Lichen sclerosus of female genitalia 12/03/2019  ?  Female bladder prolapse 12/03/2019  ? Osteopenia 12/03/2019  ? Diverticulosis 04/04/2019  ? Internal hemorrhoids 04/04/2019  ? Status post cholecystectomy 12/22/2018  ? History of adenomatous polyp of colon 09/16/2017  ? Sensorineural hearing loss (SNHL), bilateral 09/10/2017  ? IDA (iron deficiency anemia) 07/31/2017  ? Murmur 07/06/2017  ? Recurrent Clostridium difficile diarrhea 03/11/2017  ? Healthcare maintenance 12/15/2016  ? ETD (eustachian tube dysfunction) 05/22/2016  ? Vaginal pessary in situ 02/14/2016  ? Advance care planning 05/23/2015  ? History of left breast cancer  08/11/2013  ? Diarrhea 11/04/2012  ? Medicare annual wellness visit, subsequent 11/04/2012  ? Hypercholesteremia 02/20/2011  ? HTN (hypertension) 12/19/2010  ? Vitamin D deficiency 06/22/2008  ? RENAL CALCULUS 08/19/2003  ? ? ?is allergic to clindamycin/lincomycin. ? ?MEDICAL HISTORY: ?Past Medical History:  ?Diagnosis Date  ? Anemia   ? Breast cancer of upper-outer quadrant of left female breast ([place])   ? Gallstones 08/23/03  ? x 2  ? Gastric hemorrhage due to gastric antral vascular ectasia (GAVE)   ? s/p radiofrequency ablation   ? Hot flashes   ? Hypertension   ? Kidney stones   ? Radiation 10/14/13-12/02/13  ? left breast 50.4 gray, lumpectomy cavity boosted to 63 gray  ? Wears glasses   ? ? ?SURGICAL HISTORY: ?Past Surgical History:  ?Procedure Laterality Date  ? BREAST LUMPECTOMY WITH SENTINEL LYMPH NODE BIOPSY  09/06/13  ? LEFT BREAST SEED GUIDED LUMPECTOMY WITH LEFT AXILLARY SENTINEL NODE BIOPSY (Left)  ? CHOLECYSTECTOMY    ? COLONOSCOPY    ? PARTIAL HYSTERECTOMY  1980  ? ovaries left in  ? TONSILLECTOMY    ? TUBAL LIGATION    ? WISDOM TOOTH EXTRACTION    ? ? ?SOCIAL HISTORY: ?Social History  ? ?Socioeconomic History  ? Marital status: Married  ?  Spouse name: Not on file  ? Number of children: 2  ? Years of education: Not on file  ? Highest education level: Not on file  ?Occupational History  ? Occupation: Retired from [employer] in 2008  ?Tobacco Use  ? Smoking status: Never  ? Smokeless tobacco: Never  ?Vaping Use  ? Vaping Use: Never used  ?Substance and Sexual Activity  ? Alcohol use: No  ? Drug use: No  ? Sexual activity: Yes  ?Other Topics Concern  ? Not on file  ?Social History Narrative  ? Married 1966  ? 2 kids  ? 4 grand children  ? Retired from [employer]  ? ?Social Determinants of Health  ? ?Financial Resource Strain: Low Risk   ? Difficulty of Paying Living Expenses: Not hard at all  ?Food Insecurity: No Food Insecurity  ? Worried About Charity fundraiser in the Last Year: Never true  ? Ran Out  of Food in the Last Year: Never true  ?Transportation Needs: No Transportation Needs  ? Lack of Transportation (Medical): No  ? Lack of Transportation (Non-Medical): No  ?Physical Activity: Sufficiently Active  ? Days of Exercise per Week: 7 days  ? Minutes of Exercise per Session: 30 min  ?Stress: No Stress Concern Present  ? Feeling of Stress : Not at all  ?Social Connections: Not on file  ?Intimate Partner Violence: Not At Risk  ? Fear of Current or Ex-Partner: No  ? Emotionally Abused: No  ? Physically Abused: No  ? Sexually Abused: No  ? ? ?FAMILY HISTORY: ?Family History  ?Problem Relation Age of Onset  ? Diabetes Mother   ? Hyperlipidemia Mother   ?  Hypertension Mother   ? Cancer Mother   ?     Breast   ? Breast cancer Mother   ? Heart attack Father   ? Heart disease Father   ? Hypertension Sister   ? Heart disease Brother   ?     CHF  EF 10%  ? Hypertension Brother   ? Gout Brother   ? Depression Neg Hx   ? Alcohol abuse Neg Hx   ? Drug abuse Neg Hx   ? Colon cancer Neg Hx   ? Uterine cancer Neg Hx   ? Ovarian cancer Neg Hx   ? Prostate cancer Neg Hx   ? ? ?Review of Systems  ?Constitutional:  Negative for appetite change, chills, fatigue, fever and unexpected weight change.  ?HENT:   Negative for hearing loss, lump/mass and trouble swallowing.   ?Eyes:  Negative for eye problems and icterus.  ?Respiratory:  Negative for chest tightness, cough and shortness of breath.   ?Cardiovascular:  Negative for chest pain, leg swelling and palpitations.  ?Gastrointestinal:  Negative for abdominal distention, abdominal pain, constipation, diarrhea, nausea and vomiting.  ?Endocrine: Negative for hot flashes.  ?Genitourinary:  Negative for difficulty urinating.   ?Musculoskeletal:  Negative for arthralgias.  ?Skin:  Negative for itching and rash.  ?Neurological:  Negative for dizziness, extremity weakness, headaches and numbness.  ?Hematological:  Negative for adenopathy. Does not bruise/bleed easily.   ?Psychiatric/Behavioral:  Negative for depression. The patient is not nervous/anxious.    ? ? ?PHYSICAL EXAMINATION ? ?ECOG PERFORMANCE STATUS: 1 - Symptomatic but completely ambulatory ? ?Vitals:  ? 08/20/21 1029 08/20/21 1057  ?BP

## 2021-08-20 NOTE — Assessment & Plan Note (Signed)
She has a history of left breast cancer status postlumpectomy, radiation, 5 years of adjuvant tamoxifen.  Her most recent mammogram was negative for malignancy.  She continues with annual mammograms and has no clinical or radiographic sign of breast cancer recurrence. ?

## 2021-09-17 DEATH — deceased

## 2021-10-19 ENCOUNTER — Telehealth: Payer: Self-pay | Admitting: Internal Medicine

## 2021-10-19 NOTE — Telephone Encounter (Signed)
Good Morning Dr. Lorenso Courier,   Patient called stating she wanted to establish GI care with you due to her GI provider Dr. Earlean Shawl retiring. Patient was last seen at [facility] in November of 2022. Patients records are in [place]. Will you please review and advise on taking patient under your care?  Please advise.

## 2021-10-19 NOTE — Telephone Encounter (Signed)
Called patient to advise and had to leave voicemail.

## 2021-10-30 ENCOUNTER — Encounter: Payer: Self-pay | Admitting: Internal Medicine

## 2021-10-30 NOTE — Telephone Encounter (Signed)
Patient has been scheduled for 12/03/21.

## 2021-11-14 ENCOUNTER — Other Ambulatory Visit: Payer: Self-pay | Admitting: *Deleted

## 2021-11-14 ENCOUNTER — Telehealth: Payer: Self-pay | Admitting: *Deleted

## 2021-11-14 DIAGNOSIS — D508 Other iron deficiency anemias: Secondary | ICD-10-CM

## 2021-11-14 NOTE — Telephone Encounter (Signed)
This RN spoke with pt per her call stating she will be having another ablation for varices in later July - is having some symptoms of low heme and wants to be preventative.  Per above- will  have pt come in for lab on 11/16/2021. Appt and orders entered by this RN.

## 2021-11-16 ENCOUNTER — Other Ambulatory Visit: Payer: Self-pay

## 2021-11-16 ENCOUNTER — Inpatient Hospital Stay: Payer: Medicare Other | Attending: Adult Health

## 2021-11-16 DIAGNOSIS — D509 Iron deficiency anemia, unspecified: Secondary | ICD-10-CM | POA: Insufficient documentation

## 2021-11-16 DIAGNOSIS — Z853 Personal history of malignant neoplasm of breast: Secondary | ICD-10-CM | POA: Insufficient documentation

## 2021-11-16 DIAGNOSIS — D508 Other iron deficiency anemias: Secondary | ICD-10-CM

## 2021-11-16 LAB — CBC WITH DIFFERENTIAL (CANCER CENTER ONLY)
Abs Immature Granulocytes: 0.01 10*3/uL (ref 0.00–0.07)
Basophils Absolute: 0 10*3/uL (ref 0.0–0.1)
Basophils Relative: 0 %
Eosinophils Absolute: 0.2 10*3/uL (ref 0.0–0.5)
Eosinophils Relative: 4 %
HCT: 32.9 % — ABNORMAL LOW (ref 36.0–46.0)
Hemoglobin: 10.4 g/dL — ABNORMAL LOW (ref 12.0–15.0)
Immature Granulocytes: 0 %
Lymphocytes Relative: 15 %
Lymphs Abs: 0.9 10*3/uL (ref 0.7–4.0)
MCH: 28 pg (ref 26.0–34.0)
MCHC: 31.6 g/dL (ref 30.0–36.0)
MCV: 88.4 fL (ref 80.0–100.0)
Monocytes Absolute: 0.6 10*3/uL (ref 0.1–1.0)
Monocytes Relative: 10 %
Neutro Abs: 4.3 10*3/uL (ref 1.7–7.7)
Neutrophils Relative %: 71 %
Platelet Count: 279 10*3/uL (ref 150–400)
RBC: 3.72 MIL/uL — ABNORMAL LOW (ref 3.87–5.11)
RDW: 14.1 % (ref 11.5–15.5)
WBC Count: 6 10*3/uL (ref 4.0–10.5)
nRBC: 0 % (ref 0.0–0.2)

## 2021-11-16 LAB — SAMPLE TO BLOOD BANK

## 2021-12-03 ENCOUNTER — Encounter: Payer: Self-pay | Admitting: Internal Medicine

## 2021-12-03 ENCOUNTER — Ambulatory Visit: Payer: Medicare Other | Admitting: Internal Medicine

## 2021-12-03 VITALS — BP 160/90 | HR 60 | Ht 63.0 in | Wt 110.5 lb

## 2021-12-03 DIAGNOSIS — R197 Diarrhea, unspecified: Secondary | ICD-10-CM

## 2021-12-03 DIAGNOSIS — Z9049 Acquired absence of other specified parts of digestive tract: Secondary | ICD-10-CM | POA: Diagnosis not present

## 2021-12-03 DIAGNOSIS — K589 Irritable bowel syndrome without diarrhea: Secondary | ICD-10-CM | POA: Diagnosis not present

## 2021-12-03 DIAGNOSIS — K31819 Angiodysplasia of stomach and duodenum without bleeding: Secondary | ICD-10-CM

## 2021-12-03 DIAGNOSIS — D649 Anemia, unspecified: Secondary | ICD-10-CM | POA: Diagnosis not present

## 2021-12-03 DIAGNOSIS — R1030 Lower abdominal pain, unspecified: Secondary | ICD-10-CM

## 2021-12-03 MED ORDER — DICYCLOMINE HCL 10 MG PO CAPS: 10.0000 mg | ORAL_CAPSULE | ORAL | 1 refills | Status: DC | PRN

## 2021-12-03 MED ORDER — DIPHENOXYLATE-ATROPINE 2.5-0.025 MG PO TABS: 1.0000 | ORAL_TABLET | Freq: Four times a day (QID) | ORAL | 1 refills | Status: DC | PRN

## 2021-12-03 MED ORDER — COLESTIPOL HCL 1 G PO TABS: 2.0000 g | ORAL_TABLET | Freq: Two times a day (BID) | ORAL | 1 refills | Status: DC

## 2021-12-03 NOTE — Progress Notes (Signed)
Chief Complaint: Anemia  HPI : 76 year old female with history of GAVE, breast cancer s/p lumpectomy and radiation, osteoporosis, prior C dif infection presents with anemia  She has been losing weight, about 3 lbs over the last 3 months. She can eat something today, and then she eats the same thing and has to go to the bathroom. She sometimes has abdominal pain. The pain is located in the lower abdomen in the middle. She will use a heating pad and Tylenol, which helps resolve the pain. Having a BM seems to help with the pain as well. She has gas. Sometimes she will have 3-5 BMs per day when she has more profuse diarrhea. She would be comfortable with having 2 BMs per day. Has occasional nocturnal stools. She had her gallbladder removed in 2015. After she was diagnosed with C dif, she did think that her pain has been worse. She is still seeing black stools on occasion, which has been attributed to GAVE. She follows with [facility] for routine EGDs to get her GAVE treated. She was given Lomotil, which she takes before going out of the house to prevent diarrhea from occurring. The Lomotil does appear to be effective.  Wt Readings from Last 3 Encounters:  12/03/21 110 lb 8 oz (50.1 kg)  08/20/21 113 lb 1.6 oz (51.3 kg)  07/24/21 113 lb (51.3 kg)   Past Medical History:  Diagnosis Date   Anemia    Breast cancer of upper-outer quadrant of left female breast ([facility])    C. difficile diarrhea    Colon polyps    Gallstones 08/23/2003   x 2   Gastric hemorrhage due to gastric antral vascular ectasia (GAVE)    s/p radiofrequency ablation    GAVE (gastric antral vascular ectasia)    Hot flashes    Hypertension    Kidney stones    Radiation 10/14/13-12/02/13   left breast 50.4 gray, lumpectomy cavity boosted to 63 gray   Wears glasses    Past Surgical History:  Procedure Laterality Date   BREAST LUMPECTOMY WITH SENTINEL LYMPH NODE BIOPSY Left 09/06/2013   LEFT BREAST SEED GUIDED LUMPECTOMY WITH  LEFT AXILLARY SENTINEL NODE BIOPSY (Left)   CHOLECYSTECTOMY     COLONOSCOPY     PARTIAL HYSTERECTOMY  05/20/1978   ovaries left in   TONSILLECTOMY     TUBAL LIGATION     WISDOM TOOTH EXTRACTION     Family History  Problem Relation Age of Onset   Diabetes Mother    Hyperlipidemia Mother    Hypertension Mother    Breast cancer Mother    Heart attack Father    Heart disease Father    Hypertension Sister    Diabetes Sister    Heart disease Brother        CHF  EF 10%   Hypertension Brother    Gout Brother    Depression Neg Hx    Alcohol abuse Neg Hx    Drug abuse Neg Hx    Colon cancer Neg Hx    Uterine cancer Neg Hx    Ovarian cancer Neg Hx    Prostate cancer Neg Hx    Social History   Tobacco Use   Smoking status: Never   Smokeless tobacco: Never  Vaping Use   Vaping Use: Never used  Substance Use Topics   Alcohol use: No   Drug use: No   Current Outpatient Medications  Medication Sig Dispense Refill   clobetasol cream (TEMOVATE) 0.05 %  Use daily as needed.     colestipol (COLESTID) 1 g tablet Take 2 tablets (2 g total) by mouth 2 (two) times daily. 30 tablet 1   dicyclomine (BENTYL) 10 MG capsule Take 1 capsule (10 mg total) by mouth as needed for spasms. 90 capsule 1   ketoconazole (NIZORAL) 2 % cream Apply 1 Application topically as needed.     metoprolol succinate (TOPROL-XL) 50 MG 24 hr tablet Take 1 tablet (50 mg total) by mouth daily. Take with or immediately following a meal.  Take an extra 1/2 tab daily if needed if BP is >150/>90     omeprazole (PRILOSEC) 40 MG capsule Take 1 capsule (40 mg total) by mouth 2 (two) times daily.     Vitamin D, Ergocalciferol, (DRISDOL) 1.25 MG (50000 UNIT) CAPS capsule Take 50,000 Units by mouth every 30 (thirty) days.     diphenoxylate-atropine (LOMOTIL) 2.5-0.025 MG tablet Take 1 tablet by mouth 4 (four) times daily as needed for diarrhea or loose stools. 30 tablet 1   No current facility-administered medications for this  visit.   Allergies  Allergen Reactions   Clindamycin/Lincomycin Other (See Comments)    GI upset attributed to med.  Not an allergy.    Review of Systems: All systems reviewed and negative except where noted in HPI.   Physical Exam: BP (!) 160/90 (BP Location: Right Arm, Patient Position: Sitting, Cuff Size: Normal)   Pulse 60   Ht '5\' 3"'$  (1.6 m) Comment: height measured without shoes  Wt 110 lb 8 oz (50.1 kg)   BMI 19.57 kg/m  Constitutional: Pleasant,well-developed, female in no acute distress. HEENT: Normocephalic and atraumatic. Conjunctivae are normal. No scleral icterus. Cardiovascular: Normal rate, regular rhythm.  Pulmonary/chest: Effort normal and breath sounds normal. No wheezing, rales or rhonchi. Abdominal: Soft, nondistended, nontender. Bowel sounds active throughout. There are no masses palpable. No hepatomegaly. Extremities: No edema Neurological: Alert and oriented to person place and time. Skin: Skin is warm and dry. No rashes noted. Psychiatric: Normal mood and affect. Behavior is normal.  Labs 07/2021: CBC with low Hb of 11. BMP nml. FOBT positive.  Labs 08/2021: CBC with low Hb of 10.4. Ferritin 201.  Labs 10/2021: CBC with low Hb of 10.4.  TTE 07/23/17: Study Conclusions  - Left ventricle: The cavity size was normal. There was mild focal    basal hypertrophy of the septum. Systolic function was normal.    The estimated ejection fraction was in the range of 60% to 65%.    Wall motion was normal; there were no regional wall motion    abnormalities. Doppler parameters are consistent with abnormal    left ventricular relaxation (grade 1 diastolic dysfunction).  - Mitral valve: There was mild regurgitation.  - Left atrium: The atrium was normal in size.  - Right ventricle: Systolic function was normal.  - Pulmonary arteries: Systolic pressure was within the normal    range.   CTA GI Bleed 07/18/21: IMPRESSION: VASCULAR 1. Visceral arteries in the abdomen and  pelvis are widely patent. No evidence for acute or chronic mesenteric ischemia. 2. 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 Recommend follow-up CTA in 6-12 months to ensure stability. 3.  Aortic Atherosclerosis (ICD10-I70.0). NON-VASCULAR 1. No evidence for active GI bleeding. 2. Extensive colonic diverticulosis without acute inflammation. 3. Perinephric edema or stranding along the posteroinferior aspect of both kidneys. Findings are nonspecific and age-indeterminate. Probable cyst in the right kidney upper  pole. 4. Cholecystectomy. Mild intrahepatic biliary dilatation versus mild periportal edema. 5. Dependent changes in both lung bases, likely related atelectasis. 6. Indeterminate 4 mm nodule in the right middle lobe. No follow-up needed if patient is low-risk. Non-contrast chest CT can be considered in 12 months if patient is high-risk. This recommendation follows the consensus statement: Guidelines for Management of Incidental Pulmonary Nodules Detected on CT Images: From the Fleischner Society 2017; Radiology 2017; 284:228-243.  Colonoscopy 01/27/14: Left sided diverticulosis. Internal hemorrhoids. Recommend repeat in 5 years.  Colonoscopy 04/05/19: Diverticulosis of the sigmoid colon. Internal hemorrhoids. Recommended repeat colonoscopy in 5 years due to personal history of adenoma  EGD 08/08/20: Esophagus and duodenum were normal. 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   ASSESSMENT AND PLAN: Diarrhea Lower abdominal pain IBS History of cholecystectomy Anemia History of GAVE Patient presents with diarrhea and lower abdominal pain. At this time I do suspect that her symptoms may be due to bile acid diarrhea or IBS. Alternatively microscopic colitis is another possibility. Will start off by starting her on a bile acid binder and giving her Bentyl PRN to help with ab pain. She has responded  well to Lomotil PRN for diarrhea. Patient declined a repeat colonoscopy for now to obtain biopsies to rule out microscopic colitis. For her weight loss, I have asked the patient to try to start a daily nutritional supplement.  - Start colestipol 2 mg BID - Start Bentyl PRN for ab pain - Cont Lomotil PRN for diarrhea - Will give prescription for VSL probiotic, which she thinks has been beneficial - Start once daily nutritional supplement such as Ensure - Patient is scheduled to get an EGD for treatment of GAVE later this month - RTC 2 months. If patient is not responsive to colestipol, can consider colonoscopy in the future to rule out microscopic colitis  Christia Reading, MD  I spent 60 minutes of time, including in depth chart review, independent review of results as outlined above, communicating results with the patient directly, face-to-face time with the patient, coordinating care, ordering studies and medications as appropriate, and documentation.

## 2021-12-03 NOTE — Patient Instructions (Signed)
If you are age 75 or older, your body mass index should be between 23-30. Your Body mass index is 19.57 kg/m. If this is out of the aforementioned range listed, please consider follow up with your Primary Care Provider.  If you are age 15 or younger, your body mass index should be between 19-25. Your Body mass index is 19.57 kg/m. If this is out of the aformentioned range listed, please consider follow up with your Primary Care Provider.   We have sent the following medications to your pharmacy for you to pick up at your convenience: Colestipol 2 mg twice daily, Bentyl 10 mg as needed for abdominal pain.   Start a daily nutritional supplement like ensure.   The [place] [facility] providers would like to encourage you to use [facility] to communicate with providers for non-urgent requests or questions.  Due to long hold times on the telephone, sending your provider a message by [facility] may be a faster and more efficient way to get a response.  Please allow 48 business hours for a response.  Please remember that this is for non-urgent requests.   Thank you for entrusting me with your care and for choosing [facility], Dr. Christia Reading

## 2021-12-21 ENCOUNTER — Other Ambulatory Visit: Payer: Self-pay | Admitting: Family Medicine

## 2021-12-21 DIAGNOSIS — I10 Essential (primary) hypertension: Secondary | ICD-10-CM

## 2021-12-21 DIAGNOSIS — D508 Other iron deficiency anemias: Secondary | ICD-10-CM

## 2021-12-21 DIAGNOSIS — E559 Vitamin D deficiency, unspecified: Secondary | ICD-10-CM

## 2021-12-24 ENCOUNTER — Telehealth: Payer: Self-pay | Admitting: Internal Medicine

## 2021-12-24 MED ORDER — COLESTIPOL HCL 1 G PO TABS: 2.0000 g | ORAL_TABLET | Freq: Two times a day (BID) | ORAL | 1 refills | Status: DC

## 2021-12-24 NOTE — Telephone Encounter (Signed)
Rx for Colestid 1g 2 tablets twice daily sent to the pharmacy as requested. Patient aware and verbalized understanding.  No further questions.

## 2021-12-24 NOTE — Telephone Encounter (Signed)
PT is calling about how to take the medication Colestipol. Says she doesn't have enough pills to take it the way its ordered. Please reach out to advise.Thank you

## 2021-12-27 ENCOUNTER — Ambulatory Visit (INDEPENDENT_AMBULATORY_CARE_PROVIDER_SITE_OTHER): Payer: Medicare Other

## 2021-12-27 VITALS — Wt 110.0 lb

## 2021-12-27 DIAGNOSIS — Z0000 Encounter for general adult medical examination without abnormal findings: Secondary | ICD-10-CM

## 2021-12-27 NOTE — Patient Instructions (Signed)
Ms. Lezotte , Thank you for taking time to come for your Medicare Wellness Visit. I appreciate your ongoing commitment to your health goals. Please review the following plan we discussed and let me know if I can assist you in the future.   Screening recommendations/referrals: Colonoscopy: FOBT 07/18/21 Mammogram: 07/30/21 Bone Density: declined referral Recommended yearly ophthalmology/optometry visit for glaucoma screening and checkup Recommended yearly dental visit for hygiene and checkup  Vaccinations: Influenza vaccine: 02/27/21 Pneumococcal vaccine: 05/23/15 Tdap vaccine: 12/05/10, due if have injury Shingles vaccine: Shingrix 07/09/21, 10/01/21   Zostavax 05/17/19   Covid-19:06/09/19, 06/28/19, 04/26/20  Advanced directives: yes  Conditions/risks identified: no  Next appointment: Follow up in one year for your annual wellness visit 12/30/22 @ 12 noon by phone   Preventive Care 65 Years and Older, Female Preventive care refers to lifestyle choices and visits with your health care provider that can promote health and wellness. What does preventive care include? A yearly physical exam. This is also called an annual well check. Dental exams once or twice a year. Routine eye exams. Ask your health care provider how often you should have your eyes checked. Personal lifestyle choices, including: Daily care of your teeth and gums. Regular physical activity. Eating a healthy diet. Avoiding tobacco and drug use. Limiting alcohol use. Practicing safe sex. Taking low-dose aspirin every day. Taking vitamin and mineral supplements as recommended by your health care provider. What happens during an annual well check? The services and screenings done by your health care provider during your annual well check will depend on your age, overall health, lifestyle risk factors, and family history of disease. Counseling  Your health care provider may ask you questions about your: Alcohol use. Tobacco  use. Drug use. Emotional well-being. Home and relationship well-being. Sexual activity. Eating habits. History of falls. Memory and ability to understand (cognition). Work and work Statistician. Reproductive health. Screening  You may have the following tests or measurements: Height, weight, and BMI. Blood pressure. Lipid and cholesterol levels. These may be checked every 5 years, or more frequently if you are over 22 years old. Skin check. Lung cancer screening. You may have this screening every year starting at age 44 if you have a 30-pack-year history of smoking and currently smoke or have quit within the past 15 years. Fecal occult blood test (FOBT) of the stool. You may have this test every year starting at age 59. Flexible sigmoidoscopy or colonoscopy. You may have a sigmoidoscopy every 5 years or a colonoscopy every 10 years starting at age 42. Hepatitis C blood test. Hepatitis B blood test. Sexually transmitted disease (STD) testing. Diabetes screening. This is done by checking your blood sugar (glucose) after you have not eaten for a while (fasting). You may have this done every 1-3 years. Bone density scan. This is done to screen for osteoporosis. You may have this done starting at age 12. Mammogram. This may be done every 1-2 years. Talk to your health care provider about how often you should have regular mammograms. Talk with your health care provider about your test results, treatment options, and if necessary, the need for more tests. Vaccines  Your health care provider may recommend certain vaccines, such as: Influenza vaccine. This is recommended every year. Tetanus, diphtheria, and acellular pertussis (Tdap, Td) vaccine. You may need a Td booster every 10 years. Zoster vaccine. You may need this after age 46. Pneumococcal 13-valent conjugate (PCV13) vaccine. One dose is recommended after age 85. Pneumococcal polysaccharide (PPSV23) vaccine.  One dose is recommended  after age 62. Talk to your health care provider about which screenings and vaccines you need and how often you need them. This information is not intended to replace advice given to you by your health care provider. Make sure you discuss any questions you have with your health care provider. Document Released: 06/02/2015 Document Revised: 01/24/2016 Document Reviewed: 03/07/2015 Elsevier Interactive Patient Education  2017 [place] Prevention in the Home Falls can cause injuries. They can happen to people of all ages. There are many things you can do to make your home safe and to help prevent falls. What can I do on the outside of my home? Regularly fix the edges of walkways and driveways and fix any cracks. Remove anything that might make you trip as you walk through a door, such as a raised step or threshold. Trim any bushes or trees on the path to your home. Use bright outdoor lighting. Clear any walking paths of anything that might make someone trip, such as rocks or tools. Regularly check to see if handrails are loose or broken. Make sure that both sides of any steps have handrails. Any raised decks and porches should have guardrails on the edges. Have any leaves, snow, or ice cleared regularly. Use sand or salt on walking paths during winter. Clean up any spills in your garage right away. This includes oil or grease spills. What can I do in the bathroom? Use night lights. Install grab bars by the toilet and in the tub and shower. Do not use towel bars as grab bars. Use non-skid mats or decals in the tub or shower. If you need to sit down in the shower, use a plastic, non-slip stool. Keep the floor dry. Clean up any water that spills on the floor as soon as it happens. Remove soap buildup in the tub or shower regularly. Attach bath mats securely with double-sided non-slip rug tape. Do not have throw rugs and other things on the floor that can make you trip. What can I do  in the bedroom? Use night lights. Make sure that you have a light by your bed that is easy to reach. Do not use any sheets or blankets that are too big for your bed. They should not hang down onto the floor. Have a firm chair that has side arms. You can use this for support while you get dressed. Do not have throw rugs and other things on the floor that can make you trip. What can I do in the kitchen? Clean up any spills right away. Avoid walking on wet floors. Keep items that you use a lot in easy-to-reach places. If you need to reach something above you, use a strong step stool that has a grab bar. Keep electrical cords out of the way. Do not use floor polish or wax that makes floors slippery. If you must use wax, use non-skid floor wax. Do not have throw rugs and other things on the floor that can make you trip. What can I do with my stairs? Do not leave any items on the stairs. Make sure that there are handrails on both sides of the stairs and use them. Fix handrails that are broken or loose. Make sure that handrails are as long as the stairways. Check any carpeting to make sure that it is firmly attached to the stairs. Fix any carpet that is loose or worn. Avoid having throw rugs at the top or bottom of  the stairs. If you do have throw rugs, attach them to the floor with carpet tape. Make sure that you have a light switch at the top of the stairs and the bottom of the stairs. If you do not have them, ask someone to add them for you. What else can I do to help prevent falls? Wear shoes that: Do not have high heels. Have rubber bottoms. Are comfortable and fit you well. Are closed at the toe. Do not wear sandals. If you use a stepladder: Make sure that it is fully opened. Do not climb a closed stepladder. Make sure that both sides of the stepladder are locked into place. Ask someone to hold it for you, if possible. Clearly mark and make sure that you can see: Any grab bars or  handrails. First and last steps. Where the edge of each step is. Use tools that help you move around (mobility aids) if they are needed. These include: Canes. Walkers. Scooters. Crutches. Turn on the lights when you go into a dark area. Replace any light bulbs as soon as they burn out. Set up your furniture so you have a clear path. Avoid moving your furniture around. If any of your floors are uneven, fix them. If there are any pets around you, be aware of where they are. Review your medicines with your doctor. Some medicines can make you feel dizzy. This can increase your chance of falling. Ask your doctor what other things that you can do to help prevent falls. This information is not intended to replace advice given to you by your health care provider. Make sure you discuss any questions you have with your health care provider. Document Released: 03/02/2009 Document Revised: 10/12/2015 Document Reviewed: 06/10/2014 Elsevier Interactive Patient Education  2017 [employer].

## 2021-12-27 NOTE — Progress Notes (Signed)
Virtual Visit via Telephone Note  I connected with  Kristi Carr on 12/27/21 at 11:30 AM EDT by telephone and verified that I am speaking with the correct person using two identifiers.  Location: Patient: home Provider: [place] Persons participating in the virtual visit: [name]   I discussed the limitations, risks, security and privacy concerns of performing an evaluation and management service by telephone and the availability of in person appointments. The patient expressed understanding and agreed to proceed.  Interactive audio and video telecommunications were attempted between this nurse and patient, however failed, due to patient having technical difficulties OR patient did not have access to video capability.  We continued and completed visit with audio only.  Some vital signs may be absent or patient reported.   Dionisio David, LPN  Subjective:   Kristi Carr is a 76 y.o. female who presents for Medicare Annual (Subsequent) preventive examination.  Review of Systems     Cardiac Risk Factors include: advanced age (>54mn, >>65women);hypertension     Objective:    There were no vitals filed for this visit. There is no height or weight on file to calculate BMI.     12/27/2021   11:32 AM 07/18/2021    5:03 AM 03/05/2021   12:27 PM 12/20/2020    1:14 PM 08/04/2020    1:40 PM 05/12/2020    8:05 AM 01/11/2020    2:04 PM  Advanced Directives  Does Patient Have a Medical Advance Directive? Yes No Yes Yes No No Yes  Type of AParamedicof [place] will  [place] will [place]ot[place] MeadowsLiving will   [place] will  Does patient want to make changes to medical advance directive? Yes (Inpatient - patient defers changing a medical advance directive and declines information at this time)  No - Patient declined  No - Patient declined  No - Patient declined  Copy  of [place] Chart? Yes - validated most recent copy scanned in chart (See row information)  Yes - validated most recent copy scanned in chart (See row information) Yes - validated most recent copy scanned in chart (See row information)     Would patient like information on creating a medical advance directive?  No - Patient declined No - Patient declined  No - Patient declined      Current Medications (verified) Outpatient Encounter Medications as of 12/27/2021  Medication Sig   clobetasol cream (TEMOVATE) 0.05 % Use daily as needed.   diphenoxylate-atropine (LOMOTIL) 2.5-0.025 MG tablet Take 1 tablet by mouth 4 (four) times daily as needed for diarrhea or loose stools.   ketoconazole (NIZORAL) 2 % cream Apply 1 Application topically as needed.   metoprolol succinate (TOPROL-XL) 50 MG 24 hr tablet Take 1 tablet (50 mg total) by mouth daily. Take with or immediately following a meal.  Take an extra 1/2 tab daily if needed if BP is >150/>90   omeprazole (PRILOSEC) 40 MG capsule Take 1 capsule (40 mg total) by mouth 2 (two) times daily.   Vitamin D, Ergocalciferol, (DRISDOL) 1.25 MG (50000 UNIT) CAPS capsule Take 50,000 Units by mouth every 30 (thirty) days.   colestipol (COLESTID) 1 g tablet Take 2 tablets (2 g total) by mouth 2 (two) times daily. (Patient not taking: Reported on 12/27/2021)   dicyclomine (BENTYL) 10 MG capsule Take 1 capsule (10 mg total) by mouth as needed for spasms. (Patient  not taking: Reported on 12/27/2021)   No facility-administered encounter medications on file as of 12/27/2021.    Allergies (verified) Clindamycin/lincomycin   History: Past Medical History:  Diagnosis Date   Anemia    Breast cancer of upper-outer quadrant of left female breast ([place])    C. difficile diarrhea    Colon polyps    Gallstones 08/23/2003   x 2   Gastric hemorrhage due to gastric antral vascular ectasia (GAVE)    s/p radiofrequency ablation    GAVE (gastric antral  vascular ectasia)    Hot flashes    Hypertension    Kidney stones    Radiation 10/14/13-12/02/13   left breast 50.4 gray, lumpectomy cavity boosted to 63 gray   Wears glasses    Past Surgical History:  Procedure Laterality Date   BREAST LUMPECTOMY WITH SENTINEL LYMPH NODE BIOPSY Left 09/06/2013   LEFT BREAST SEED GUIDED LUMPECTOMY WITH LEFT AXILLARY SENTINEL NODE BIOPSY (Left)   CHOLECYSTECTOMY     COLONOSCOPY     PARTIAL HYSTERECTOMY  05/20/1978   ovaries left in   TONSILLECTOMY     TUBAL LIGATION     WISDOM TOOTH EXTRACTION     Family History  Problem Relation Age of Onset   Diabetes Mother    Hyperlipidemia Mother    Hypertension Mother    Breast cancer Mother    Heart attack Father    Heart disease Father    Hypertension Sister    Diabetes Sister    Heart disease Brother        CHF  EF 10%   Hypertension Brother    Gout Brother    Depression Neg Hx    Alcohol abuse Neg Hx    Drug abuse Neg Hx    Colon cancer Neg Hx    Uterine cancer Neg Hx    Ovarian cancer Neg Hx    Prostate cancer Neg Hx    Social History   Socioeconomic History   Marital status: Married    Spouse name: Not on file   Number of children: 2   Years of education: Not on file   Highest education level: Not on file  Occupational History   Occupation: Retired from [employer] in 2008  Tobacco Use   Smoking status: Never   Smokeless tobacco: Never  Vaping Use   Vaping Use: Never used  Substance and Sexual Activity   Alcohol use: No   Drug use: No   Sexual activity: Yes  Other Topics Concern   Not on file  Social History Narrative   Married 1966   2 kids   4 grand children   Retired from [employer]   Social Determinants of Health   Financial Resource Strain: Low Risk  (12/27/2021)   Overall Financial Resource Strain (CARDIA)    Difficulty of Paying Living Expenses: Not hard at all  Food Insecurity: No Food Insecurity (12/27/2021)   Hunger Vital Sign    Worried About Estate manager/land agent of  Food in the Last Year: Never true    Ran Out of Food in the Last Year: Never true  Transportation Needs: No Transportation Needs (12/27/2021)   PRAPARE - Hydrologist (Medical): No    Lack of Transportation (Non-Medical): No  Physical Activity: Insufficiently Active (12/27/2021)   Exercise Vital Sign    Days of Exercise per Week: 3 days    Minutes of Exercise per Session: 30 min  Stress: No Stress Concern Present (12/27/2021)  [place] Questionnaire    Feeling of Stress : Only a little  Social Connections: Moderately Isolated (12/27/2021)   Social Connection and Isolation Panel [NHANES]    Frequency of Communication with Friends and Family: More than three times a week    Frequency of Social Gatherings with Friends and Family: Once a week    Attends Religious Services: Never    Marine scientist or Organizations: No    Attends Music therapist: Never    Marital Status: Married    Tobacco Counseling Counseling given: Not Answered   Clinical Intake:  Pre-visit preparation completed: Yes  Pain : No/denies pain     Nutritional Risks: None Diabetes: No  How often do you need to have someone help you when you read instructions, pamphlets, or other written materials from your doctor or pharmacy?: 1 - Never  Diabetic?no  Interpreter Needed?: No  Information entered by :: Kirke Shaggy, LPN   Activities of Daily Living    12/27/2021   11:33 AM  In your present state of health, do you have any difficulty performing the following activities:  Hearing? 0  Vision? 0  Difficulty concentrating or making decisions? 0  Walking or climbing stairs? 0  Dressing or bathing? 0  Doing errands, shopping? 0  Preparing Food and eating ? N  Using the Toilet? N  In the past six months, have you accidently leaked urine? N  Do you have problems with loss of bowel control? N  Managing your  Medications? N  Managing your Finances? N  Housekeeping or managing your Housekeeping? N    Patient Care Team: [name], MD as PCP - General (Family Medicine) Paula Compton, MD as Consulting Physician (Obstetrics and Gynecology) Webb Laws, [place] (Optometry) Rolm Bookbinder, MD as Consulting Physician (General Surgery) Gery Pray, MD as Consulting Physician (Radiation Oncology) Richmond Campbell, MD as Consulting Physician (Gastroenterology) Benay Pike, MD as Consulting Physician (Hematology and Oncology)  Indicate any recent Medical Services you may have received from other than [facility] providers in the past year (date may be approximate).     Assessment:   This is a routine wellness examination for [place].  Hearing/Vision screen Hearing Screening - Comments:: No aids Vision Screening - Comments:: Wears glasses- Dr.McFarland  Dietary issues and exercise activities discussed: Current Exercise Habits: Home exercise routine, Type of exercise: walking, Time (Minutes): 30, Frequency (Times/Week): 3, Weekly Exercise (Minutes/Week): 90, Intensity: Mild   Goals Addressed             This Visit's Progress    DIET - EAT MORE FRUITS AND VEGETABLES         Depression Screen    12/27/2021   11:30 AM 12/20/2020    1:16 PM 12/28/2019   10:37 AM 12/18/2018   10:05 AM 12/08/2017    8:44 AM 12/04/2016   10:42 AM 05/23/2015    8:48 AM  PHQ 2/9 Scores  PHQ - 2 Score 0 0 0 0 0 0 0  PHQ- 9 Score 0 0   0      Fall Risk    12/27/2021   11:33 AM 12/20/2020    1:15 PM 12/28/2019   10:37 AM 12/18/2018   10:05 AM 12/08/2017    8:44 AM  Fall Risk   Falls in the past year? 0 0 0 0 No  Number falls in past yr: 0 0  0   Injury with Fall? 0 0  0   Risk for fall due to : No Fall Risks No Fall Risks     Follow up Falls evaluation completed Falls evaluation completed;Falls prevention discussed       FALL RISK PREVENTION PERTAINING TO THE HOME:  Any stairs in or around the  home? Yes  If so, are there any without handrails? No  Home free of loose throw rugs in walkways, pet beds, electrical cords, etc? Yes  Adequate lighting in your home to reduce risk of falls? Yes   ASSISTIVE DEVICES UTILIZED TO PREVENT FALLS:  Life alert? No  Use of a cane, walker or w/c? No  Grab bars in the bathroom? Yes  Shower chair or bench in shower? Yes  Elevated toilet seat or a handicapped toilet? Yes    Cognitive Function:    12/20/2020    1:17 PM 12/08/2017    8:44 AM 12/04/2016   10:43 AM  MMSE - Mini Mental State Exam  Orientation to time '5 5 5  '$ Orientation to Place '5 5 5  '$ Registration '3 3 3  '$ Attention/ Calculation 5 0 0  Recall '3 3 2  '$ Language- name 2 objects  0 0  Language- repeat '1 1 1  '$ Language- follow 3 step command  3 3  Language- read & follow direction  0 0  Write a sentence  0 0  Copy design  0 0  Total score  20 19        12/27/2021   11:34 AM  6CIT Screen  What Year? 0 points  What month? 0 points  What time? 0 points  Count back from 20 0 points  Months in reverse 0 points  Repeat phrase 2 points  Total Score 2 points    Immunizations Immunization History  Administered Date(s) Administered   Influenza Whole 04/10/2013   Influenza, High Dose Seasonal PF 02/23/2018, 02/08/2019   Influenza,inj,Quad PF,6+ Mos 05/23/2015, 03/10/2017   Influenza,inj,quad, With Preservative 02/27/2021   Influenza-Unspecified 05/23/2015, 03/10/2017   PFIZER(Purple Top)SARS-COV-2 Vaccination 06/25/2019, 07/16/2019, 05/17/2020   Pneumococcal Conjugate-13 05/23/2015   Pneumococcal Polysaccharide-23 11/02/2012   Tdap 12/05/2010   Zoster Recombinat (Shingrix) 01/11/2019, 05/17/2019   Zoster, Live 06/22/2008, 01/11/2019, 05/17/2019    TDAP status: Due, Education has been provided regarding the importance of this vaccine. Advised may receive this vaccine at local pharmacy or Health Dept. Aware to provide a copy of the vaccination record if obtained from local  pharmacy or Health Dept. Verbalized acceptance and understanding.  Flu Vaccine status: Up to date  Pneumococcal vaccine status: Up to date  Covid-19 vaccine status: Completed vaccines  Qualifies for Shingles Vaccine? Yes   Zostavax completed Yes   Shingrix Completed?: Yes  Screening Tests Health Maintenance  Topic Date Due   COVID-19 Vaccine (4 - Pfizer risk series) 07/12/2020   TETANUS/TDAP  12/04/2020   INFLUENZA VACCINE  12/18/2021   COLONOSCOPY (Pts 45-40yr Insurance coverage will need to be confirmed)  04/04/2024   Pneumonia Vaccine 75 Years old  Completed   DEXA SCAN  Completed   Hepatitis C Screening  Completed   Zoster Vaccines- Shingrix  Completed   HPV VACCINES  Aged Out    Health Maintenance  Health Maintenance Due  Topic Date Due   COVID-19 Vaccine (4 - Pfizer risk series) 07/12/2020   TETANUS/TDAP  12/04/2020   INFLUENZA VACCINE  12/18/2021    Colorectal cancer screening: Type of screening: FOBT/FIT. Completed 07/18/21. Repeat every 1 years  Mammogram status: Completed 07/30/21. Repeat every year  Bone Density status: Ordered 09/11/21. Pt provided with contact info and advised to call to schedule appt.- pt declined referral  Lung Cancer Screening: (Low Dose CT Chest recommended if Age 73-80 years, 30 pack-year currently smoking OR have quit w/in 15years.) does not qualify.    Additional Screening:  Hepatitis C Screening: does qualify; Completed no  Vision Screening: Recommended annual ophthalmology exams for early detection of glaucoma and other disorders of the eye. Is the patient up to date with their annual eye exam?  Yes  Who is the provider or what is the name of the office in which the patient attends annual eye exams? Dr.McFarland If pt is not established with a provider, would they like to be referred to a provider to establish care? No .   Dental Screening: Recommended annual dental exams for proper oral hygiene  Community Resource Referral /  Chronic Care Management: CRR required this visit?  No   CCM required this visit?  No      Plan:     I have personally reviewed and noted the following in the patient's chart:   Medical and social history Use of alcohol, tobacco or illicit drugs  Current medications and supplements including opioid prescriptions.  Functional ability and status Nutritional status Physical activity Advanced directives List of other physicians Hospitalizations, surgeries, and ER visits in previous 12 months Vitals Screenings to include cognitive, depression, and falls Referrals and appointments  In addition, I have reviewed and discussed with patient certain preventive protocols, quality metrics, and best practice recommendations. A written personalized care plan for preventive services as well as general preventive health recommendations were provided to patient.     Dionisio David, LPN   12/02/9676   Nurse Notes: none

## 2022-01-03 ENCOUNTER — Other Ambulatory Visit (INDEPENDENT_AMBULATORY_CARE_PROVIDER_SITE_OTHER): Payer: Medicare Other

## 2022-01-03 DIAGNOSIS — D508 Other iron deficiency anemias: Secondary | ICD-10-CM

## 2022-01-03 DIAGNOSIS — I10 Essential (primary) hypertension: Secondary | ICD-10-CM | POA: Diagnosis not present

## 2022-01-03 DIAGNOSIS — E559 Vitamin D deficiency, unspecified: Secondary | ICD-10-CM

## 2022-01-03 LAB — CBC WITH DIFFERENTIAL/PLATELET
Basophils Absolute: 0 10*3/uL (ref 0.0–0.1)
Basophils Relative: 0.2 % (ref 0.0–3.0)
Eosinophils Absolute: 0.3 10*3/uL (ref 0.0–0.7)
Eosinophils Relative: 5.8 % — ABNORMAL HIGH (ref 0.0–5.0)
HCT: 30.9 % — ABNORMAL LOW (ref 36.0–46.0)
Hemoglobin: 10.1 g/dL — ABNORMAL LOW (ref 12.0–15.0)
Lymphocytes Relative: 15.3 % (ref 12.0–46.0)
Lymphs Abs: 0.8 10*3/uL (ref 0.7–4.0)
MCHC: 32.8 g/dL (ref 30.0–36.0)
MCV: 86.9 fl (ref 78.0–100.0)
Monocytes Absolute: 0.6 10*3/uL (ref 0.1–1.0)
Monocytes Relative: 11.2 % (ref 3.0–12.0)
Neutro Abs: 3.4 10*3/uL (ref 1.4–7.7)
Neutrophils Relative %: 67.5 % (ref 43.0–77.0)
Platelets: 245 10*3/uL (ref 150.0–400.0)
RBC: 3.56 Mil/uL — ABNORMAL LOW (ref 3.87–5.11)
RDW: 15.9 % — ABNORMAL HIGH (ref 11.5–15.5)
WBC: 5.1 10*3/uL (ref 4.0–10.5)

## 2022-01-03 LAB — COMPREHENSIVE METABOLIC PANEL
ALT: 9 U/L (ref 0–35)
AST: 15 U/L (ref 0–37)
Albumin: 3.9 g/dL (ref 3.5–5.2)
Alkaline Phosphatase: 56 U/L (ref 39–117)
BUN: 17 mg/dL (ref 6–23)
CO2: 30 mEq/L (ref 19–32)
Calcium: 9 mg/dL (ref 8.4–10.5)
Chloride: 103 mEq/L (ref 96–112)
Creatinine, Ser: 0.67 mg/dL (ref 0.40–1.20)
GFR: 85.59 mL/min (ref >60.00)
Glucose, Bld: 90 mg/dL (ref 70–99)
Potassium: 3.9 mEq/L (ref 3.5–5.1)
Sodium: 141 mEq/L (ref 135–145)
Total Bilirubin: 1 mg/dL (ref 0.2–1.2)
Total Protein: 6.4 g/dL (ref 6.0–8.3)

## 2022-01-03 LAB — LIPID PANEL
Cholesterol: 165 mg/dL (ref 0–200)
HDL: 46.5 mg/dL (ref >39.00)
LDL Cholesterol: 99 mg/dL (ref 0–99)
NonHDL: 118.9
Total CHOL/HDL Ratio: 4
Triglycerides: 101 mg/dL (ref 0.0–149.0)
VLDL: 20.2 mg/dL (ref 0.0–40.0)

## 2022-01-03 LAB — IRON: Iron: 62 ug/dL (ref 42–145)

## 2022-01-03 LAB — VITAMIN D 25 HYDROXY (VIT D DEFICIENCY, FRACTURES): VITD: 24.12 ng/mL — ABNORMAL LOW (ref 30.00–100.00)

## 2022-01-10 ENCOUNTER — Encounter: Payer: Self-pay | Admitting: Family Medicine

## 2022-01-10 ENCOUNTER — Ambulatory Visit (INDEPENDENT_AMBULATORY_CARE_PROVIDER_SITE_OTHER): Payer: Medicare Other | Admitting: Family Medicine

## 2022-01-10 VITALS — BP 124/82 | HR 65 | Temp 97.6°F | Ht 63.0 in | Wt 114.0 lb

## 2022-01-10 DIAGNOSIS — I10 Essential (primary) hypertension: Secondary | ICD-10-CM

## 2022-01-10 DIAGNOSIS — Z7189 Other specified counseling: Secondary | ICD-10-CM

## 2022-01-10 DIAGNOSIS — D508 Other iron deficiency anemias: Secondary | ICD-10-CM

## 2022-01-10 DIAGNOSIS — E559 Vitamin D deficiency, unspecified: Secondary | ICD-10-CM | POA: Diagnosis not present

## 2022-01-10 DIAGNOSIS — Z0000 Encounter for general adult medical examination without abnormal findings: Secondary | ICD-10-CM

## 2022-01-10 MED ORDER — VITAMIN D (ERGOCALCIFEROL) 1.25 MG (50000 UNIT) PO CAPS
50000.0000 [IU] | ORAL_CAPSULE | ORAL | 0 refills | Status: DC
Start: 2022-01-10 — End: 2022-06-25

## 2022-01-10 MED ORDER — OMEPRAZOLE 40 MG PO CPDR
40.0000 mg | DELAYED_RELEASE_CAPSULE | Freq: Every day | ORAL | Status: DC
Start: 2022-01-10 — End: 2022-06-26

## 2022-01-10 NOTE — Progress Notes (Signed)
Husband has memory changes and wife arranged some extra help at home when she is out of the home.    She is set up with Dr. Lorenso Courier with GI.  She isn't yet on colestipol and dicyclomine.  She is doing well in the meantime.  She had ablation prev and hopes to go 1 year w/o another procedure.  No abd pain in the meantime.   Diarrhea is better in the meantime.  She wanted to see how she did w/o extra medicine, ie not yet on colestipol and dicyclomine.  Her weight is up 1 lbs.  Taking PPI QD.    Taking vit D 50000 units monthly.  Vit D still low, d/w pt about weekly dose and recheck.    Hypertension:    Using medication without problems or lightheadedness: yes Chest pain with exertion:no  Edema: rare, none now Short of breath: no  Hepatitis C screening neg prev.   Flu to be done at pharmacy.   Shingles prev done PNA up to date Covid vaccine prev done Tetanus 2012 Colonoscopy 2020 Breast cancer screening up to date Advance directive- both children equally designated if patient were incapacitated.  DXA done per outside clinic 2018 with osteoporosis.   she wanted to defer, d/w pt.    Meds, vitals, and allergies reviewed.   ROS: Per HPI unless specifically indicated in ROS section   GEN: nad, alert and oriented HEENT: ncat NECK: supple w/o LA CV: rrr PULM: ctab, no inc wob ABD: soft, +bs EXT: no edema SKIN: no acute rash  31 minutes were devoted to patient care in this encounter (this includes time spent reviewing the patient's file/history, interviewing and examining the patient, counseling/reviewing plan with patient).

## 2022-01-10 NOTE — Patient Instructions (Addendum)
Vitamin D weekly and recheck labs in about 3 months.  Take care.  Glad to see you. Update me as needed.   Thanks for your effort.   I would get a flu shot each fall.

## 2022-01-13 NOTE — Assessment & Plan Note (Signed)
Advance directive- both children equally designated if patient were incapacitated.

## 2022-01-13 NOTE — Assessment & Plan Note (Signed)
Hepatitis C screening neg prev.   Flu to be done at pharmacy.   Shingles prev done PNA up to date Covid vaccine prev done Tetanus 2012 Colonoscopy 2020 Breast cancer screening up to date Advance directive- both children equally designated if patient were incapacitated.  DXA done per outside clinic 2018 with osteoporosis.   she wanted to defer, d/w pt.

## 2022-01-13 NOTE — Assessment & Plan Note (Signed)
She is set up with Dr. Lorenso Courier with GI.  She isn't yet on colestipol and dicyclomine.  She is doing well in the meantime.  She had ablation prev and hopes to go 1 year w/o another procedure.  No abd pain in the meantime.   Diarrhea is better in the meantime.  She wanted to see how she did w/o extra medicine, ie not yet on colestipol and dicyclomine.  Her weight is up 1 lbs.  Taking PPI QD.   She wants to continue as is and I think this makes sense.

## 2022-01-13 NOTE — Assessment & Plan Note (Signed)
Continue metoprolol. 

## 2022-01-13 NOTE — Assessment & Plan Note (Signed)
  Taking vit D 50000 units monthly.  Vit D still low, d/w pt about weekly dose and recheck.

## 2022-01-25 ENCOUNTER — Other Ambulatory Visit: Payer: Self-pay | Admitting: Internal Medicine

## 2022-01-28 ENCOUNTER — Other Ambulatory Visit: Payer: Self-pay | Admitting: Family Medicine

## 2022-02-02 ENCOUNTER — Other Ambulatory Visit: Payer: Self-pay | Admitting: Internal Medicine

## 2022-02-12 ENCOUNTER — Ambulatory Visit: Payer: Medicare Other | Admitting: Internal Medicine

## 2022-02-19 ENCOUNTER — Inpatient Hospital Stay: Payer: Medicare Other

## 2022-02-19 ENCOUNTER — Inpatient Hospital Stay: Payer: Medicare Other | Attending: Hematology and Oncology | Admitting: Hematology and Oncology

## 2022-02-19 ENCOUNTER — Encounter: Payer: Self-pay | Admitting: Hematology and Oncology

## 2022-02-19 DIAGNOSIS — D509 Iron deficiency anemia, unspecified: Secondary | ICD-10-CM | POA: Insufficient documentation

## 2022-02-19 DIAGNOSIS — Z08 Encounter for follow-up examination after completed treatment for malignant neoplasm: Secondary | ICD-10-CM | POA: Insufficient documentation

## 2022-02-19 DIAGNOSIS — E559 Vitamin D deficiency, unspecified: Secondary | ICD-10-CM

## 2022-02-19 DIAGNOSIS — Z853 Personal history of malignant neoplasm of breast: Secondary | ICD-10-CM | POA: Diagnosis present

## 2022-02-19 DIAGNOSIS — D508 Other iron deficiency anemias: Secondary | ICD-10-CM

## 2022-02-19 DIAGNOSIS — K31819 Angiodysplasia of stomach and duodenum without bleeding: Secondary | ICD-10-CM | POA: Diagnosis not present

## 2022-02-19 DIAGNOSIS — M810 Age-related osteoporosis without current pathological fracture: Secondary | ICD-10-CM | POA: Insufficient documentation

## 2022-02-19 LAB — CBC WITH DIFFERENTIAL (CANCER CENTER ONLY)
Abs Immature Granulocytes: 0.01 10*3/uL (ref 0.00–0.07)
Basophils Absolute: 0 10*3/uL (ref 0.0–0.1)
Basophils Relative: 0 %
Eosinophils Absolute: 0.3 10*3/uL (ref 0.0–0.5)
Eosinophils Relative: 7 %
HCT: 32.3 % — ABNORMAL LOW (ref 36.0–46.0)
Hemoglobin: 10.3 g/dL — ABNORMAL LOW (ref 12.0–15.0)
Immature Granulocytes: 0 %
Lymphocytes Relative: 17 %
Lymphs Abs: 0.8 10*3/uL (ref 0.7–4.0)
MCH: 28.5 pg (ref 26.0–34.0)
MCHC: 31.9 g/dL (ref 30.0–36.0)
MCV: 89.2 fL (ref 80.0–100.0)
Monocytes Absolute: 0.6 10*3/uL (ref 0.1–1.0)
Monocytes Relative: 14 %
Neutro Abs: 2.9 10*3/uL (ref 1.7–7.7)
Neutrophils Relative %: 62 %
Platelet Count: 271 10*3/uL (ref 150–400)
RBC: 3.62 MIL/uL — ABNORMAL LOW (ref 3.87–5.11)
RDW: 14.6 % (ref 11.5–15.5)
WBC Count: 4.7 10*3/uL (ref 4.0–10.5)
nRBC: 0 % (ref 0.0–0.2)

## 2022-02-19 LAB — SAMPLE TO BLOOD BANK

## 2022-02-19 NOTE — Assessment & Plan Note (Addendum)
Kristi Carr is a 76 year old with anemia indicating iron deficiency that is related to a history of diverticulitis and GAVE (followed at [facility], [facility] in [place]).   Labs from today show stable hemoglobin for the past 6 months.  Clinically she does not appear to have any evidence of active GI bleed.  She will continue to follow-up as needed.  We have discussed about symptoms and signs of iron deficiency anemia.  She will contact us if she develops any of the symptoms.

## 2022-02-19 NOTE — Assessment & Plan Note (Addendum)
She has a history of left breast cancer status postlumpectomy, radiation, 5 years of adjuvant tamoxifen.  Her most recent mammogram was negative for malignancy.  She continues with annual mammograms and has no clinical or radiographic sign of breast cancer recurrence.  Physical examination today unremarkable.  She was encouraged to continue self breast exam monthly and report any new concerns to [place] and to continue annual mammograms.

## 2022-02-19 NOTE — Progress Notes (Signed)
[facility] Follow up:    Kristi Ghent, MD [name]   DIAGNOSIS:  Cancer Staging  History of left breast cancer Staging form: Breast, AJCC 7th Edition - Clinical: Stage IIA (T2, N0, cM0) - Unsigned Specimen type: Core Needle Biopsy Histopathologic type: 9931 Laterality: Left Staging comments: Staged at breast conference 08/11/13.  - Pathologic: No stage assigned - Unsigned Specimen type: Core Needle Biopsy Histopathologic type: 9931 Laterality: Left   SUMMARY OF HEMATOLOGIC/ONCOLOGIC HISTORY:  [place] woman status post left breast biopsy 08/02/2013 for a clinical T2 N0, stage IIA invasive papillary breast cancer, grade 1, estrogen receptor and progesterone receptor both 100% positive, with an MIB-1 of 9% and no HER-2 amplification   (1) Status post left lumpectomy and sentinel lymph node sampling 09/06/2013 for a pT1c pN0, stage IA invasive ductal carcinoma, grade 1, repeat HER-2 again negative   (2) Adjuvant radiation completed 11/30/2013   (3) started tamoxifen 12/18/2013, completing 5 years April 2020   (4) deer tick bite February 2017--no sequela   (5) osteoporosis: Bone density at [facility] 07/02/2017 showed a T score of -2.5   (6) C. difficile colitis--completing second round of antibiotics March 2019   (7)  anemia: as of 01/09/2018, ferritin was 16.2,, iron saturation, B12 and folate are normal as is the reticulocyte count and creatinine.              (a) REVIEW OF BLOOD FILM 01/09/2018 shows mild rouleaux, but no other abnormalities: Specifically there are no tailed poikilocytes or significant anisocytosis, no left shift in the white cell series, and no artifactual platelet counts             (b) ferritin on 07/26/2019 was 3.5 (severe deficiency)             (c) iron sucrose started 08/18/2019, to be repeated x5, received 12/14/2019-01/11/2020, 06/14/2020-08/04/2020, 02/24/2021-03/12/2021             (d) GI  workup:                         (I) colonoscopy 04/05/2019: diverticulosis   CURRENT THERAPY: observation; Venofer PRN  INTERVAL HISTORY:  Kristi Carr 76 y.o. female returns for follow-up of her history of iron deficiency and history of breast cancer  She is doing quite well.  She denies any complaints related to iron deficiency today.  She tells me that she has GAVE and has GI bleed from time to time, but she has been feeling really well.  With regard to osteoporosis, she tells me that she does not want to take any medications.  She only complains of itching in the lower portion of the left breast and she attributes this to radiation related changes.   Most recent mammogram showed no evidence of malignancy. Routine mammographic evaluation in 1 yr is recommended. Rest of the pertinent 10 point ROS reviewed and negative  Patient Active Problem List   Diagnosis Date Noted   Gastric antral vascular ectasia 94/49/6759   Lichen sclerosus of female genitalia 12/03/2019   Female bladder prolapse 12/03/2019   Osteopenia 12/03/2019   Diverticulosis 04/04/2019   Internal hemorrhoids 04/04/2019   Status post cholecystectomy 12/22/2018   History of adenomatous polyp of colon 09/16/2017   Sensorineural hearing loss (SNHL), bilateral 09/10/2017   IDA (iron deficiency anemia) 07/31/2017   Murmur 07/06/2017   Recurrent Clostridium difficile diarrhea 03/11/2017   Healthcare maintenance 12/15/2016  ETD (eustachian tube dysfunction) 05/22/2016   Vaginal pessary in situ 02/14/2016   Advance care planning 05/23/2015   History of left breast cancer 08/11/2013   Diarrhea 11/04/2012   Medicare annual wellness visit, subsequent 11/04/2012   Hypercholesteremia 02/20/2011   HTN (hypertension) 12/19/2010   Vitamin D deficiency 06/22/2008   RENAL CALCULUS 08/19/2003    is allergic to clindamycin/lincomycin.  MEDICAL HISTORY: Past Medical History:  Diagnosis Date   Anemia    Breast cancer of  upper-outer quadrant of left female breast ([place])    C. difficile diarrhea    Colon polyps    Gallstones 08/23/2003   x 2   Gastric hemorrhage due to gastric antral vascular ectasia (GAVE)    s/p radiofrequency ablation    GAVE (gastric antral vascular ectasia)    Hot flashes    Hypertension    Kidney stones    Radiation 10/14/13-12/02/13   left breast 50.4 gray, lumpectomy cavity boosted to 63 gray   Wears glasses     SURGICAL HISTORY: Past Surgical History:  Procedure Laterality Date   BREAST LUMPECTOMY WITH SENTINEL LYMPH NODE BIOPSY Left 09/06/2013   LEFT BREAST SEED GUIDED LUMPECTOMY WITH LEFT AXILLARY SENTINEL NODE BIOPSY (Left)   CHOLECYSTECTOMY     COLONOSCOPY     PARTIAL HYSTERECTOMY  05/20/1978   ovaries left in   TONSILLECTOMY     TUBAL LIGATION     WISDOM TOOTH EXTRACTION      SOCIAL HISTORY: Social History   Socioeconomic History   Marital status: Married    Spouse name: Not on file   Number of children: 2   Years of education: Not on file   Highest education level: Not on file  Occupational History   Occupation: Retired from [employer] in 2008  Tobacco Use   Smoking status: Never   Smokeless tobacco: Never  Vaping Use   Vaping Use: Never used  Substance and Sexual Activity   Alcohol use: No   Drug use: No   Sexual activity: Yes  Other Topics Concern   Not on file  Social History Narrative   Married 1966   2 kids   4 grand children   Retired from [employer]   Social Determinants of Health   Financial Resource Strain: Low Risk  (12/27/2021)   Overall Financial Resource Strain (CARDIA)    Difficulty of Paying Living Expenses: Not hard at all  Food Insecurity: No Food Insecurity (12/27/2021)   Hunger Vital Sign    Worried About Estate manager/land agent of Food in the Last Year: Never true    Ran Out of Food in the Last Year: Never true  Transportation Needs: No Transportation Needs (12/27/2021)   PRAPARE - Hydrologist  (Medical): No    Lack of Transportation (Non-Medical): No  Physical Activity: Insufficiently Active (12/27/2021)   Exercise Vital Sign    Days of Exercise per Week: 3 days    Minutes of Exercise per Session: 30 min  Stress: No Stress Concern Present (12/27/2021)   [place]    Feeling of Stress : Only a little  Social Connections: Moderately Isolated (12/27/2021)   Social Connection and Isolation Panel [NHANES]    Frequency of Communication with Friends and Family: More than three times a week    Frequency of Social Gatherings with Friends and Family: Once a week    Attends Religious Services: Never    Marine scientist or  Organizations: No    Attends Archivist Meetings: Never    Marital Status: Married  Human resources officer Violence: Not At Risk (12/27/2021)   Humiliation, Afraid, Rape, and Kick questionnaire    Fear of Current or Ex-Partner: No    Emotionally Abused: No    Physically Abused: No    Sexually Abused: No    FAMILY HISTORY: Family History  Problem Relation Age of Onset   Diabetes Mother    Hyperlipidemia Mother    Hypertension Mother    Breast cancer Mother    Heart attack Father    Heart disease Father    Hypertension Sister    Diabetes Sister    Heart disease Brother        CHF  EF 10%   Hypertension Brother    Gout Brother    Depression Neg Hx    Alcohol abuse Neg Hx    Drug abuse Neg Hx    Colon cancer Neg Hx    Uterine cancer Neg Hx    Ovarian cancer Neg Hx    Prostate cancer Neg Hx     Review of Systems  Constitutional:  Negative for appetite change, chills, fatigue, fever and unexpected weight change.  HENT:   Negative for hearing loss, lump/mass and trouble swallowing.   Eyes:  Negative for eye problems and icterus.  Respiratory:  Negative for chest tightness, cough and shortness of breath.   Cardiovascular:  Negative for chest pain, leg swelling and palpitations.   Gastrointestinal:  Negative for abdominal distention, abdominal pain, constipation, diarrhea, nausea and vomiting.  Endocrine: Negative for hot flashes.  Genitourinary:  Negative for difficulty urinating.   Musculoskeletal:  Negative for arthralgias.  Skin:  Negative for itching and rash.  Neurological:  Negative for dizziness, extremity weakness, headaches and numbness.  Hematological:  Negative for adenopathy. Does not bruise/bleed easily.  Psychiatric/Behavioral:  Negative for depression. The patient is not nervous/anxious.       PHYSICAL EXAMINATION  ECOG PERFORMANCE STATUS: 1 - Symptomatic but completely ambulatory  Vitals:   02/19/22 1129  BP: (!) 173/81  Pulse: 73  Resp: 16  Temp: 98.4 F (36.9 C)  SpO2: 98%     Physical Exam Constitutional:      General: She is not in acute distress.    Appearance: Normal appearance. She is not toxic-appearing.  HENT:     Head: Normocephalic and atraumatic.  Eyes:     General: No scleral icterus. Cardiovascular:     Rate and Rhythm: Normal rate and regular rhythm.     Pulses: Normal pulses.     Heart sounds: Normal heart sounds.  Pulmonary:     Effort: Pulmonary effort is normal.     Breath sounds: Normal breath sounds.  Chest:     Comments: Bilateral breast examined.  No palpable masses or regional adenopathy.  No concern for recurrence. Abdominal:     General: Abdomen is flat. Bowel sounds are normal. There is no distension.     Palpations: Abdomen is soft.     Tenderness: There is no abdominal tenderness.  Musculoskeletal:        General: No swelling.     Cervical back: Neck supple.  Lymphadenopathy:     Cervical: No cervical adenopathy.  Skin:    General: Skin is warm and dry.     Findings: No rash.  Neurological:     General: No focal deficit present.     Mental Status: She is alert.  Psychiatric:  Mood and Affect: Mood normal.        Behavior: Behavior normal.     LABORATORY DATA:  CBC     Component Value Date/Time   WBC 4.7 02/19/2022 1108   WBC 5.1 01/03/2022 0935   RBC 3.62 (L) 02/19/2022 1108   HGB 10.3 (L) 02/19/2022 1108   HGB 10.0 (L) 08/01/2016 1016   HCT 32.3 (L) 02/19/2022 1108   HCT 30.1 (L) 08/01/2016 1016   PLT 271 02/19/2022 1108   PLT 239 08/01/2016 1016   MCV 89.2 02/19/2022 1108   MCV 90.2 08/01/2016 1016   MCH 28.5 02/19/2022 1108   MCHC 31.9 02/19/2022 1108   RDW 14.6 02/19/2022 1108   RDW 13.5 08/01/2016 1016   LYMPHSABS 0.8 02/19/2022 1108   LYMPHSABS 0.9 08/01/2016 1016   MONOABS 0.6 02/19/2022 1108   MONOABS 0.5 08/01/2016 1016   EOSABS 0.3 02/19/2022 1108   EOSABS 0.1 08/01/2016 1016   BASOSABS 0.0 02/19/2022 1108   BASOSABS 0.0 08/01/2016 1016    CMP     Component Value Date/Time   NA 141 01/03/2022 0935   NA 143 08/01/2016 1016   K 3.9 01/03/2022 0935   K 3.8 08/01/2016 1016   CL 103 01/03/2022 0935   CO2 30 01/03/2022 0935   CO2 29 08/01/2016 1016   GLUCOSE 90 01/03/2022 0935   GLUCOSE 77 08/01/2016 1016   BUN 17 01/03/2022 0935   BUN 13.6 08/01/2016 1016   CREATININE 0.67 01/03/2022 0935   CREATININE 0.70 06/08/2021 1023   CREATININE 0.8 08/01/2016 1016   CALCIUM 9.0 01/03/2022 0935   CALCIUM 9.5 08/01/2016 1016   PROT 6.4 01/03/2022 0935   PROT 7.0 08/01/2016 1016   ALBUMIN 3.9 01/03/2022 0935   ALBUMIN 3.6 08/01/2016 1016   AST 15 01/03/2022 0935   AST 12 (L) 06/08/2021 1023   AST 18 08/01/2016 1016   ALT 9 01/03/2022 0935   ALT 7 06/08/2021 1023   ALT 13 08/01/2016 1016   ALKPHOS 56 01/03/2022 0935   ALKPHOS 45 08/01/2016 1016   BILITOT 1.0 01/03/2022 0935   BILITOT 1.0 06/08/2021 1023   BILITOT 0.44 08/01/2016 1016   GFRNONAA >60 07/18/2021 0510   GFRNONAA >60 06/08/2021 1023   GFRAA >60 10/02/2019 0957   GFRAA >60 08/06/2018 1044     ASSESSMENT and THERAPY PLAN:   IDA (iron deficiency anemia) Kristi Carr is a 76 year old with anemia indicating iron deficiency that is related to a history of  diverticulitis and GAVE (followed at [facility], [place] in [place]).   Labs from today show stable hemoglobin for the past 6 months.  Clinically she does not appear to have any evidence of active GI bleed.  She will continue to follow-up as needed.  We have discussed about symptoms and signs of iron deficiency anemia.  She will contact us if she develops any of the symptoms.  History of left breast cancer She has a history of left breast cancer status postlumpectomy, radiation, 5 years of adjuvant tamoxifen.  Her most recent mammogram was negative for malignancy.  She continues with annual mammograms and has no clinical or radiographic sign of breast cancer recurrence.  Physical examination today unremarkable.  She was encouraged to continue self breast exam monthly and report any new concerns to [place] and to continue annual mammograms.   Total encounter time:30 minutes*in face-to-face visit time, chart review, lab review, care coordination, order entry, and documentation of the encounter time.   *Total Encounter Time as defined  by the Centers for Medicare and Medicaid Services includes, in addition to the face-to-face time of a patient visit (documented in the note above) non-face-to-face time: obtaining and reviewing outside history, ordering and reviewing medications, tests or procedures, care coordination (communications with other health care professionals or caregivers) and documentation in the medical record.

## 2022-02-24 ENCOUNTER — Other Ambulatory Visit: Payer: Self-pay | Admitting: Family Medicine

## 2022-02-24 DIAGNOSIS — D508 Other iron deficiency anemias: Secondary | ICD-10-CM

## 2022-03-03 ENCOUNTER — Telehealth: Payer: Self-pay | Admitting: Family Medicine

## 2022-03-03 DIAGNOSIS — I723 Aneurysm of iliac artery: Secondary | ICD-10-CM

## 2022-03-03 NOTE — Telephone Encounter (Signed)
Please check with patient.  She had a small aneurysm on the right internal iliac artery on prior imaging.  Recommend follow-up CTA.  I have the order pended below.  I wanted her to get a call from [place] before I signed off on the order.  Please let me know if she consents and I will sign the order.  Thanks.

## 2022-03-04 NOTE — Telephone Encounter (Signed)
Spoke with patient she is agreeable to a follow up CTA.

## 2022-03-04 NOTE — Telephone Encounter (Signed)
Unable to reach patient. Left voicemail to return call to our office.   

## 2022-03-05 NOTE — Telephone Encounter (Signed)
Signed. Thanks.

## 2022-03-15 ENCOUNTER — Ambulatory Visit
Admission: RE | Admit: 2022-03-15 | Discharge: 2022-03-15 | Disposition: A | Discharge status: Home / Self Care | Payer: Medicare Other | Source: Ambulatory Visit | Attending: Family Medicine | Admitting: Family Medicine

## 2022-03-15 DIAGNOSIS — I723 Aneurysm of iliac artery: Secondary | ICD-10-CM | POA: Diagnosis present

## 2022-03-15 LAB — POCT I-STAT CREATININE: Creatinine, Ser: 0.8 mg/dL (ref 0.44–1.00)

## 2022-03-15 MED ORDER — IOHEXOL 350 MG/ML SOLN
100.0000 mL | Freq: Once | INTRAVENOUS | Status: AC | PRN
  Administered 2022-03-15: 100 mL via INTRAVENOUS

## 2022-03-29 ENCOUNTER — Telehealth: Payer: Self-pay

## 2022-03-29 ENCOUNTER — Other Ambulatory Visit (HOSPITAL_COMMUNITY): Payer: Self-pay

## 2022-03-29 ENCOUNTER — Encounter: Payer: Self-pay | Admitting: Oncology

## 2022-03-29 NOTE — Telephone Encounter (Signed)
Pharmacy Patient Advocate Encounter  Prior Authorization for Pantoprazole '40mg'$  has been approved.    PA# [id number] Effective dates: 03/29/2022 through 05/20/2023

## 2022-03-29 NOTE — Telephone Encounter (Signed)
Patient Advocate Encounter   Received notification from [facility] that prior authorization for Diphenoxylate-Atropine 2.5-0.'02mg'$  is required.   PA submitted on 03/29/2022 Key [id number] Status is pending       Joneen Boers, [place] Patient Advocate Specialist [place] Patient Advocate Team Direct Number: [phone] Fax: [phone]

## 2022-04-15 ENCOUNTER — Other Ambulatory Visit: Payer: Medicare Other

## 2022-05-03 ENCOUNTER — Other Ambulatory Visit (INDEPENDENT_AMBULATORY_CARE_PROVIDER_SITE_OTHER): Payer: Medicare Other

## 2022-05-03 DIAGNOSIS — D508 Other iron deficiency anemias: Secondary | ICD-10-CM

## 2022-05-03 DIAGNOSIS — E559 Vitamin D deficiency, unspecified: Secondary | ICD-10-CM

## 2022-05-03 LAB — CBC WITH DIFFERENTIAL/PLATELET
Basophils Absolute: 0 10*3/uL (ref 0.0–0.1)
Basophils Relative: 0.4 % (ref 0.0–3.0)
Eosinophils Absolute: 0.3 10*3/uL (ref 0.0–0.7)
Eosinophils Relative: 8.1 % — ABNORMAL HIGH (ref 0.0–5.0)
HCT: 25.1 % — ABNORMAL LOW (ref 36.0–46.0)
Hemoglobin: 8.1 g/dL — ABNORMAL LOW (ref 12.0–15.0)
Lymphocytes Relative: 20 % (ref 12.0–46.0)
Lymphs Abs: 0.7 10*3/uL (ref 0.7–4.0)
MCHC: 32.1 g/dL (ref 30.0–36.0)
MCV: 78.8 fl (ref 78.0–100.0)
Monocytes Absolute: 0.5 10*3/uL (ref 0.1–1.0)
Monocytes Relative: 14.9 % — ABNORMAL HIGH (ref 3.0–12.0)
Neutro Abs: 1.9 10*3/uL (ref 1.4–7.7)
Neutrophils Relative %: 56.6 % (ref 43.0–77.0)
Platelets: 381 10*3/uL (ref 150.0–400.0)
RBC: 3.19 Mil/uL — ABNORMAL LOW (ref 3.87–5.11)
RDW: 16.5 % — ABNORMAL HIGH (ref 11.5–15.5)
WBC: 3.3 10*3/uL — ABNORMAL LOW (ref 4.0–10.5)

## 2022-05-03 LAB — IRON: Iron: 17 ug/dL — ABNORMAL LOW (ref 42–145)

## 2022-05-03 LAB — VITAMIN D 25 HYDROXY (VIT D DEFICIENCY, FRACTURES): VITD: 83.58 ng/mL (ref 30.00–100.00)

## 2022-05-03 LAB — FERRITIN: Ferritin: 3.2 ng/mL — ABNORMAL LOW (ref 10.0–291.0)

## 2022-05-06 ENCOUNTER — Telehealth: Payer: Self-pay | Admitting: Family Medicine

## 2022-05-06 ENCOUNTER — Telehealth: Payer: Self-pay | Admitting: Adult Health

## 2022-05-06 NOTE — Telephone Encounter (Signed)
Spoke with patient about lab results.

## 2022-05-06 NOTE — Telephone Encounter (Signed)
Contacted patient to scheduled appointments. Patient is aware of appointments that are scheduled.   

## 2022-05-06 NOTE — Telephone Encounter (Signed)
Patient called and stated she has viewed her lab results and wanted to know what her next steps would be.

## 2022-05-07 ENCOUNTER — Inpatient Hospital Stay: Payer: Medicare Other

## 2022-05-07 ENCOUNTER — Other Ambulatory Visit: Payer: Self-pay

## 2022-05-07 ENCOUNTER — Encounter: Payer: Self-pay | Admitting: Adult Health

## 2022-05-07 ENCOUNTER — Inpatient Hospital Stay: Payer: Medicare Other | Attending: Hematology and Oncology | Admitting: Adult Health

## 2022-05-07 VITALS — BP 184/96 | HR 78 | Temp 97.2°F | Resp 16 | Ht 63.0 in | Wt 113.4 lb

## 2022-05-07 VITALS — BP 151/67 | HR 66 | Resp 16

## 2022-05-07 DIAGNOSIS — Z08 Encounter for follow-up examination after completed treatment for malignant neoplasm: Secondary | ICD-10-CM | POA: Diagnosis present

## 2022-05-07 DIAGNOSIS — K31819 Angiodysplasia of stomach and duodenum without bleeding: Secondary | ICD-10-CM | POA: Diagnosis not present

## 2022-05-07 DIAGNOSIS — D508 Other iron deficiency anemias: Secondary | ICD-10-CM | POA: Diagnosis not present

## 2022-05-07 DIAGNOSIS — D500 Iron deficiency anemia secondary to blood loss (chronic): Secondary | ICD-10-CM | POA: Insufficient documentation

## 2022-05-07 DIAGNOSIS — M1990 Unspecified osteoarthritis, unspecified site: Secondary | ICD-10-CM | POA: Insufficient documentation

## 2022-05-07 DIAGNOSIS — I7300 Raynaud's syndrome without gangrene: Secondary | ICD-10-CM | POA: Insufficient documentation

## 2022-05-07 DIAGNOSIS — Z853 Personal history of malignant neoplasm of breast: Secondary | ICD-10-CM | POA: Diagnosis present

## 2022-05-07 DIAGNOSIS — M359 Systemic involvement of connective tissue, unspecified: Secondary | ICD-10-CM | POA: Insufficient documentation

## 2022-05-07 DIAGNOSIS — K31811 Angiodysplasia of stomach and duodenum with bleeding: Secondary | ICD-10-CM | POA: Insufficient documentation

## 2022-05-07 MED ORDER — SODIUM CHLORIDE 0.9 % IV SOLN: Freq: Once | INTRAVENOUS | Status: AC

## 2022-05-07 MED ORDER — SODIUM CHLORIDE 0.9 % IV SOLN
400.0000 mg | Freq: Once | INTRAVENOUS | Status: AC
  Administered 2022-05-07: 400 mg via INTRAVENOUS
  Filled 2022-05-07: qty 20

## 2022-05-07 NOTE — Assessment & Plan Note (Signed)
Her ferritin is 3.5, and this is secondary to Gastric Antral Vascular Ectasia (GAVE).  She is asymptomatic in regard to her hemoglobin of 8.1.  We will give her Venofer '400mg'$  x 3.  She will proceed with this, f/u with Dr. Newman Pies at [facility].  We will see her back in 6 weeks for labs and f/u.

## 2022-05-07 NOTE — Progress Notes (Signed)
Kristi Carr Cancer Follow up:    Kristi Ghent, MD [place] [address]   DIAGNOSIS:  Cancer Staging  History of left breast cancer Staging form: Breast, AJCC 7th Edition - Clinical: Stage IIA (T2, N0, cM0) - Unsigned Specimen type: Core Needle Biopsy Histopathologic type: 9931 Laterality: Left Staging comments: Staged at breast conference 08/11/13.  - Pathologic: No stage assigned - Unsigned Specimen type: Core Needle Biopsy Histopathologic type: 9931 Laterality: Left   SUMMARY OF HEMATOLOGIC/ONCOLOGIC HISTORY: [place] woman status post left breast biopsy 08/02/2013 for a clinical T2 N0, stage IIA invasive papillary breast cancer, grade 1, estrogen receptor and progesterone receptor both 100% positive, with an MIB-1 of 9% and no HER-2 amplification   (1) Status post left lumpectomy and sentinel lymph node sampling 09/06/2013 for a pT1c pN0, stage IA invasive ductal carcinoma, grade 1, repeat HER-2 again negative   (2) Adjuvant radiation completed 11/30/2013   (3) started tamoxifen 12/18/2013, completing 5 years April 2020   (4)  anemia: as of 01/09/2018, ferritin was 16.2,, iron saturation, B12 and folate are normal as is the reticulocyte count and creatinine.              (a) REVIEW OF BLOOD FILM 01/09/2018 shows mild rouleaux, but no other abnormalities: Specifically there are no tailed poikilocytes or significant anisocytosis, no left shift in the white cell series, and no artifactual platelet counts             (b) ferritin on 07/26/2019 was 3.5 (severe deficiency)             (c) iron sucrose started 08/18/2019, to be repeated x5, received 12/14/2019-01/11/2020, 06/14/2020-08/04/2020, 02/24/2021-03/12/2021             (d) GI workup:                        () colonoscopy 04/05/2019: diverticulosis   () Diagnosed in 2021 with Gastric hemorrhage due to gastric antral vascular ectasia--sees Dr. Newman Pies for radiofrequency ablation of the  areas in question (last performed 11/2021)  CURRENT THERAPY: observation for h/o left breast cancer; intermittent IV iron for iron deficiency anemia  INTERVAL HISTORY: Kristi Carr 76 y.o. female returns for follow-up of her history of left-sided breast cancer and iron deficiency anemia.  Her most recent mammogram occurred on July 30, 2021 demonstrating no mammographic evidence of malignancy and breast density category C.  In regard to her iron deficiency she underwent lab work with her primary care provider on December 15 that demonstrated worsening in her hemoglobin from 10.3 in October to 8.1 on December 15.  Her ferritin has also declined from 201 on April 3 down to 3.2 on December 15.  She has not noticed any blood in her stool.  Her last radiofrquency ablation occurred in July of this year.  She sees Dr. Newman Pies at [facility] for this.  She denies any shortness of breath on exertion, headaches, bowel/bladder changes, or new fatigue.     Patient Active Problem List   Diagnosis Date Noted   Connective tissue disease ([place]) 05/07/2022   Osteoarthritis 05/07/2022   Raynaud's disease 05/07/2022   Gastric antral vascular ectasia 35/70/1779   Lichen sclerosus of female genitalia 12/03/2019   Female bladder prolapse 12/03/2019   Osteopenia 12/03/2019   Diverticulosis 04/04/2019   Internal hemorrhoids 04/04/2019   Status post cholecystectomy 12/22/2018   History of adenomatous polyp of colon 09/16/2017   Sensorineural  hearing loss (SNHL), bilateral 09/10/2017   IDA (iron deficiency anemia) 07/31/2017   Murmur 07/06/2017   Recurrent Clostridium difficile diarrhea 03/11/2017   Healthcare maintenance 12/15/2016   ETD (eustachian tube dysfunction) 05/22/2016   Vaginal pessary in situ 02/14/2016   Advance care planning 05/23/2015   History of left breast cancer 08/11/2013   Diarrhea 11/04/2012   Medicare annual wellness visit, subsequent 11/04/2012   Hypercholesteremia 02/20/2011   HTN  (hypertension) 12/19/2010   Vitamin D deficiency 06/22/2008   RENAL CALCULUS 08/19/2003    is allergic to clindamycin/lincomycin.  MEDICAL HISTORY: Past Medical History:  Diagnosis Date   Anemia    Breast cancer of upper-outer quadrant of left female breast ([place])    C. difficile diarrhea    Colon polyps    Gallstones 08/23/2003   x 2   Gastric hemorrhage due to gastric antral vascular ectasia (GAVE)    s/p radiofrequency ablation    GAVE (gastric antral vascular ectasia)    Hot flashes    Hypertension    Kidney stones    Radiation 10/14/13-12/02/13   left breast 50.4 gray, lumpectomy cavity boosted to 63 gray   Wears glasses     SURGICAL HISTORY: Past Surgical History:  Procedure Laterality Date   BREAST LUMPECTOMY WITH SENTINEL LYMPH NODE BIOPSY Left 09/06/2013   LEFT BREAST SEED GUIDED LUMPECTOMY WITH LEFT AXILLARY SENTINEL NODE BIOPSY (Left)   CHOLECYSTECTOMY     COLONOSCOPY     PARTIAL HYSTERECTOMY  05/20/1978   ovaries left in   TONSILLECTOMY     TUBAL LIGATION     WISDOM TOOTH EXTRACTION      SOCIAL HISTORY: Social History   Socioeconomic History   Marital status: Married    Spouse name: Not on file   Number of children: 2   Years of education: Not on file   Highest education level: Not on file  Occupational History   Occupation: Retired from [employer] in 2008  Tobacco Use   Smoking status: Never   Smokeless tobacco: Never  Vaping Use   Vaping Use: Never used  Substance and Sexual Activity   Alcohol use: No   Drug use: No   Sexual activity: Yes  Other Topics Concern   Not on file  Social History Narrative   Married 1966   2 kids   4 grand children   Retired from [employer]   Social Determinants of Health   Financial Resource Strain: Low Risk  (12/27/2021)   Overall Financial Resource Strain (CARDIA)    Difficulty of Paying Living Expenses: Not hard at all  Food Insecurity: No Food Insecurity (12/27/2021)   Hunger Vital Sign    Worried  About Estate manager/land agent of Food in the Last Year: Never true    Ran Out of Food in the Last Year: Never true  Transportation Needs: No Transportation Needs (12/27/2021)   PRAPARE - Hydrologist (Medical): No    Lack of Transportation (Non-Medical): No  Physical Activity: Insufficiently Active (12/27/2021)   Exercise Vital Sign    Days of Exercise per Week: 3 days    Minutes of Exercise per Session: 30 min  Stress: No Stress Concern Present (12/27/2021)   [place]    Feeling of Stress : Only a little  Social Connections: Moderately Isolated (12/27/2021)   Social Connection and Isolation Panel [NHANES]    Frequency of Communication with Friends and Family: More than three times a  week    Frequency of Social Gatherings with Friends and Family: Once a week    Attends Religious Services: Never    Marine scientist or Organizations: No    Attends Archivist Meetings: Never    Marital Status: Married  Human resources officer Violence: Not At Risk (12/27/2021)   Humiliation, Afraid, Rape, and Kick questionnaire    Fear of Current or Ex-Partner: No    Emotionally Abused: No    Physically Abused: No    Sexually Abused: No    FAMILY HISTORY: Family History  Problem Relation Age of Onset   Diabetes Mother    Hyperlipidemia Mother    Hypertension Mother    Breast cancer Mother    Heart attack Father    Heart disease Father    Hypertension Sister    Diabetes Sister    Heart disease Brother        CHF  EF 10%   Hypertension Brother    Gout Brother    Depression Neg Hx    Alcohol abuse Neg Hx    Drug abuse Neg Hx    Colon cancer Neg Hx    Uterine cancer Neg Hx    Ovarian cancer Neg Hx    Prostate cancer Neg Hx     Review of Systems  Constitutional:  Negative for appetite change, chills, fatigue, fever and unexpected weight change.  HENT:   Negative for hearing loss, lump/mass and  trouble swallowing.   Eyes:  Negative for eye problems and icterus.  Respiratory:  Negative for chest tightness, cough and shortness of breath.   Cardiovascular:  Negative for chest pain, leg swelling and palpitations.  Gastrointestinal:  Negative for abdominal distention, abdominal pain, constipation, diarrhea, nausea and vomiting.  Endocrine: Negative for hot flashes.  Genitourinary:  Negative for difficulty urinating.   Musculoskeletal:  Negative for arthralgias.  Skin:  Negative for itching and rash.  Neurological:  Negative for dizziness, extremity weakness, headaches and numbness.  Hematological:  Negative for adenopathy. Does not bruise/bleed easily.  Psychiatric/Behavioral:  Negative for depression. The patient is not nervous/anxious.       PHYSICAL EXAMINATION  ECOG PERFORMANCE STATUS: 0 - Asymptomatic  Vitals:   05/07/22 1012  BP: (!) 184/96  Pulse: 78  Resp: 16  Temp: (!) 97.2 F (36.2 C)  SpO2: 96%  BP manually 182/84  Physical Exam Constitutional:      General: She is not in acute distress.    Appearance: Normal appearance. She is not toxic-appearing.  HENT:     Head: Normocephalic and atraumatic.  Eyes:     General: No scleral icterus. Cardiovascular:     Rate and Rhythm: Normal rate and regular rhythm.     Pulses: Normal pulses.     Heart sounds: Normal heart sounds.  Pulmonary:     Effort: Pulmonary effort is normal.     Breath sounds: Normal breath sounds.  Abdominal:     General: Abdomen is flat. Bowel sounds are normal. There is no distension.     Palpations: Abdomen is soft.     Tenderness: There is no abdominal tenderness.  Musculoskeletal:        General: No swelling.     Cervical back: Neck supple.  Lymphadenopathy:     Cervical: No cervical adenopathy.  Skin:    General: Skin is warm and dry.     Findings: No rash.  Neurological:     General: No focal deficit present.  Mental Status: She is alert.  Psychiatric:        Mood and  Affect: Mood normal.        Behavior: Behavior normal.     LABORATORY DATA:  CBC    Component Value Date/Time   WBC 3.3 (L) 05/03/2022 0858   RBC 3.19 (L) 05/03/2022 0858   HGB 8.1 Repeated and verified X2. (L) 05/03/2022 0858   HGB 10.3 (L) 02/19/2022 1108   HGB 10.0 (L) 08/01/2016 1016   HCT 25.1 (L) 05/03/2022 0858   HCT 30.1 (L) 08/01/2016 1016   PLT 381.0 05/03/2022 0858   PLT 271 02/19/2022 1108   PLT 239 08/01/2016 1016   MCV 78.8 05/03/2022 0858   MCV 90.2 08/01/2016 1016   MCH 28.5 02/19/2022 1108   MCHC 32.1 05/03/2022 0858   RDW 16.5 (H) 05/03/2022 0858   RDW 13.5 08/01/2016 1016   LYMPHSABS 0.7 05/03/2022 0858   LYMPHSABS 0.9 08/01/2016 1016   MONOABS 0.5 05/03/2022 0858   MONOABS 0.5 08/01/2016 1016   EOSABS 0.3 05/03/2022 0858   EOSABS 0.1 08/01/2016 1016   BASOSABS 0.0 05/03/2022 0858   BASOSABS 0.0 08/01/2016 1016     ASSESSMENT and THERAPY PLAN:   Gastric antral vascular ectasia This is the cause for the patient's iron deficiency.  She follows with Dr. Newman Pies at [facility] for radiofrequency ablation.  She typically calls him when her iron drops for an appointment.  She plans on reaching out to get back in with his office.    History of left breast cancer No sign of breast cancer recurrence.  She continues to undergo annual mammograms next due in 07/2022.    IDA (iron deficiency anemia) Her ferritin is 3.5, and this is secondary to Gastric Antral Vascular Ectasia (GAVE).  She is asymptomatic in regard to her hemoglobin of 8.1.  We will give her Venofer 468m x 3.  She will proceed with this, f[facility]  We will see her back in 6 weeks for labs and f/u.    All questions were answered. The patient knows to call the clinic with any problems, questions or concerns. We can certainly see the patient much sooner if necessary.  Total encounter time:30 minutes*in face-to-face visit time, chart review, lab review, care coordination, order  entry, and documentation of the encounter time.  LWilber Bihari NP 05/07/22 10:58 AM Medical Oncology and Hematology [facility] [place] [address]Tel. [phone]   Fax. [phone] *Total Encounter Time as defined by the Centers for Medicare and Medicaid Services includes, in addition to the face-to-face time of a patient visit (documented in the note above) non-face-to-face time: obtaining and reviewing outside history, ordering and reviewing medications, tests or procedures, care coordination (communications with other health care professionals or caregivers) and documentation in the medical record.

## 2022-05-07 NOTE — Patient Instructions (Signed)
Anemia  Anemia is a condition in which there are not enough red blood cells or hemoglobin in the blood. Hemoglobin is a substance in red blood cells that carries oxygen. When you do not have enough red blood cells or hemoglobin (are anemic), your body cannot get enough oxygen, and your organs may not work properly. As a result, you may feel very tired or have other problems. What are the causes? Common causes of anemia include: Excessive bleeding. Anemia can be caused by excessive bleeding inside or outside the body, including bleeding from the intestines or from heavy menstrual periods in females. Poor nutrition. Long-lasting (chronic) kidney, thyroid, and liver disease. Bone marrow disorders, spleen problems, and blood disorders. Cancer and treatments for cancer. Human immunodeficiency virus (HIV) and acquired immunodeficiency syndrome (AIDS). Infections, medicines, and autoimmune disorders that destroy red blood cells. What are the signs or symptoms? Symptoms of this condition include: Minor weakness. Dizziness. Headache, or difficulties concentrating and sleeping. Heartbeats that feel irregular or faster than normal (palpitations). Shortness of breath, especially with exercise. Pale skin, lips, and nails, or cold hands and feet. Upset stomach (indigestion) and nausea. Symptoms may occur suddenly or develop slowly. If your anemia is mild, you may not have symptoms. How is this diagnosed? This condition is diagnosed based on blood tests, your medical history, and a physical exam. In some cases, a test may be needed in which cells are removed from the soft tissue inside of a bone and looked at under a microscope (bone marrow biopsy). Your health care provider may also check your stool (feces) for blood and may do more testing to look for the cause of your bleeding. Other tests may include: Imaging tests, such as a CT scan or MRI. A procedure to see inside your esophagus and stomach  (endoscopy). The esophagus is the part of the body that moves food from your mouth to your stomach. A procedure to see inside your colon and rectum (colonoscopy). How is this treated? Treatment for this condition depends on the cause. If you continue to lose a lot of blood, you may need to be treated at a hospital. Treatment may include: Taking supplements of iron, vitamin B52, or folic acid. Taking a hormone medicine (erythropoietin) that can help to stimulate red blood cell growth. Receiving donated blood through an IV (blood transfusion). This may be needed if you lose a lot of blood. Making changes to your diet. Having surgery to remove your spleen. Follow these instructions at home: Take over-the-counter and prescription medicines only as told by your health care provider. Take supplements only as told by your health care provider. Follow any diet instructions that you were given by your health care provider. Keep all follow-up visits. Your health care provider will want to recheck your blood tests. Contact a health care provider if: You develop new bleeding anywhere in the body. You are very weak. Get help right away if: You are short of breath. You have pain in your abdomen or chest. You are dizzy or feel faint. You have trouble concentrating. You have bloody stools, black stools, or tarry stools. You vomit repeatedly or you vomit up blood. These symptoms may be an emergency. Get help right away. Call 911. Do not wait to see if the symptoms will go away. Do not drive yourself to the hospital. Summary Anemia is a condition in which you do not have enough red blood cells or enough of a substance in your red blood cells that carries oxygen.  Symptoms may occur suddenly or develop slowly. If your anemia is mild, you may not have symptoms. This condition is diagnosed with blood tests, a medical history, and a physical exam. Other tests may be needed. Treatment for this condition  depends on the cause of the anemia. This information is not intended to replace advice given to you by your health care provider. Make sure you discuss any questions you have with your health care provider. Document Revised: 07/30/2021 Document Reviewed: 07/30/2021 Elsevier Patient Education  [place]. Iron Sucrose Injection What is this medication? IRON SUCROSE (EYE ern SOO krose) treats low levels of iron (iron deficiency anemia) in people with kidney disease. Iron is a mineral that plays an important role in making red blood cells, which carry oxygen from your lungs to the rest of your body. This medicine may be used for other purposes; ask your health care provider or pharmacist if you have questions. COMMON BRAND NAME(S): Venofer What should I tell my care team before I take this medication? They need to know if you have any of these conditions: Anemia not caused by low iron levels Heart disease High levels of iron in the blood Kidney disease Liver disease An unusual or allergic reaction to iron, other medications, foods, dyes, or preservatives Pregnant or trying to get pregnant Breastfeeding How should I use this medication? This medication is for infusion into a vein. It is given in a hospital or clinic setting. Talk to your care team about the use of this medication in children. While this medication may be prescribed for children as young as 2 years for selected conditions, precautions do apply. Overdosage: If you think you have taken too much of this medicine contact a poison control center or emergency room at once. NOTE: This medicine is only for you. Do not share this medicine with others. What if I miss a dose? Keep appointments for follow-up doses. It is important not to miss your dose. Call your care team if you are unable to keep an appointment. What may interact with this medication? Do not take this medication with any of the  following: Deferoxamine Dimercaprol Other iron products This medication may also interact with the following: Chloramphenicol Deferasirox This list may not describe all possible interactions. Give your health care provider a list of all the medicines, herbs, non-prescription drugs, or dietary supplements you use. Also tell them if you smoke, drink alcohol, or use illegal drugs. Some items may interact with your medicine. What should I watch for while using this medication? Visit your care team regularly. Tell your care team if your symptoms do not start to get better or if they get worse. You may need blood work done while you are taking this medication. You may need to follow a special diet. Talk to your care team. Foods that contain iron include: whole grains/cereals, dried fruits, beans, or peas, leafy green vegetables, and organ meats (liver, kidney). What side effects may I notice from receiving this medication? Side effects that you should report to your care team as soon as possible: Allergic reactions--skin rash, itching, hives, swelling of the face, lips, tongue, or throat Low blood pressure--dizziness, feeling faint or lightheaded, blurry vision Shortness of breath Side effects that usually do not require medical attention (report to your care team if they continue or are bothersome): Flushing Headache Joint pain Muscle pain Nausea Pain, redness, or irritation at injection site This list may not describe all possible side effects. Call your doctor  for medical advice about side effects. You may report side effects to FDA at 1-800-FDA-1088. Where should I keep my medication? This medication is given in a hospital or clinic and will not be stored at home. NOTE: This sheet is a summary. It may not cover all possible information. If you have questions about this medicine, talk to your doctor, pharmacist, or health care provider.  2023 Elsevier/Gold Standard (2020-08-17 00:00:00)

## 2022-05-07 NOTE — Patient Instructions (Signed)

## 2022-05-07 NOTE — Assessment & Plan Note (Signed)
This is the cause for the patient's iron deficiency.  She follows with Dr. Newman Pies at [facility] for radiofrequency ablation.  She typically calls him when her iron drops for an appointment.  She plans on reaching out to get back in with his office.

## 2022-05-07 NOTE — Assessment & Plan Note (Signed)
No sign of breast cancer recurrence.  She continues to undergo annual mammograms next due in 07/2022.

## 2022-05-08 ENCOUNTER — Other Ambulatory Visit: Payer: Self-pay

## 2022-05-08 ENCOUNTER — Telehealth: Payer: Self-pay

## 2022-05-08 DIAGNOSIS — D508 Other iron deficiency anemias: Secondary | ICD-10-CM

## 2022-05-08 NOTE — Telephone Encounter (Signed)
Pt called reporting that she feels weak with no other symptoms noted. Patient had iron infusion and visit with Mendel Ryder NP on 05/07/22. Patient asymptomatic at this appointment. Today patient requesting blood infusion because she does not want to end up in ED on Christmas.  Spoke with Dr. Chryl Heck and patient to come in for CBC on 05/15/22. It is patient's understanding hat she has not been scheduled for additional iron treatments.  Routed to [facility] NP and Dr. Chryl Heck

## 2022-05-10 ENCOUNTER — Telehealth: Payer: Self-pay | Admitting: Hematology and Oncology

## 2022-05-10 NOTE — Telephone Encounter (Signed)
Scheduled appointment per 12/19 los. Patient is aware. 

## 2022-05-15 ENCOUNTER — Other Ambulatory Visit: Payer: Self-pay

## 2022-05-15 ENCOUNTER — Other Ambulatory Visit: Payer: Self-pay | Admitting: *Deleted

## 2022-05-15 ENCOUNTER — Inpatient Hospital Stay: Payer: Medicare Other

## 2022-05-15 DIAGNOSIS — D509 Iron deficiency anemia, unspecified: Secondary | ICD-10-CM

## 2022-05-15 DIAGNOSIS — Z08 Encounter for follow-up examination after completed treatment for malignant neoplasm: Secondary | ICD-10-CM | POA: Diagnosis not present

## 2022-05-15 DIAGNOSIS — K31819 Angiodysplasia of stomach and duodenum without bleeding: Secondary | ICD-10-CM

## 2022-05-15 DIAGNOSIS — D508 Other iron deficiency anemias: Secondary | ICD-10-CM

## 2022-05-15 LAB — CMP (CANCER CENTER ONLY)
ALT: 11 U/L (ref 0–44)
AST: 17 U/L (ref 15–41)
Albumin: 3.9 g/dL (ref 3.5–5.0)
Alkaline Phosphatase: 57 U/L (ref 38–126)
Anion gap: 5 (ref 5–15)
BUN: 15 mg/dL (ref 8–23)
CO2: 31 mmol/L (ref 22–32)
Calcium: 9.2 mg/dL (ref 8.9–10.3)
Chloride: 105 mmol/L (ref 98–111)
Creatinine: 0.74 mg/dL (ref 0.44–1.00)
GFR, Estimated: >60 mL/min (ref >60)
Glucose, Bld: 92 mg/dL (ref 70–99)
Potassium: 3.9 mmol/L (ref 3.5–5.1)
Sodium: 141 mmol/L (ref 135–145)
Total Bilirubin: 0.6 mg/dL (ref 0.3–1.2)
Total Protein: 6.9 g/dL (ref 6.5–8.1)

## 2022-05-15 LAB — IRON AND IRON BINDING CAPACITY (CC-WL,HP ONLY)
Iron: 29 ug/dL (ref 28–170)
Saturation Ratios: 7 % — ABNORMAL LOW (ref 10.4–31.8)
TIBC: 419 ug/dL (ref 250–450)
UIBC: 390 ug/dL (ref 148–442)

## 2022-05-15 LAB — CBC WITH DIFFERENTIAL (CANCER CENTER ONLY)
Abs Immature Granulocytes: 0.02 10*3/uL (ref 0.00–0.07)
Basophils Absolute: 0 10*3/uL (ref 0.0–0.1)
Basophils Relative: 0 %
Eosinophils Absolute: 0.2 10*3/uL (ref 0.0–0.5)
Eosinophils Relative: 4 %
HCT: 24.9 % — ABNORMAL LOW (ref 36.0–46.0)
Hemoglobin: 7.6 g/dL — ABNORMAL LOW (ref 12.0–15.0)
Immature Granulocytes: 0 %
Lymphocytes Relative: 16 %
Lymphs Abs: 0.8 10*3/uL (ref 0.7–4.0)
MCH: 25.2 pg — ABNORMAL LOW (ref 26.0–34.0)
MCHC: 30.5 g/dL (ref 30.0–36.0)
MCV: 82.7 fL (ref 80.0–100.0)
Monocytes Absolute: 0.6 10*3/uL (ref 0.1–1.0)
Monocytes Relative: 12 %
Neutro Abs: 3.4 10*3/uL (ref 1.7–7.7)
Neutrophils Relative %: 68 %
Platelet Count: 277 10*3/uL (ref 150–400)
RBC: 3.01 MIL/uL — ABNORMAL LOW (ref 3.87–5.11)
RDW: 19.8 % — ABNORMAL HIGH (ref 11.5–15.5)
WBC Count: 5.1 10*3/uL (ref 4.0–10.5)
nRBC: 0 % (ref 0.0–0.2)

## 2022-05-15 LAB — SAMPLE TO BLOOD BANK

## 2022-05-15 LAB — RETIC PANEL
Immature Retic Fract: 27.5 % — ABNORMAL HIGH (ref 2.3–15.9)
RBC.: 3.07 MIL/uL — ABNORMAL LOW (ref 3.87–5.11)
Retic Count, Absolute: 92.7 10*3/uL (ref 19.0–186.0)
Retic Ct Pct: 3 % (ref 0.4–3.1)
Reticulocyte Hemoglobin: 26.6 pg — ABNORMAL LOW (ref >27.9)

## 2022-05-15 LAB — FERRITIN: Ferritin: 104 ng/mL (ref 11–307)

## 2022-05-15 LAB — PREPARE RBC (CROSSMATCH)

## 2022-05-16 ENCOUNTER — Inpatient Hospital Stay: Payer: Medicare Other

## 2022-05-16 ENCOUNTER — Other Ambulatory Visit: Payer: Self-pay | Admitting: Adult Health

## 2022-05-16 VITALS — BP 142/69 | HR 60 | Temp 98.1°F | Resp 17

## 2022-05-16 DIAGNOSIS — D509 Iron deficiency anemia, unspecified: Secondary | ICD-10-CM

## 2022-05-16 DIAGNOSIS — D649 Anemia, unspecified: Secondary | ICD-10-CM

## 2022-05-16 DIAGNOSIS — Z08 Encounter for follow-up examination after completed treatment for malignant neoplasm: Secondary | ICD-10-CM | POA: Diagnosis not present

## 2022-05-16 DIAGNOSIS — D508 Other iron deficiency anemias: Secondary | ICD-10-CM

## 2022-05-16 MED ORDER — SODIUM CHLORIDE 0.9 % IV SOLN
400.0000 mg | Freq: Once | INTRAVENOUS | Status: AC
  Administered 2022-05-16: 400 mg via INTRAVENOUS
  Filled 2022-05-16: qty 20

## 2022-05-16 MED ORDER — SODIUM CHLORIDE 0.9% IV SOLUTION
250.0000 mL | Freq: Once | INTRAVENOUS | Status: AC
  Administered 2022-05-16: 250 mL via INTRAVENOUS

## 2022-05-16 MED ORDER — ACETAMINOPHEN 325 MG PO TABS
650.0000 mg | ORAL_TABLET | Freq: Once | ORAL | Status: AC
  Administered 2022-05-16: 650 mg via ORAL
  Filled 2022-05-16: qty 2

## 2022-05-16 MED ORDER — DIPHENHYDRAMINE HCL 25 MG PO CAPS
25.0000 mg | ORAL_CAPSULE | Freq: Once | ORAL | Status: AC
  Administered 2022-05-16: 25 mg via ORAL
  Filled 2022-05-16: qty 1

## 2022-05-16 MED ORDER — SODIUM CHLORIDE 0.9 % IV SOLN: Freq: Once | INTRAVENOUS | Status: AC

## 2022-05-17 LAB — TYPE AND SCREEN
ABO/RH(D): A POS
Antibody Screen: NEGATIVE
Unit division: 0

## 2022-05-17 LAB — BPAM RBC
Blood Product Expiration Date: 202401182359
ISSUE DATE / TIME: 202312281435
Unit Type and Rh: 6200

## 2022-05-23 ENCOUNTER — Telehealth: Payer: Self-pay | Admitting: *Deleted

## 2022-05-23 NOTE — Telephone Encounter (Signed)
Pt called requesting to cancel iron infusion for Saturday due to the upcoming weather and not having a sitter for her husband. Advised pt that someone from scheduling will be calling to reschedule. Pt verbalized understanding and scheduling message has been sent.

## 2022-05-24 ENCOUNTER — Other Ambulatory Visit: Payer: Self-pay | Admitting: Adult Health

## 2022-05-24 MED FILL — Iron Sucrose Inj 20 MG/ML (Fe Equiv): INTRAVENOUS | Qty: 20 | Status: AC

## 2022-05-25 ENCOUNTER — Inpatient Hospital Stay: Payer: Medicare Other

## 2022-05-27 ENCOUNTER — Other Ambulatory Visit: Payer: Self-pay | Admitting: *Deleted

## 2022-05-27 DIAGNOSIS — D500 Iron deficiency anemia secondary to blood loss (chronic): Secondary | ICD-10-CM

## 2022-05-28 ENCOUNTER — Other Ambulatory Visit: Payer: Self-pay | Admitting: Adult Health

## 2022-05-28 ENCOUNTER — Other Ambulatory Visit: Payer: Self-pay

## 2022-05-28 ENCOUNTER — Inpatient Hospital Stay: Payer: Medicare Other

## 2022-05-28 ENCOUNTER — Inpatient Hospital Stay: Payer: Medicare Other | Attending: Hematology and Oncology

## 2022-05-28 VITALS — BP 167/79 | HR 67 | Temp 98.6°F | Resp 17

## 2022-05-28 DIAGNOSIS — D509 Iron deficiency anemia, unspecified: Secondary | ICD-10-CM | POA: Insufficient documentation

## 2022-05-28 DIAGNOSIS — K31819 Angiodysplasia of stomach and duodenum without bleeding: Secondary | ICD-10-CM

## 2022-05-28 DIAGNOSIS — D500 Iron deficiency anemia secondary to blood loss (chronic): Secondary | ICD-10-CM

## 2022-05-28 DIAGNOSIS — D508 Other iron deficiency anemias: Secondary | ICD-10-CM

## 2022-05-28 DIAGNOSIS — Z853 Personal history of malignant neoplasm of breast: Secondary | ICD-10-CM | POA: Insufficient documentation

## 2022-05-28 LAB — CMP (CANCER CENTER ONLY)
ALT: 10 U/L (ref 0–44)
AST: 17 U/L (ref 15–41)
Albumin: 4.1 g/dL (ref 3.5–5.0)
Alkaline Phosphatase: 56 U/L (ref 38–126)
Anion gap: 4 — ABNORMAL LOW (ref 5–15)
BUN: 19 mg/dL (ref 8–23)
CO2: 33 mmol/L — ABNORMAL HIGH (ref 22–32)
Calcium: 9.6 mg/dL (ref 8.9–10.3)
Chloride: 105 mmol/L (ref 98–111)
Creatinine: 0.66 mg/dL (ref 0.44–1.00)
GFR, Estimated: >60 mL/min (ref >60)
Glucose, Bld: 86 mg/dL (ref 70–99)
Potassium: 4 mmol/L (ref 3.5–5.1)
Sodium: 142 mmol/L (ref 135–145)
Total Bilirubin: 0.8 mg/dL (ref 0.3–1.2)
Total Protein: 7 g/dL (ref 6.5–8.1)

## 2022-05-28 LAB — IRON AND IRON BINDING CAPACITY (CC-WL,HP ONLY)
Iron: 556 ug/dL — ABNORMAL HIGH (ref 28–170)
Saturation Ratios: 154 % — ABNORMAL HIGH (ref 10.4–31.8)
TIBC: 361 ug/dL (ref 250–450)
UIBC: UNDETERMINED ug/dL (ref 148–442)

## 2022-05-28 LAB — CBC WITH DIFFERENTIAL (CANCER CENTER ONLY)
Abs Immature Granulocytes: 0.01 10*3/uL (ref 0.00–0.07)
Basophils Absolute: 0 10*3/uL (ref 0.0–0.1)
Basophils Relative: 0 %
Eosinophils Absolute: 0.2 10*3/uL (ref 0.0–0.5)
Eosinophils Relative: 4 %
HCT: 31.8 % — ABNORMAL LOW (ref 36.0–46.0)
Hemoglobin: 9.8 g/dL — ABNORMAL LOW (ref 12.0–15.0)
Immature Granulocytes: 0 %
Lymphocytes Relative: 14 %
Lymphs Abs: 0.8 10*3/uL (ref 0.7–4.0)
MCH: 26.6 pg (ref 26.0–34.0)
MCHC: 30.8 g/dL (ref 30.0–36.0)
MCV: 86.4 fL (ref 80.0–100.0)
Monocytes Absolute: 0.5 10*3/uL (ref 0.1–1.0)
Monocytes Relative: 9 %
Neutro Abs: 3.9 10*3/uL (ref 1.7–7.7)
Neutrophils Relative %: 73 %
Platelet Count: 308 10*3/uL (ref 150–400)
RBC: 3.68 MIL/uL — ABNORMAL LOW (ref 3.87–5.11)
RDW: 21.9 % — ABNORMAL HIGH (ref 11.5–15.5)
WBC Count: 5.4 10*3/uL (ref 4.0–10.5)
nRBC: 0 % (ref 0.0–0.2)

## 2022-05-28 LAB — RETIC PANEL
Immature Retic Fract: 15.3 % (ref 2.3–15.9)
RBC.: 3.57 MIL/uL — ABNORMAL LOW (ref 3.87–5.11)
Retic Count, Absolute: 32.1 10*3/uL (ref 19.0–186.0)
Retic Ct Pct: 0.9 % (ref 0.4–3.1)
Reticulocyte Hemoglobin: 31.5 pg (ref >27.9)

## 2022-05-28 LAB — FERRITIN: Ferritin: 126 ng/mL (ref 11–307)

## 2022-05-28 MED ORDER — SODIUM CHLORIDE 0.9 % IV SOLN: Freq: Once | INTRAVENOUS | Status: AC

## 2022-05-28 MED ORDER — SODIUM CHLORIDE 0.9 % IV SOLN
200.0000 mg | Freq: Once | INTRAVENOUS | Status: AC
  Administered 2022-05-28: 200 mg via INTRAVENOUS
  Filled 2022-05-28: qty 200

## 2022-05-28 NOTE — Patient Instructions (Signed)

## 2022-05-28 NOTE — Progress Notes (Signed)
Venofer dose clarified with Mendel Ryder, NP.  Pt to receive '200mg'$  IV today for final/total dosing of '1000mg'$ .  Relayed dosing/appointment change to patient and she stated understanding.  Pt declined to stay for post observation.  D/C in stable condition to lobby for lab appt

## 2022-06-08 ENCOUNTER — Other Ambulatory Visit: Payer: Self-pay | Admitting: Internal Medicine

## 2022-06-18 ENCOUNTER — Inpatient Hospital Stay: Payer: Medicare Other

## 2022-06-18 ENCOUNTER — Other Ambulatory Visit: Payer: Self-pay

## 2022-06-18 ENCOUNTER — Inpatient Hospital Stay: Payer: Medicare Other | Admitting: Hematology and Oncology

## 2022-06-18 VITALS — BP 173/90 | HR 66 | Temp 97.9°F | Resp 16 | Ht 63.0 in | Wt 114.7 lb

## 2022-06-18 DIAGNOSIS — Z853 Personal history of malignant neoplasm of breast: Secondary | ICD-10-CM | POA: Diagnosis not present

## 2022-06-18 DIAGNOSIS — D508 Other iron deficiency anemias: Secondary | ICD-10-CM

## 2022-06-18 DIAGNOSIS — K31819 Angiodysplasia of stomach and duodenum without bleeding: Secondary | ICD-10-CM

## 2022-06-18 DIAGNOSIS — D509 Iron deficiency anemia, unspecified: Secondary | ICD-10-CM

## 2022-06-18 LAB — IRON AND IRON BINDING CAPACITY (CC-WL,HP ONLY)
Iron: 47 ug/dL (ref 28–170)
Saturation Ratios: 13 % (ref 10.4–31.8)
TIBC: 358 ug/dL (ref 250–450)
UIBC: 311 ug/dL (ref 148–442)

## 2022-06-18 LAB — CMP (CANCER CENTER ONLY)
ALT: 10 U/L (ref 0–44)
AST: 18 U/L (ref 15–41)
Albumin: 4.1 g/dL (ref 3.5–5.0)
Alkaline Phosphatase: 58 U/L (ref 38–126)
Anion gap: 6 (ref 5–15)
BUN: 20 mg/dL (ref 8–23)
CO2: 30 mmol/L (ref 22–32)
Calcium: 9.6 mg/dL (ref 8.9–10.3)
Chloride: 106 mmol/L (ref 98–111)
Creatinine: 0.75 mg/dL (ref 0.44–1.00)
GFR, Estimated: >60 mL/min (ref >60)
Glucose, Bld: 82 mg/dL (ref 70–99)
Potassium: 3.6 mmol/L (ref 3.5–5.1)
Sodium: 142 mmol/L (ref 135–145)
Total Bilirubin: 0.6 mg/dL (ref 0.3–1.2)
Total Protein: 7.2 g/dL (ref 6.5–8.1)

## 2022-06-18 LAB — RETIC PANEL
Immature Retic Fract: 14.8 % (ref 2.3–15.9)
RBC.: 3.59 MIL/uL — ABNORMAL LOW (ref 3.87–5.11)
Retic Count, Absolute: 39.8 10*3/uL (ref 19.0–186.0)
Retic Ct Pct: 1.1 % (ref 0.4–3.1)
Reticulocyte Hemoglobin: 30.6 pg (ref >27.9)

## 2022-06-18 LAB — CBC WITH DIFFERENTIAL (CANCER CENTER ONLY)
Abs Immature Granulocytes: 0.01 10*3/uL (ref 0.00–0.07)
Basophils Absolute: 0 10*3/uL (ref 0.0–0.1)
Basophils Relative: 0 %
Eosinophils Absolute: 0.3 10*3/uL (ref 0.0–0.5)
Eosinophils Relative: 5 %
HCT: 31.8 % — ABNORMAL LOW (ref 36.0–46.0)
Hemoglobin: 9.9 g/dL — ABNORMAL LOW (ref 12.0–15.0)
Immature Granulocytes: 0 %
Lymphocytes Relative: 14 %
Lymphs Abs: 0.7 10*3/uL (ref 0.7–4.0)
MCH: 27.3 pg (ref 26.0–34.0)
MCHC: 31.1 g/dL (ref 30.0–36.0)
MCV: 87.8 fL (ref 80.0–100.0)
Monocytes Absolute: 0.5 10*3/uL (ref 0.1–1.0)
Monocytes Relative: 10 %
Neutro Abs: 3.3 10*3/uL (ref 1.7–7.7)
Neutrophils Relative %: 71 %
Platelet Count: 264 10*3/uL (ref 150–400)
RBC: 3.62 MIL/uL — ABNORMAL LOW (ref 3.87–5.11)
RDW: 21.4 % — ABNORMAL HIGH (ref 11.5–15.5)
WBC Count: 4.7 10*3/uL (ref 4.0–10.5)
nRBC: 0 % (ref 0.0–0.2)

## 2022-06-18 LAB — SAMPLE TO BLOOD BANK

## 2022-06-18 LAB — FERRITIN: Ferritin: 86 ng/mL (ref 11–307)

## 2022-06-18 NOTE — Progress Notes (Signed)
[facility] Follow up:    Kristi Ghent, MD [place] [address]   DIAGNOSIS:  Cancer Staging  History of left breast cancer Staging form: Breast, AJCC 7th Edition - Clinical: Stage IIA (T2, N0, cM0) - Unsigned Specimen type: Core Needle Biopsy Histopathologic type: 9931 Laterality: Left Staging comments: Staged at breast conference 08/11/13.  - Pathologic: No stage assigned - Unsigned Specimen type: Core Needle Biopsy Histopathologic type: 9931 Laterality: Left   SUMMARY OF HEMATOLOGIC/ONCOLOGIC HISTORY: [place] woman status post left breast biopsy 08/02/2013 for a clinical T2 N0, stage IIA invasive papillary breast cancer, grade 1, estrogen receptor and progesterone receptor both 100% positive, with an MIB-1 of 9% and no HER-2 amplification   (1) Status post left lumpectomy and sentinel lymph node sampling 09/06/2013 for a pT1c pN0, stage IA invasive ductal carcinoma, grade 1, repeat HER-2 again negative   (2) Adjuvant radiation completed 11/30/2013   (3) started tamoxifen 12/18/2013, completing 5 years April 2020   (4)  anemia: as of 01/09/2018, ferritin was 16.2,, iron saturation, B12 and folate are normal as is the reticulocyte count and creatinine.              (a) REVIEW OF BLOOD FILM 01/09/2018 shows mild rouleaux, but no other abnormalities: Specifically there are no tailed poikilocytes or significant anisocytosis, no left shift in the white cell series, and no artifactual platelet counts             (b) ferritin on 07/26/2019 was 3.5 (severe deficiency)             (c) iron sucrose started 08/18/2019, to be repeated x5, received 12/14/2019-01/11/2020, 06/14/2020-08/04/2020, 02/24/2021-03/12/2021             (d) GI workup:                        () colonoscopy 04/05/2019: diverticulosis   () Diagnosed in 2021 with Gastric hemorrhage due to gastric antral vascular ectasia--sees Dr. Newman Pies for radiofrequency ablation of the  areas in question (last performed 11/2021)  CURRENT THERAPY: observation for h/o left breast cancer; intermittent IV iron for iron deficiency anemia  INTERVAL HISTORY:  Kristi Carr 77 y.o. female returns for follow-up of her history of left-sided breast cancer and iron deficiency anemia.  Her most recent mammogram occurred on July 30, 2021 demonstrating no mammographic evidence of malignancy and breast density category C.  In regard to her iron deficiency she underwent lab work with her primary care provider on December 15 that demonstrated worsening in her hemoglobin from 10.3 in October to 8.1 on December 15.  Her ferritin has also declined from 201 on April 3 down to 3.2 on December 15.  She was last seen by lindsey and received IV iron. She has ablation planned tomorrow for GAVE. She feels well. She has mammogram coming up in April. She otherwise feels well and is curious to if she can take some vitamin K2 and the supplements for her bone health.  No changes reported in the breast.  Rest of the pertinent 10 point ROS reviewed and negative  Patient Active Problem List   Diagnosis Date Noted   Connective tissue disease ([place]) 05/07/2022   Osteoarthritis 05/07/2022   Raynaud's disease 05/07/2022   Gastric antral vascular ectasia 23/53/6144   Lichen sclerosus of female genitalia 12/03/2019   Female bladder prolapse 12/03/2019   Osteopenia 12/03/2019   Diverticulosis 04/04/2019  Internal hemorrhoids 04/04/2019   Status post cholecystectomy 12/22/2018   History of adenomatous polyp of colon 09/16/2017   Sensorineural hearing loss (SNHL), bilateral 09/10/2017   IDA (iron deficiency anemia) 07/31/2017   Murmur 07/06/2017   Recurrent Clostridium difficile diarrhea 03/11/2017   Healthcare maintenance 12/15/2016   ETD (eustachian tube dysfunction) 05/22/2016   Vaginal pessary in situ 02/14/2016   Advance care planning 05/23/2015   History of left breast cancer 08/11/2013   Diarrhea  11/04/2012   Medicare annual wellness visit, subsequent 11/04/2012   Hypercholesteremia 02/20/2011   HTN (hypertension) 12/19/2010   Vitamin D deficiency 06/22/2008   RENAL CALCULUS 08/19/2003    is allergic to clindamycin/lincomycin.  MEDICAL HISTORY: Past Medical History:  Diagnosis Date   Anemia    Breast cancer of upper-outer quadrant of left female breast ([place])    C. difficile diarrhea    Colon polyps    Gallstones 08/23/2003   x 2   Gastric hemorrhage due to gastric antral vascular ectasia (GAVE)    s/p radiofrequency ablation    GAVE (gastric antral vascular ectasia)    Hot flashes    Hypertension    Kidney stones    Radiation 10/14/13-12/02/13   left breast 50.4 gray, lumpectomy cavity boosted to 63 gray   Wears glasses     SURGICAL HISTORY: Past Surgical History:  Procedure Laterality Date   BREAST LUMPECTOMY WITH SENTINEL LYMPH NODE BIOPSY Left 09/06/2013   LEFT BREAST SEED GUIDED LUMPECTOMY WITH LEFT AXILLARY SENTINEL NODE BIOPSY (Left)   CHOLECYSTECTOMY     COLONOSCOPY     PARTIAL HYSTERECTOMY  05/20/1978   ovaries left in   TONSILLECTOMY     TUBAL LIGATION     WISDOM TOOTH EXTRACTION      SOCIAL HISTORY: Social History   Socioeconomic History   Marital status: Married    Spouse name: Not on file   Number of children: 2   Years of education: Not on file   Highest education level: Not on file  Occupational History   Occupation: Retired from [employer] in 2008  Tobacco Use   Smoking status: Never   Smokeless tobacco: Never  Vaping Use   Vaping Use: Never used  Substance and Sexual Activity   Alcohol use: No   Drug use: No   Sexual activity: Yes  Other Topics Concern   Not on file  Social History Narrative   Married 1966   2 kids   4 grand children   Retired from [employer]   Social Determinants of Health   Financial Resource Strain: Low Risk  (12/27/2021)   Overall Financial Resource Strain (CARDIA)    Difficulty of Paying Living  Expenses: Not hard at all  Food Insecurity: No Food Insecurity (12/27/2021)   Hunger Vital Sign    Worried About Estate manager/land agent of Food in the Last Year: Never true    Ran Out of Food in the Last Year: Never true  Transportation Needs: No Transportation Needs (12/27/2021)   PRAPARE - Hydrologist (Medical): No    Lack of Transportation (Non-Medical): No  Physical Activity: Insufficiently Active (12/27/2021)   Exercise Vital Sign    Days of Exercise per Week: 3 days    Minutes of Exercise per Session: 30 min  Stress: No Stress Concern Present (12/27/2021)   [place]    Feeling of Stress : Only a little  Social Connections: Moderately Isolated (12/27/2021)  Social Licensed conveyancer [NHANES]    Frequency of Communication with Friends and Family: More than three times a week    Frequency of Social Gatherings with Friends and Family: Once a week    Attends Religious Services: Never    Marine scientist or Organizations: No    Attends Archivist Meetings: Never    Marital Status: Married  Human resources officer Violence: Not At Risk (12/27/2021)   Humiliation, Afraid, Rape, and Kick questionnaire    Fear of Current or Ex-Partner: No    Emotionally Abused: No    Physically Abused: No    Sexually Abused: No    FAMILY HISTORY: Family History  Problem Relation Age of Onset   Diabetes Mother    Hyperlipidemia Mother    Hypertension Mother    Breast cancer Mother    Heart attack Father    Heart disease Father    Hypertension Sister    Diabetes Sister    Heart disease Brother        CHF  EF 10%   Hypertension Brother    Gout Brother    Depression Neg Hx    Alcohol abuse Neg Hx    Drug abuse Neg Hx    Colon cancer Neg Hx    Uterine cancer Neg Hx    Ovarian cancer Neg Hx    Prostate cancer Neg Hx     Review of Systems  Constitutional:  Negative for appetite change,  chills, fatigue, fever and unexpected weight change.  HENT:   Negative for hearing loss, lump/mass and trouble swallowing.   Eyes:  Negative for eye problems and icterus.  Respiratory:  Negative for chest tightness, cough and shortness of breath.   Cardiovascular:  Negative for chest pain, leg swelling and palpitations.  Gastrointestinal:  Negative for abdominal distention, abdominal pain, constipation, diarrhea, nausea and vomiting.  Endocrine: Negative for hot flashes.  Genitourinary:  Negative for difficulty urinating.   Musculoskeletal:  Negative for arthralgias.  Skin:  Negative for itching and rash.  Neurological:  Negative for dizziness, extremity weakness, headaches and numbness.  Hematological:  Negative for adenopathy. Does not bruise/bleed easily.  Psychiatric/Behavioral:  Negative for depression. The patient is not nervous/anxious.       PHYSICAL EXAMINATION  ECOG PERFORMANCE STATUS: 0 - Asymptomatic  Vitals:   06/18/22 1309  BP: (!) 173/90  Pulse: 66  Resp: 16  Temp: 97.9 F (36.6 C)  SpO2: 100%  BP manually 182/84  Physical Exam Constitutional:      General: She is not in acute distress.    Appearance: Normal appearance. She is not toxic-appearing.  HENT:     Head: Normocephalic and atraumatic.  Eyes:     General: No scleral icterus. Cardiovascular:     Rate and Rhythm: Normal rate and regular rhythm.     Pulses: Normal pulses.     Heart sounds: Normal heart sounds.  Pulmonary:     Effort: Pulmonary effort is normal.     Breath sounds: Normal breath sounds.  Chest:     Comments: Bilateral breasts inspected and palpated.  Left breast with postradiation changes.  No new palpable masses or regional adenopathy. Abdominal:     General: Abdomen is flat. Bowel sounds are normal. There is no distension.     Palpations: Abdomen is soft.     Tenderness: There is no abdominal tenderness.  Musculoskeletal:        General: No swelling.     Cervical back:  Neck  supple.  Lymphadenopathy:     Cervical: No cervical adenopathy.  Skin:    General: Skin is warm and dry.     Findings: No rash.  Neurological:     General: No focal deficit present.     Mental Status: She is alert.  Psychiatric:        Mood and Affect: Mood normal.        Behavior: Behavior normal.     LABORATORY DATA:  CBC    Component Value Date/Time   WBC 4.7 06/18/2022 1238   WBC 3.3 (L) 05/03/2022 0858   RBC 3.59 (L) 06/18/2022 1239   RBC 3.62 (L) 06/18/2022 1238   HGB 9.9 (L) 06/18/2022 1238   HGB 10.0 (L) 08/01/2016 1016   HCT 31.8 (L) 06/18/2022 1238   HCT 30.1 (L) 08/01/2016 1016   PLT 264 06/18/2022 1238   PLT 239 08/01/2016 1016   MCV 87.8 06/18/2022 1238   MCV 90.2 08/01/2016 1016   MCH 27.3 06/18/2022 1238   MCHC 31.1 06/18/2022 1238   RDW 21.4 (H) 06/18/2022 1238   RDW 13.5 08/01/2016 1016   LYMPHSABS 0.7 06/18/2022 1238   LYMPHSABS 0.9 08/01/2016 1016   MONOABS 0.5 06/18/2022 1238   MONOABS 0.5 08/01/2016 1016   EOSABS 0.3 06/18/2022 1238   EOSABS 0.1 08/01/2016 1016   BASOSABS 0.0 06/18/2022 1238   BASOSABS 0.0 08/01/2016 1016     ASSESSMENT and THERAPY PLAN:   This is a very pleasant 77 year old female patient with history of left breast cancer status postlumpectomy radiation and antiestrogen therapy, also has iron deficiency from recurrent GI bleed secondary to [place] who is here for follow-up.  Since her last visit here she received IV iron and feels remarkably better.  IDA (iron deficiency anemia) Mora is a 77 year old with anemia indicating iron deficiency that is related to a history of diverticulitis and GAVE (followed at [facility] in [place]).   Labs from today show stable hemoglobin compared to last visit. She received IV iron and feels better.  She also has ablation planned for tomorrow.  She will call if she feels fatigued otherwise she will return to clinic in about a year. We have reviewed labs today which showed a  hemoglobin of 9.9, iron panel and ferritin pending   History of left breast cancer She has a history of left breast cancer status postlumpectomy, radiation, 5 years of adjuvant tamoxifen.  Her most recent mammogram was negative for malignancy.  She continues with annual mammograms and has no clinical or radiographic sign of breast cancer recurrence.  Physical examination today unremarkable except for left breast postradiation changes.  She was encouraged to continue self breast exam monthly and report any new concerns to [place] and to continue annual mammograms. She is already scheduled for mammogram in 2024.  All questions were answered. The patient knows to call the clinic with any problems, questions or concerns. We can certainly see the patient much sooner if necessary.  Total encounter time:30 minutes*in face-to-face visit time, chart review, lab review, care coordination, order entry, and documentation of the encounter time. *Total Encounter Time as defined by the Centers for Medicare and Medicaid Services includes, in addition to the face-to-face time of a patient visit (documented in the note above) non-face-to-face time: obtaining and reviewing outside history, ordering and reviewing medications, tests or procedures, care coordination (communications with other health care professionals or caregivers) and documentation in the medical record.  Benay Pike MD

## 2022-06-24 ENCOUNTER — Telehealth: Payer: Self-pay | Admitting: Internal Medicine

## 2022-06-24 NOTE — Telephone Encounter (Signed)
Patient is requesting a refill on Omeprazole.

## 2022-06-24 NOTE — Telephone Encounter (Signed)
I was unable to reach Kristi Carr on either of her numbers. Looks like her PCP refilled her omeprazole last time. Also looks like she is going to [facility] and recently had a procedure there.

## 2022-06-25 ENCOUNTER — Ambulatory Visit (INDEPENDENT_AMBULATORY_CARE_PROVIDER_SITE_OTHER): Payer: Medicare Other | Admitting: Family Medicine

## 2022-06-25 ENCOUNTER — Encounter: Payer: Self-pay | Admitting: Family Medicine

## 2022-06-25 VITALS — BP 160/90 | HR 61 | Temp 97.9°F | Ht 63.0 in | Wt 113.0 lb

## 2022-06-25 DIAGNOSIS — I10 Essential (primary) hypertension: Secondary | ICD-10-CM

## 2022-06-25 MED ORDER — AMLODIPINE BESYLATE 2.5 MG PO TABS: 2.5000 mg | ORAL_TABLET | Freq: Every day | ORAL | 1 refills | Status: DC

## 2022-06-25 NOTE — Telephone Encounter (Signed)
Patient has switched GI care to [facility]. Patient can contact that office for future refills.

## 2022-06-25 NOTE — Patient Instructions (Signed)
Keep taking 1 metoprolol a day.   If BP is above 140/90, then add on 2.'5mg'$  amlodipine.  If BP is still above 140/90 after 1 week, then increase to '5mg'$  amlodipine.   If lightheaded or BP is below 130/80, then cut back on amlodipine dose.   Take care.  Glad to see you.

## 2022-06-25 NOTE — Progress Notes (Unsigned)
Hypertension:    Using medication without problems or lightheadedness: yes Chest pain with exertion:no Edema:no Short of breath:no She had recent ablation done at [facility].   Caring for her husband who has memory loss.   She had taken extra half tab of metoprolol recently and BP improved some.    Meds, vitals, and allergies reviewed.   ROS: Per HPI unless specifically indicated in ROS section   GEN: nad, alert and oriented HEENT: ncat NECK: supple w/o LA CV: rrr. PULM: ctab, no inc wob ABD: soft, +bs EXT: no edema SKIN: no acute rash

## 2022-06-26 ENCOUNTER — Other Ambulatory Visit: Payer: Self-pay

## 2022-06-26 MED ORDER — OMEPRAZOLE 40 MG PO CPDR: 40.0000 mg | DELAYED_RELEASE_CAPSULE | Freq: Every day | ORAL | 2 refills | Status: DC

## 2022-06-26 NOTE — Telephone Encounter (Signed)
Spoke with the patient. Reviewed her records. The patient's care was accepted by Dr Lorenso Courier. She follows with [facility] for routine EGDs to get her GAVE treated.  Patient last seen 11/2021 and was lost to follow up. Scheduled the patient for her follow up appointment. Refilled the Omeprazole to cover until she is seen.

## 2022-06-26 NOTE — Assessment & Plan Note (Signed)
Discussed options. Keep taking 1 metoprolol a day.   If BP is above 140/90, then add on 2.'5mg'$  amlodipine.  If BP is still above 140/90 after 1 week, then increase to '5mg'$  amlodipine.   If lightheaded or BP is below 130/80, then cut back on amlodipine dose.   Update me as needed.

## 2022-08-02 ENCOUNTER — Other Ambulatory Visit: Payer: Self-pay | Admitting: Family Medicine

## 2022-08-11 ENCOUNTER — Telehealth: Payer: Self-pay | Admitting: Family Medicine

## 2022-08-11 DIAGNOSIS — R911 Solitary pulmonary nodule: Secondary | ICD-10-CM

## 2022-08-11 NOTE — Telephone Encounter (Signed)
Please call pt.  Due for 1 year f/u CT.  I put in the order.  She should get a call about scheduling.  Thanks.

## 2022-08-12 ENCOUNTER — Encounter: Payer: Self-pay | Admitting: *Deleted

## 2022-08-12 NOTE — Telephone Encounter (Signed)
Called patient about f/u CT scan and she is fine with that. She will await call to schedule.

## 2022-08-22 LAB — HM MAMMOGRAPHY

## 2022-08-27 ENCOUNTER — Other Ambulatory Visit (INDEPENDENT_AMBULATORY_CARE_PROVIDER_SITE_OTHER): Payer: Medicare Other

## 2022-08-27 ENCOUNTER — Ambulatory Visit: Payer: Medicare Other | Admitting: Internal Medicine

## 2022-08-27 ENCOUNTER — Encounter: Payer: Self-pay | Admitting: Internal Medicine

## 2022-08-27 VITALS — BP 146/70 | HR 72 | Ht 63.0 in | Wt 114.5 lb

## 2022-08-27 DIAGNOSIS — K31819 Angiodysplasia of stomach and duodenum without bleeding: Secondary | ICD-10-CM | POA: Diagnosis not present

## 2022-08-27 DIAGNOSIS — R197 Diarrhea, unspecified: Secondary | ICD-10-CM

## 2022-08-27 DIAGNOSIS — R1030 Lower abdominal pain, unspecified: Secondary | ICD-10-CM | POA: Diagnosis not present

## 2022-08-27 DIAGNOSIS — K589 Irritable bowel syndrome without diarrhea: Secondary | ICD-10-CM

## 2022-08-27 DIAGNOSIS — Z9049 Acquired absence of other specified parts of digestive tract: Secondary | ICD-10-CM

## 2022-08-27 DIAGNOSIS — D649 Anemia, unspecified: Secondary | ICD-10-CM

## 2022-08-27 LAB — LIPASE: Lipase: 21 U/L (ref 11.0–59.0)

## 2022-08-27 LAB — HIGH SENSITIVITY CRP: CRP, High Sensitivity: 6.71 mg/L — ABNORMAL HIGH (ref 0.000–5.000)

## 2022-08-27 MED ORDER — DIPHENOXYLATE-ATROPINE 2.5-0.025 MG PO TABS: 1.0000 | ORAL_TABLET | Freq: Four times a day (QID) | ORAL | 1 refills | Status: DC | PRN

## 2022-08-27 MED ORDER — DICYCLOMINE HCL 10 MG PO CAPS: 10.0000 mg | ORAL_CAPSULE | ORAL | 1 refills | Status: DC | PRN

## 2022-08-27 NOTE — Patient Instructions (Addendum)
Your provider has requested that you go to the basement level for lab work before leaving today. Press "B" on the elevator. The lab is located at the first door on the left as you exit the elevator.   We have sent the following medications to your pharmacy for you to pick up at your convenience: Bentyl, Lomotil  You are schedule for follow up visit on 11/27/22 at 2:10 pm  If your blood pressure at your visit was 140/90 or greater, please contact your primary care physician to follow up on this.  _______________________________________________________  If you are age 84 or older, your body mass index should be between 23-30. Your Body mass index is 20.28 kg/m. If this is out of the aforementioned range listed, please consider follow up with your Primary Care Provider.  If you are age 52 or younger, your body mass index should be between 19-25. Your Body mass index is 20.28 kg/m. If this is out of the aformentioned range listed, please consider follow up with your Primary Care Provider.   ________________________________________________________  The [facility] providers would like to encourage you to use [facility] to communicate with providers for non-urgent requests or questions.  Due to long hold times on the telephone, sending your provider a message by [facility] may be a faster and more efficient way to get a response.  Please allow 48 business hours for a response.  Please remember that this is for non-urgent requests.   Due to recent changes in healthcare laws, you may see the results of your imaging and laboratory studies on MyChart before your provider has had a chance to review them.  We understand that in some cases there may be results that are confusing or concerning to you. Not all laboratory results come back in the same time frame and the provider may be waiting for multiple results in order to interpret others.  Please give [place] 48 hours in order for your provider to thoroughly review all  the results before contacting the office for clarification of your results.    Thank you for entrusting me with your care and for choosing [facility], Dr. Eulah Pont

## 2022-08-27 NOTE — Progress Notes (Addendum)
Chief Complaint: Anemia  HPI : 77 year old female with history of GAVE, breast cancer s/p lumpectomy and radiation, osteoporosis, prior C dif infection presents with anemia  Interval History: This morning she ate a roll with cheddar cheese and then developed abdominal pain afterwards. The pain usually comes on after she eats. The pain is in the mid-abdomen. She feels like she has gas. She has about 3-4 BMs usually after she eats. The diarrhea is mostly in the morning. After she has her BMs, this takes the abdominal pain away. The Bentyl is helping with her pain. She didn't start the colestipol medication because she was worried about the side effects. She doesn't think that the omeprazole has been helping with the pain at all. Weight has been stable. She does recognize that she is under stress because she has to take care of her husband who has dementia. Her last EGD with RFA for GAVE was in 05/2022.  Wt Readings from Last 3 Encounters:  08/27/22 114 lb 8 oz (51.9 kg)  06/25/22 113 lb (51.3 kg)  06/18/22 114 lb 11.2 oz (52 kg)   Current Outpatient Medications  Medication Sig Dispense Refill   amLODipine (NORVASC) 2.5 MG tablet Take 1-2 tablets (2.5-5 mg total) by mouth daily. 180 tablet 1   dicyclomine (BENTYL) 10 MG capsule Take 10 mg by mouth as needed for spasms.     diphenoxylate-atropine (LOMOTIL) 2.5-0.025 MG tablet Take 1 tablet by mouth 4 (four) times daily as needed for diarrhea or loose stools. 30 tablet 1   metoprolol succinate (TOPROL-XL) 50 MG 24 hr tablet TAKE 1 TABLET BY MOUTH DAILY. TAKE WITH OR IMMEDIATELY FOLLOWING A MEAL. 90 tablet 3   moxifloxacin (VIGAMOX) 0.5 % ophthalmic solution Place 1 drop into the left eye 4 (four) times daily.     omeprazole (PRILOSEC) 40 MG capsule Take 1 capsule (40 mg total) by mouth daily. 30 capsule 2   prednisoLONE acetate (PRED FORTE) 1 % ophthalmic suspension Place 1 drop into the left eye 4 (four) times daily.     No current  facility-administered medications for this visit.   Physical Exam: BP (!) 140/70 (BP Location: Right Arm, Patient Position: Sitting, Cuff Size: Normal)   Pulse 72   Ht 5\' 3"  (1.6 m)   Wt 114 lb 8 oz (51.9 kg)   BMI 20.28 kg/m  Constitutional: Pleasant,well-developed, female in no acute distress. HEENT: Normocephalic and atraumatic. Conjunctivae are normal. No scleral icterus. Cardiovascular: Normal rate Pulmonary/chest: Effort normal Abdominal: Soft, nondistended, nontender. Bowel sounds active throughout. There are no masses palpable. No hepatomegaly. Extremities: No edema Neurological: Alert and oriented to person place and time. Skin: Skin is warm and dry. No rashes noted. Psychiatric: Normal mood and affect. Behavior is normal.  Labs 07/2021: CBC with low Hb of 11. BMP nml. FOBT positive.  Labs 08/2021: CBC with low Hb of 10.4. Ferritin 201.  Labs 10/2021: CBC with low Hb of 10.4.  Labs 05/2022: CBC with low Hb of 9.9. Ferritin 126. CMP unremarkable.  TTE 07/23/17: Study Conclusions  - Left ventricle: The cavity size was normal. There was mild focal    basal hypertrophy of the septum. Systolic function was normal.    The estimated ejection fraction was in the range of 60% to 65%.    Wall motion was normal; there were no regional wall motion    abnormalities. Doppler parameters are consistent with abnormal    left ventricular relaxation (grade 1 diastolic dysfunction).  -  Mitral valve: There was mild regurgitation.  - Left atrium: The atrium was normal in size.  - Right ventricle: Systolic function was normal.  - Pulmonary arteries: Systolic pressure was within the normal    range.   CTA GI Bleed 07/18/21: IMPRESSION: VASCULAR 1. Visceral arteries in the abdomen and pelvis are widely patent. No evidence for acute or chronic mesenteric ischemia. 2. 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 Recommend follow-up CTA in 6-12  months to ensure stability. 3.  Aortic Atherosclerosis (ICD10-I70.0). NON-VASCULAR 1. No evidence for active GI bleeding. 2. Extensive colonic diverticulosis without acute inflammation. 3. Perinephric edema or stranding along the posteroinferior aspect of both kidneys. Findings are nonspecific and age-indeterminate. Probable cyst in the right kidney upper pole. 4. Cholecystectomy. Mild intrahepatic biliary dilatation versus mild periportal edema. 5. Dependent changes in both lung bases, likely related atelectasis. 6. Indeterminate 4 mm nodule in the right middle lobe. No follow-up needed if patient is low-risk. Non-contrast chest CT can be considered in 12 months if patient is high-risk. This recommendation follows the consensus statement: Guidelines for Management of Incidental Pulmonary Nodules Detected on CT Images: From the Fleischner Society 2017; Radiology 2017; 284:228-243.  CTA Pelvis 03/15/22: IMPRESSION: Unchanged appearance of the 6 mm outpouching from the right internal iliac artery. Given that there is an artery origin from the dome, this might represent either an infundibulum or aneurysm Aortic Atherosclerosis (ICD10-I70.0). Additional ancillary findings as above.  Colonoscopy 01/27/14: Left sided diverticulosis. Internal hemorrhoids. Recommend repeat in 5 years.  Colonoscopy 04/05/19: Diverticulosis of the sigmoid colon. Internal hemorrhoids. Recommended repeat colonoscopy in 5 years due to personal history of adenoma  EGD 08/08/20: Esophagus and duodenum were normal. 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   ASSESSMENT AND PLAN: Diarrhea Midline abdominal pain IBS History of cholecystectomy Anemia History of GAVE Patient  presents for follow up of midline abdomen pain and diarrhea. At this time I do suspect that her symptoms may be due to IBS or diarrhea. Alternatively microscopic colitis is another possibility. Patient's response to Bentyl suggests that colon spasms are playing a role. Patient was hesitant to take colestipol but is now willing to give colestipol a try to see if this helps with her ab pain and diarrhea. PPI has not helped with her pain so I think having her stay on QD is reasonable. Will plan for a basic lab work up to look for alternative causes of ab pain and diarrhea. I have also asked her to try a low FODMAP diet to see if foods make a difference (she does not think that they do at this time)  - Low FODMAP diet - Check CRP, TTG IgA, IgA, lipase - Start colestipol 2 mg BID - Cont Bentyl PRN for ab pain. Refill. - Cont Lomotil PRN for diarrhea. Refill. - RTC 2-3 months. If patient is not responsive to colestipol, can consider colonoscopy in the future to rule out microscopic colitis  Kristi Pontlaire Marilynn Ekstein, MD  I spent 37 minutes of time, including in depth chart review, independent review of results as outlined above, communicating results with the patient directly, face-to-face time with the patient, coordinating care, and ordering studies and medications as appropriate, and documentation.

## 2022-08-28 LAB — TISSUE TRANSGLUTAMINASE, IGA: (tTG) Ab, IgA: <1 U/mL

## 2022-08-28 LAB — IGG: IgG (Immunoglobin G), Serum: 1050 mg/dL (ref 600–1540)

## 2022-08-29 ENCOUNTER — Ambulatory Visit
Admission: RE | Admit: 2022-08-29 | Discharge: 2022-08-29 | Disposition: A | Discharge status: Home / Self Care | Payer: Medicare Other | Source: Ambulatory Visit | Attending: Family Medicine | Admitting: Family Medicine

## 2022-08-29 DIAGNOSIS — R911 Solitary pulmonary nodule: Secondary | ICD-10-CM

## 2022-09-21 ENCOUNTER — Other Ambulatory Visit: Payer: Self-pay | Admitting: Internal Medicine

## 2022-10-28 ENCOUNTER — Telehealth: Payer: Self-pay

## 2022-10-28 ENCOUNTER — Telehealth: Payer: Self-pay | Admitting: Hematology and Oncology

## 2022-10-28 DIAGNOSIS — D509 Iron deficiency anemia, unspecified: Secondary | ICD-10-CM

## 2022-10-28 DIAGNOSIS — K31811 Angiodysplasia of stomach and duodenum with bleeding: Secondary | ICD-10-CM

## 2022-10-28 NOTE — Telephone Encounter (Signed)
Spoke with patient confirming upcoming appointment  

## 2022-10-28 NOTE — Telephone Encounter (Signed)
Pt called and reports she is experiencing weakness and fatigue. She asks to come in for labs to see if she needs an iron infusion. Per MD, labs ordered and appt made for pt to come in for labs 10/29/22.

## 2022-10-29 ENCOUNTER — Inpatient Hospital Stay: Payer: Medicare Other | Attending: Hematology and Oncology

## 2022-10-29 DIAGNOSIS — K31811 Angiodysplasia of stomach and duodenum with bleeding: Secondary | ICD-10-CM

## 2022-10-29 DIAGNOSIS — D509 Iron deficiency anemia, unspecified: Secondary | ICD-10-CM | POA: Insufficient documentation

## 2022-10-29 LAB — CBC WITH DIFFERENTIAL (CANCER CENTER ONLY)
Abs Immature Granulocytes: 0.01 10*3/uL (ref 0.00–0.07)
Basophils Absolute: 0 10*3/uL (ref 0.0–0.1)
Basophils Relative: 0 %
Eosinophils Absolute: 0.3 10*3/uL (ref 0.0–0.5)
Eosinophils Relative: 5 %
HCT: 27.1 % — ABNORMAL LOW (ref 36.0–46.0)
Hemoglobin: 8.4 g/dL — ABNORMAL LOW (ref 12.0–15.0)
Immature Granulocytes: 0 %
Lymphocytes Relative: 16 %
Lymphs Abs: 0.8 10*3/uL (ref 0.7–4.0)
MCH: 25.7 pg — ABNORMAL LOW (ref 26.0–34.0)
MCHC: 31 g/dL (ref 30.0–36.0)
MCV: 82.9 fL (ref 80.0–100.0)
Monocytes Absolute: 0.6 10*3/uL (ref 0.1–1.0)
Monocytes Relative: 12 %
Neutro Abs: 3.4 10*3/uL (ref 1.7–7.7)
Neutrophils Relative %: 67 %
Platelet Count: 262 10*3/uL (ref 150–400)
RBC: 3.27 MIL/uL — ABNORMAL LOW (ref 3.87–5.11)
RDW: 14.8 % (ref 11.5–15.5)
WBC Count: 5.1 10*3/uL (ref 4.0–10.5)
nRBC: 0 % (ref 0.0–0.2)

## 2022-10-29 LAB — RETIC PANEL
Immature Retic Fract: 14.7 % (ref 2.3–15.9)
RBC.: 3.33 MIL/uL — ABNORMAL LOW (ref 3.87–5.11)
Retic Count, Absolute: 37 10*3/uL (ref 19.0–186.0)
Retic Ct Pct: 1.1 % (ref 0.4–3.1)
Reticulocyte Hemoglobin: 24.3 pg — ABNORMAL LOW (ref >27.9)

## 2022-10-29 LAB — SAMPLE TO BLOOD BANK

## 2022-10-29 LAB — CMP (CANCER CENTER ONLY)
ALT: 8 U/L (ref 0–44)
AST: 14 U/L — ABNORMAL LOW (ref 15–41)
Albumin: 3.9 g/dL (ref 3.5–5.0)
Alkaline Phosphatase: 57 U/L (ref 38–126)
Anion gap: 7 (ref 5–15)
BUN: 15 mg/dL (ref 8–23)
CO2: 29 mmol/L (ref 22–32)
Calcium: 9 mg/dL (ref 8.9–10.3)
Chloride: 105 mmol/L (ref 98–111)
Creatinine: 0.8 mg/dL (ref 0.44–1.00)
GFR, Estimated: >60 mL/min (ref >60)
Glucose, Bld: 96 mg/dL (ref 70–99)
Potassium: 3.6 mmol/L (ref 3.5–5.1)
Sodium: 141 mmol/L (ref 135–145)
Total Bilirubin: 0.6 mg/dL (ref 0.3–1.2)
Total Protein: 6.9 g/dL (ref 6.5–8.1)

## 2022-10-29 LAB — FERRITIN: Ferritin: 5 ng/mL — ABNORMAL LOW (ref 11–307)

## 2022-10-29 LAB — IRON AND IRON BINDING CAPACITY (CC-WL,HP ONLY)
Iron: 18 ug/dL — ABNORMAL LOW (ref 28–170)
Saturation Ratios: 4 % — ABNORMAL LOW (ref 10.4–31.8)
TIBC: 442 ug/dL (ref 250–450)
UIBC: 424 ug/dL (ref 148–442)

## 2022-10-29 NOTE — Progress Notes (Signed)
S/w pt in [facility] waiting area regarding sample to BB. Her hgb is 8.4 today and she reports feeling weak and states last time her hgb dropped to 8, she received 1 unit PRBC. Advised pt to keep telephone visit with MD Thursday to further discuss. If sx worsen she needs to go to ED. Awaiting ferritin and iron studies to see if she is appropriate for Fe infusion.

## 2022-10-30 ENCOUNTER — Telehealth: Payer: Self-pay

## 2022-10-30 ENCOUNTER — Telehealth: Payer: Self-pay | Admitting: Hematology and Oncology

## 2022-10-30 ENCOUNTER — Other Ambulatory Visit: Payer: Self-pay | Admitting: Hematology and Oncology

## 2022-10-30 NOTE — Telephone Encounter (Signed)
Called Pt reagrding below inbasket message from MD:   "Can u schedule venofer on her asap.   Theora Gianotti, can u let her know that I ordered IV iron, I think Vernona Rieger might have mentioned to her already. If she is very asymptomatic and doesn't feel like she can wait until IV iron is done, she may unfortunately have to go to ER< but we will do our best to get her in ASAP. "  PA not required per Lurline Hare. Caled Pt to discuss, next day appt for 0730 available per infusion charge RN. Pt reports fatigue and weakness. Denies dyspnea, pain, or bleeding. Pt states her husband has dementia and needs a Comptroller. Pt reports she will try to find sitter coverage and to go ahead and schedule next day appt.

## 2022-10-30 NOTE — Progress Notes (Unsigned)
Venofer plan placed given severe anemia with iron deficiency Message sent to scheduling team to make it a priority to schedule.  Akif Weldy

## 2022-10-30 NOTE — Telephone Encounter (Signed)
Spoke with patient confirming upcoming appointment  

## 2022-10-31 ENCOUNTER — Other Ambulatory Visit: Payer: Self-pay

## 2022-10-31 ENCOUNTER — Inpatient Hospital Stay: Payer: Medicare Other | Admitting: Hematology and Oncology

## 2022-10-31 ENCOUNTER — Inpatient Hospital Stay: Payer: Medicare Other

## 2022-10-31 VITALS — BP 148/60 | HR 62 | Temp 98.6°F | Resp 14

## 2022-10-31 DIAGNOSIS — D508 Other iron deficiency anemias: Secondary | ICD-10-CM

## 2022-10-31 DIAGNOSIS — D509 Iron deficiency anemia, unspecified: Secondary | ICD-10-CM | POA: Diagnosis not present

## 2022-10-31 MED ORDER — SODIUM CHLORIDE 0.9 % IV SOLN
300.0000 mg | Freq: Once | INTRAVENOUS | Status: AC
  Administered 2022-10-31: 300 mg via INTRAVENOUS
  Filled 2022-10-31: qty 300

## 2022-10-31 MED ORDER — SODIUM CHLORIDE 0.9 % IV SOLN: Freq: Once | INTRAVENOUS | Status: AC

## 2022-10-31 NOTE — Patient Instructions (Signed)

## 2022-10-31 NOTE — Progress Notes (Signed)
Patient declined 30 min post observation.  VSS at discharge, tolerated treatment well without incident.

## 2022-11-06 ENCOUNTER — Inpatient Hospital Stay: Payer: Medicare Other

## 2022-11-06 ENCOUNTER — Other Ambulatory Visit: Payer: Self-pay

## 2022-11-06 VITALS — BP 176/72 | HR 62 | Temp 97.7°F | Resp 12

## 2022-11-06 DIAGNOSIS — D509 Iron deficiency anemia, unspecified: Secondary | ICD-10-CM | POA: Diagnosis not present

## 2022-11-06 DIAGNOSIS — D508 Other iron deficiency anemias: Secondary | ICD-10-CM

## 2022-11-06 MED ORDER — SODIUM CHLORIDE 0.9 % IV SOLN
300.0000 mg | Freq: Once | INTRAVENOUS | Status: AC
  Administered 2022-11-06: 300 mg via INTRAVENOUS
  Filled 2022-11-06: qty 300

## 2022-11-06 MED ORDER — SODIUM CHLORIDE 0.9 % IV SOLN: Freq: Once | INTRAVENOUS | Status: AC

## 2022-11-06 NOTE — Progress Notes (Signed)
Pt declined to be observed for 30 minutes post Venofer infusion. Pt tolerated trtmt well w/out incident. VSS at discharge.  Ambulatory to lobby.   

## 2022-11-06 NOTE — Patient Instructions (Signed)

## 2022-11-13 ENCOUNTER — Inpatient Hospital Stay: Payer: Medicare Other

## 2022-11-15 ENCOUNTER — Other Ambulatory Visit: Payer: Self-pay

## 2022-11-15 ENCOUNTER — Inpatient Hospital Stay: Payer: Medicare Other

## 2022-11-15 ENCOUNTER — Other Ambulatory Visit: Payer: Self-pay | Admitting: *Deleted

## 2022-11-15 VITALS — BP 154/70 | HR 64 | Temp 97.7°F | Resp 16

## 2022-11-15 DIAGNOSIS — D509 Iron deficiency anemia, unspecified: Secondary | ICD-10-CM | POA: Diagnosis not present

## 2022-11-15 DIAGNOSIS — D508 Other iron deficiency anemias: Secondary | ICD-10-CM

## 2022-11-15 DIAGNOSIS — K31819 Angiodysplasia of stomach and duodenum without bleeding: Secondary | ICD-10-CM

## 2022-11-15 LAB — RETIC PANEL
Immature Retic Fract: 12.9 % (ref 2.3–15.9)
RBC.: 3.51 MIL/uL — ABNORMAL LOW (ref 3.87–5.11)
Retic Count, Absolute: 52.3 10*3/uL (ref 19.0–186.0)
Retic Ct Pct: 1.5 % (ref 0.4–3.1)
Reticulocyte Hemoglobin: 29.7 pg (ref >27.9)

## 2022-11-15 LAB — SAMPLE TO BLOOD BANK

## 2022-11-15 LAB — CBC WITH DIFFERENTIAL (CANCER CENTER ONLY)
Abs Immature Granulocytes: 0.02 10*3/uL (ref 0.00–0.07)
Basophils Absolute: 0 10*3/uL (ref 0.0–0.1)
Basophils Relative: 0 %
Eosinophils Absolute: 0.3 10*3/uL (ref 0.0–0.5)
Eosinophils Relative: 6 %
HCT: 29.7 % — ABNORMAL LOW (ref 36.0–46.0)
Hemoglobin: 9 g/dL — ABNORMAL LOW (ref 12.0–15.0)
Immature Granulocytes: 0 %
Lymphocytes Relative: 16 %
Lymphs Abs: 0.8 10*3/uL (ref 0.7–4.0)
MCH: 26.3 pg (ref 26.0–34.0)
MCHC: 30.3 g/dL (ref 30.0–36.0)
MCV: 86.8 fL (ref 80.0–100.0)
Monocytes Absolute: 0.6 10*3/uL (ref 0.1–1.0)
Monocytes Relative: 12 %
Neutro Abs: 3.3 10*3/uL (ref 1.7–7.7)
Neutrophils Relative %: 66 %
Platelet Count: 293 10*3/uL (ref 150–400)
RBC: 3.42 MIL/uL — ABNORMAL LOW (ref 3.87–5.11)
RDW: 19.1 % — ABNORMAL HIGH (ref 11.5–15.5)
WBC Count: 5.1 10*3/uL (ref 4.0–10.5)
nRBC: 0 % (ref 0.0–0.2)

## 2022-11-15 LAB — IRON AND IRON BINDING CAPACITY (CC-WL,HP ONLY)
Iron: 750 ug/dL — ABNORMAL HIGH (ref 28–170)
Saturation Ratios: 207 % — ABNORMAL HIGH (ref 10.4–31.8)
TIBC: 363 ug/dL (ref 250–450)
UIBC: UNDETERMINED ug/dL (ref 148–442)

## 2022-11-15 LAB — FERRITIN: Ferritin: 102 ng/mL (ref 11–307)

## 2022-11-15 MED ORDER — SODIUM CHLORIDE 0.9 % IV SOLN
300.0000 mg | Freq: Once | INTRAVENOUS | Status: AC
  Administered 2022-11-15: 300 mg via INTRAVENOUS
  Filled 2022-11-15: qty 300

## 2022-11-15 MED ORDER — SODIUM CHLORIDE 0.9 % IV SOLN: Freq: Once | INTRAVENOUS | Status: AC

## 2022-11-15 NOTE — Patient Instructions (Signed)

## 2022-11-15 NOTE — Progress Notes (Signed)
Patient tolerated iron well. Declined 30 minute observation VSS- BP (!) 154/70 (BP Location: Right Arm, Patient Position: Sitting)   Pulse 64   Temp 97.7 F (36.5 C) (Oral)   Resp 16   SpO2 96%   Ambulatory to the lab- needs draw.

## 2022-11-18 ENCOUNTER — Other Ambulatory Visit: Payer: Self-pay

## 2022-11-18 DIAGNOSIS — D509 Iron deficiency anemia, unspecified: Secondary | ICD-10-CM

## 2022-11-18 DIAGNOSIS — D649 Anemia, unspecified: Secondary | ICD-10-CM

## 2022-11-18 DIAGNOSIS — K31811 Angiodysplasia of stomach and duodenum with bleeding: Secondary | ICD-10-CM

## 2022-11-27 ENCOUNTER — Encounter: Payer: Self-pay | Admitting: Internal Medicine

## 2022-11-27 ENCOUNTER — Ambulatory Visit (INDEPENDENT_AMBULATORY_CARE_PROVIDER_SITE_OTHER): Payer: Medicare Other | Admitting: Internal Medicine

## 2022-11-27 VITALS — BP 136/80 | HR 78 | Ht 63.0 in | Wt 115.0 lb

## 2022-11-27 DIAGNOSIS — K589 Irritable bowel syndrome without diarrhea: Secondary | ICD-10-CM | POA: Diagnosis not present

## 2022-11-27 DIAGNOSIS — K31819 Angiodysplasia of stomach and duodenum without bleeding: Secondary | ICD-10-CM | POA: Diagnosis not present

## 2022-11-27 DIAGNOSIS — D649 Anemia, unspecified: Secondary | ICD-10-CM | POA: Diagnosis not present

## 2022-11-27 DIAGNOSIS — R197 Diarrhea, unspecified: Secondary | ICD-10-CM | POA: Diagnosis not present

## 2022-11-27 MED ORDER — DIPHENOXYLATE-ATROPINE 2.5-0.025 MG PO TABS: 1.0000 | ORAL_TABLET | Freq: Four times a day (QID) | ORAL | 5 refills | Status: DC | PRN

## 2022-11-27 MED ORDER — DICYCLOMINE HCL 10 MG PO CAPS: 10.0000 mg | ORAL_CAPSULE | ORAL | 5 refills | Status: DC | PRN

## 2022-11-27 NOTE — Patient Instructions (Signed)
We have sent the following medications to your pharmacy for you to pick up at your convenience: Bentyl,Lomotil  Follow up in 6 months  If your blood pressure at your visit was 140/90 or greater, please contact your primary care physician to follow up on this.  _______________________________________________________  If you are age 76 or older, your body mass index should be between 23-30. Your Body mass index is 20.37 kg/m. If this is out of the aforementioned range listed, please consider follow up with your Primary Care Provider.  If you are age 36 or younger, your body mass index should be between 19-25. Your Body mass index is 20.37 kg/m. If this is out of the aformentioned range listed, please consider follow up with your Primary Care Provider.   ________________________________________________________  The [facility] providers would like to encourage you to use [facility] to communicate with providers for non-urgent requests or questions.  Due to long hold times on the telephone, sending your provider a message by [facility] may be a faster and more efficient way to get a response.  Please allow 48 business hours for a response.  Please remember that this is for non-urgent requests.  _______________________________________________________    Thank you for entrusting me with your care and for choosing [facility],  Dr. Eulah Pont

## 2022-11-27 NOTE — Progress Notes (Unsigned)
Chief Complaint: Anemia  HPI : 77 year old female with history of GAVE, breast cancer s/p lumpectomy and radiation, osteoporosis, prior C dif infection presents for follow up of anemia and diarrhea  Interval History: She is scheduled for another GAVE ablation next week at [facility]. She took the colestipol for 3 weeks, which seemed to help initially, but then she started developing abdominal pain again on the colestipol and had to stop the medication. The Lomotil is helping. She has still had diarrhea, but ever since she had her iron infusions, she has not had as much abdominal pain. Bentyl does still help with her abdominal pain. Sugar and fried foods do seem to cause more abdominal pain. She started omeprazole 40 mg every day. Endorses occasional bloating.   Wt Readings from Last 3 Encounters:  11/27/22 115 lb (52.2 kg)  08/27/22 114 lb 8 oz (51.9 kg)  06/25/22 113 lb (51.3 kg)    Current Outpatient Medications  Medication Sig Dispense Refill   amLODipine (NORVASC) 2.5 MG tablet Take 1-2 tablets (2.5-5 mg total) by mouth daily. 180 tablet 1   dicyclomine (BENTYL) 10 MG capsule Take 1 capsule (10 mg total) by mouth as needed for spasms. 30 capsule 1   diphenoxylate-atropine (LOMOTIL) 2.5-0.025 MG tablet Take 1 tablet by mouth 4 (four) times daily as needed for diarrhea or loose stools. 30 tablet 1   metoprolol succinate (TOPROL-XL) 50 MG 24 hr tablet TAKE 1 TABLET BY MOUTH DAILY. TAKE WITH OR IMMEDIATELY FOLLOWING A MEAL. 90 tablet 3   omeprazole (PRILOSEC) 40 MG capsule TAKE 1 CAPSULE (40 MG TOTAL) BY MOUTH DAILY. 90 capsule 0   moxifloxacin (VIGAMOX) 0.5 % ophthalmic solution Place 1 drop into the left eye 4 (four) times daily. (Patient not taking: Reported on 11/27/2022)     prednisoLONE acetate (PRED FORTE) 1 % ophthalmic suspension Place 1 drop into the left eye 4 (four) times daily. (Patient not taking: Reported on 11/27/2022)     No current facility-administered medications  for this visit.   Physical Exam: BP 136/80 (BP Location: Right Arm)   Pulse 78   Ht 5\' 3"  (1.6 m)   Wt 115 lb (52.2 kg)   BMI 20.37 kg/m  Constitutional: Pleasant,well-developed, female in no acute distress. HEENT: Normocephalic and atraumatic. Conjunctivae are normal. No scleral icterus. Cardiovascular: Normal rate Pulmonary/chest: Effort normal Abdominal: Soft, nondistended, nontender. Bowel sounds active throughout. There are no masses palpable. No hepatomegaly. Extremities: No edema Neurological: Alert and oriented to person place and time. Skin: Skin is warm and dry. No rashes noted. Psychiatric: Normal mood and affect. Behavior is normal.  Labs 07/2021: CBC with low Hb of 11. BMP nml. FOBT positive.  Labs 08/2021: CBC with low Hb of 10.4. Ferritin 201.  Labs 10/2021: CBC with low Hb of 10.4.  Labs 05/2022: CBC with low Hb of 9.9. Ferritin 126. CMP unremarkable.  Labs 10/2022: CBC with low Hb of 9. Ferritin nml at 102. Iron sat elevated.   TTE 07/23/17: Study Conclusions  - Left ventricle: The cavity size was normal. There was mild focal    basal hypertrophy of the septum. Systolic function was normal.    The estimated ejection fraction was in the range of 60% to 65%.    Wall motion was normal; there were no regional wall motion    abnormalities. Doppler parameters are consistent with abnormal    left ventricular relaxation (grade 1 diastolic dysfunction).  - Mitral valve: There was mild regurgitation.  -  Left atrium: The atrium was normal in size.  - Right ventricle: Systolic function was normal.  - Pulmonary arteries: Systolic pressure was within the normal    range.   CTA GI Bleed 07/18/21: IMPRESSION: VASCULAR 1. Visceral arteries in the abdomen and pelvis are widely patent. No evidence for acute or chronic mesenteric ischemia. 2. 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 Recommend follow-up CTA in 6-12 months to ensure  stability. 3.  Aortic Atherosclerosis (ICD10-I70.0). NON-VASCULAR 1. No evidence for active GI bleeding. 2. Extensive colonic diverticulosis without acute inflammation. 3. Perinephric edema or stranding along the posteroinferior aspect of both kidneys. Findings are nonspecific and age-indeterminate. Probable cyst in the right kidney upper pole. 4. Cholecystectomy. Mild intrahepatic biliary dilatation versus mild periportal edema. 5. Dependent changes in both lung bases, likely related atelectasis. 6. Indeterminate 4 mm nodule in the right middle lobe. No follow-up needed if patient is low-risk. Non-contrast chest CT can be considered in 12 months if patient is high-risk. This recommendation follows the consensus statement: Guidelines for Management of Incidental Pulmonary Nodules Detected on CT Images: From the Fleischner Society 2017; Radiology 2017; 284:228-243.  CTA Pelvis 03/15/22: IMPRESSION: Unchanged appearance of the 6 mm outpouching from the right internal iliac artery. Given that there is an artery origin from the dome, this might represent either an infundibulum or aneurysm Aortic Atherosclerosis (ICD10-I70.0). Additional ancillary findings as above.  Colonoscopy 01/27/14: Left sided diverticulosis. Internal hemorrhoids. Recommend repeat in 5 years.  Colonoscopy 04/05/19: Diverticulosis of the sigmoid colon. Internal hemorrhoids. Recommended repeat colonoscopy in 5 years due to personal history of adenoma  EGD 08/08/20: Esophagus and duodenum were normal. 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   ASSESSMENT AND PLAN: Diarrhea Midline abdominal pain IBS History of cholecystectomy Anemia History of GAVE Patient was unfortunately  not able to tolerate the colestipol medication.  However, she is still doing well on Bentyl and Lomotil.  Thus we will go ahead and keep her on this regimen at this time.  I did offer the patient a course of antibiotic therapy for treatment of possible IBS-D or SIBO, but the patient is hesitant at this time because she has a history of severe C. difficile infection.  If patient's symptoms worsen in the future, could reconsider antibiotic therapy or a colonoscopy to obtain biopsies to rule out underlying microscopic colitis.  Patient currently gets her GAVE treatment with [facility], but she may consider transferring her RFA treatments every 6 months to our practice. - Previously gave information on low FODMAP diet - Cont Bentyl PRN for ab pain. Refill. - Cont Lomotil PRN for diarrhea. Refill. - RTC 6 months   Eulah Pont, MD  I spent 40 minutes of time, including in depth chart review, independent review of results as outlined above, communicating results with the patient directly, face-to-face time with the patient, coordinating care, and ordering studies and medications as appropriate, and documentation.

## 2022-11-29 ENCOUNTER — Telehealth: Payer: Self-pay | Admitting: Adult Health

## 2022-11-29 NOTE — Telephone Encounter (Signed)
Scheduled appointments per staff message. Patient is aware of the made appointments. 

## 2022-12-18 ENCOUNTER — Other Ambulatory Visit: Payer: Self-pay | Admitting: Internal Medicine

## 2022-12-18 ENCOUNTER — Encounter (INDEPENDENT_AMBULATORY_CARE_PROVIDER_SITE_OTHER): Payer: Self-pay

## 2022-12-18 ENCOUNTER — Other Ambulatory Visit: Payer: Self-pay | Admitting: Family Medicine

## 2022-12-30 ENCOUNTER — Ambulatory Visit (INDEPENDENT_AMBULATORY_CARE_PROVIDER_SITE_OTHER): Payer: Medicare Other

## 2022-12-30 VITALS — Ht 65.0 in | Wt 115.0 lb

## 2022-12-30 DIAGNOSIS — Z0000 Encounter for general adult medical examination without abnormal findings: Secondary | ICD-10-CM

## 2022-12-30 NOTE — Progress Notes (Signed)
Subjective:   Kristi Carr is a 77 y.o. female who presents for Medicare Annual (Subsequent) preventive examination.  Visit Complete: Virtual  I connected with  Fransisco Hertz on 12/30/22 by a audio enabled telemedicine application and verified that I am speaking with the correct person using two identifiers.  Patient Location: Home  Provider Location: Home Office  I discussed the limitations of evaluation and management by telemedicine. The patient expressed understanding and agreed to proceed.  Vital Signs: Unable to obtain new vitals due to this being a telehealth visit. HT and WT pt reported.  Review of Systems     Cardiac Risk Factors include: advanced age (>93men, >35 women)     Objective:    Today's Vitals   12/30/22 1425  Weight: 115 lb (52.2 kg)  Height: 5\' 5"  (1.651 m)   Body mass index is 19.14 kg/m.     12/30/2022    2:31 PM 12/27/2021   11:32 AM 07/18/2021    5:03 AM 03/05/2021   12:27 PM 12/20/2020    1:14 PM 08/04/2020    1:40 PM 05/12/2020    8:05 AM  Advanced Directives  Does Patient Have a Medical Advance Directive? Yes Yes No Yes Yes No No  Type of Estate agent of [place];Living will Healthcare Power of [place];Living will  Healthcare Power of [place];Living will Healthcare Power of [place];Living will    Does patient want to make changes to medical advance directive? No - Patient declined Yes (Inpatient - patient defers changing a medical advance directive and declines information at this time)  No - Patient declined  No - Patient declined   Copy of Healthcare Power of Attorney in Chart? Yes - validated most recent copy scanned in chart (See row information) Yes - validated most recent copy scanned in chart (See row information)  Yes - validated most recent copy scanned in chart (See row information) Yes - validated most recent copy scanned in chart (See row information)    Would patient like information on creating a medical advance  directive?   No - Patient declined No - Patient declined  No - Patient declined     Current Medications (verified) Outpatient Encounter Medications as of 12/30/2022  Medication Sig   amLODipine (NORVASC) 2.5 MG tablet TAKE 1-2 TABLETS (2.5-5 MG TOTAL) BY MOUTH DAILY.   dicyclomine (BENTYL) 10 MG capsule Take 1 capsule (10 mg total) by mouth as needed for spasms.   diphenoxylate-atropine (LOMOTIL) 2.5-0.025 MG tablet Take 1 tablet by mouth 4 (four) times daily as needed for diarrhea or loose stools.   metoprolol succinate (TOPROL-XL) 50 MG 24 hr tablet TAKE 1 TABLET BY MOUTH DAILY. TAKE WITH OR IMMEDIATELY FOLLOWING A MEAL.   omeprazole (PRILOSEC) 40 MG capsule TAKE 1 CAPSULE (40 MG TOTAL) BY MOUTH DAILY.   moxifloxacin (VIGAMOX) 0.5 % ophthalmic solution Place 1 drop into the left eye 4 (four) times daily. (Patient not taking: Reported on 11/27/2022)   prednisoLONE acetate (PRED FORTE) 1 % ophthalmic suspension Place 1 drop into the left eye 4 (four) times daily. (Patient not taking: Reported on 11/27/2022)   No facility-administered encounter medications on file as of 12/30/2022.    Allergies (verified) Clindamycin/lincomycin   History: Past Medical History:  Diagnosis Date   Anemia    Breast cancer of upper-outer quadrant of left female breast (HCC)    C. difficile diarrhea    Cataract    Colon polyps    Gallstones 08/23/2003  x 2   Gastric hemorrhage due to gastric antral vascular ectasia (GAVE)    s/p radiofrequency ablation    GAVE (gastric antral vascular ectasia)    Hot flashes    Hypertension    Kidney stones    Radiation 10/14/13-12/02/13   left breast 50.4 gray, lumpectomy cavity boosted to 63 gray   Wears glasses    Past Surgical History:  Procedure Laterality Date   BREAST LUMPECTOMY WITH SENTINEL LYMPH NODE BIOPSY Left 09/06/2013   LEFT BREAST SEED GUIDED LUMPECTOMY WITH LEFT AXILLARY SENTINEL NODE BIOPSY (Left)   CATARACT EXTRACTION, BILATERAL Bilateral     right-07/31/22, left-08/14/22   CHOLECYSTECTOMY     COLONOSCOPY     PARTIAL HYSTERECTOMY  05/20/1978   ovaries left in   TONSILLECTOMY     TUBAL LIGATION     WISDOM TOOTH EXTRACTION     Family History  Problem Relation Age of Onset   Diabetes Mother    Hyperlipidemia Mother    Hypertension Mother    Breast cancer Mother    Heart attack Father    Heart disease Father    Hypertension Sister    Diabetes Sister    Heart disease Brother        CHF  EF 10%   Hypertension Brother    Gout Brother    Depression Neg Hx    Alcohol abuse Neg Hx    Drug abuse Neg Hx    Colon cancer Neg Hx    Uterine cancer Neg Hx    Ovarian cancer Neg Hx    Prostate cancer Neg Hx    Social History   Socioeconomic History   Marital status: Married    Spouse name: Not on file   Number of children: 2   Years of education: Not on file   Highest education level: Not on file  Occupational History   Occupation: Retired from [employer] in 2008  Tobacco Use   Smoking status: Never   Smokeless tobacco: Never  Vaping Use   Vaping status: Never Used  Substance and Sexual Activity   Alcohol use: No   Drug use: No   Sexual activity: Yes  Other Topics Concern   Not on file  Social History Narrative   Married 1966   2 kids   4 grand children   Retired from Photographer   Social Determinants of Health   Financial Resource Strain: Low Risk  (12/30/2022)   Overall Financial Resource Strain (CARDIA)    Difficulty of Paying Living Expenses: Not hard at all  Food Insecurity: No Food Insecurity (12/30/2022)   Hunger Vital Sign    Worried About Running Out of Food in the Last Year: Never true    Ran Out of Food in the Last Year: Never true  Transportation Needs: No Transportation Needs (12/30/2022)   PRAPARE - Administrator, Civil Service (Medical): No    Lack of Transportation (Non-Medical): No  Physical Activity: Insufficiently Active (12/30/2022)   Exercise Vital Sign    Days of Exercise  per Week: 4 days    Minutes of Exercise per Session: 30 min  Stress: No Stress Concern Present (12/30/2022)   [employer] of Occupational Health - Occupational Stress Questionnaire    Feeling of Stress : Not at all  Social Connections: Moderately Isolated (12/30/2022)   Social Connection and Isolation Panel [NHANES]    Frequency of Communication with Friends and Family: More than three times a week    Frequency of  Social Gatherings with Friends and Family: More than three times a week    Attends Religious Services: Never    Database administrator or Organizations: No    Attends Engineer, structural: Never    Marital Status: Married    Tobacco Counseling Counseling given: Not Answered   Clinical Intake:  Pre-visit preparation completed: Yes  Pain : No/denies pain     BMI - recorded: 19.14 Nutritional Status: BMI of 19-24  Normal Nutritional Risks: None Diabetes: No  How often do you need to have someone help you when you read instructions, pamphlets, or other written materials from your doctor or pharmacy?: 1 - Never  Interpreter Needed?: No  Information entered by :: C. LPN   Activities of Daily Living    12/30/2022    2:32 PM  In your present state of health, do you have any difficulty performing the following activities:  Hearing? 0  Vision? 0  Difficulty concentrating or making decisions? 0  Walking or climbing stairs? 0  Dressing or bathing? 0  Doing errands, shopping? 0  Preparing Food and eating ? N  Using the Toilet? N  In the past six months, have you accidently leaked urine? Y  Comment occasionally at night  Do you have problems with loss of bowel control? N  Managing your Medications? N  Managing your Finances? N  Housekeeping or managing your Housekeeping? N    Patient Care Team: Joaquim Nam, MD as PCP - General (Family Medicine) Huel Cote, MD as Consulting Physician (Obstetrics and Gynecology) Glenford Peers, [place] (Optometry) Emelia Loron, MD as Consulting Physician (General Surgery) Antony Blackbird, MD as Consulting Physician (Radiation Oncology) Sharrell Ku, MD as Consulting Physician (Gastroenterology) Rachel Moulds, MD as Consulting Physician (Hematology and Oncology)  Indicate any recent Medical Services you may have received from other than [facility] providers in the past year (date may be approximate).     Assessment:   This is a routine wellness examination for [place].  Hearing/Vision screen Hearing Screening - Comments:: Denies hearing difficulties   Vision Screening - Comments:: Glasses - UTD on eye exams - [facility]  Dietary issues and exercise activities discussed:     Goals Addressed             This Visit's Progress    Patient Stated       Stay active.       Depression Screen    12/30/2022    2:29 PM 06/25/2022    9:07 AM 12/27/2021   11:30 AM 12/20/2020    1:16 PM 12/28/2019   10:37 AM 12/18/2018   10:05 AM 12/08/2017    8:44 AM  PHQ 2/9 Scores  PHQ - 2 Score 0 0 0 0 0 0 0  PHQ- 9 Score  0 0 0   0    Fall Risk    12/30/2022    2:32 PM 06/25/2022    9:07 AM 12/27/2021   11:33 AM 12/20/2020    1:15 PM 12/28/2019   10:37 AM  Fall Risk   Falls in the past year? 0 0 0 0 0  Number falls in past yr: 0 0 0 0   Injury with Fall? 0 0 0 0   Risk for fall due to : No Fall Risks No Fall Risks No Fall Risks No Fall Risks   Follow up Falls prevention discussed;Falls evaluation completed Falls evaluation completed Falls evaluation completed Falls evaluation completed;Falls prevention discussed  MEDICARE RISK AT HOME:  Medicare Risk at Home - 12/30/22 1433     Any stairs in or around the home? Yes    If so, are there any without handrails? No    Home free of loose throw rugs in walkways, pet beds, electrical cords, etc? Yes    Adequate lighting in your home to reduce risk of falls? Yes    Life alert? No    Use of a cane, walker or w/c? No    Grab bars  in the bathroom? Yes    Shower chair or bench in shower? Yes    Elevated toilet seat or a handicapped toilet? Yes             TIMED UP AND GO:  Was the test performed?  No    Cognitive Function:    12/20/2020    1:17 PM 12/08/2017    8:44 AM 12/04/2016   10:43 AM  MMSE - Mini Mental State Exam  Orientation to time 5 5 5   Orientation to Place 5 5 5   Registration 3 3 3   Attention/ Calculation 5 0 0  Recall 3 3 2   Language- name 2 objects  0 0  Language- repeat 1 1 1   Language- follow 3 step command  3 3  Language- read & follow direction  0 0  Write a sentence  0 0  Copy design  0 0  Total score  20 19        12/30/2022    2:33 PM 12/27/2021   11:34 AM  6CIT Screen  What Year? 0 points 0 points  What month? 0 points 0 points  What time? 0 points 0 points  Count back from 20 0 points 0 points  Months in reverse 0 points 0 points  Repeat phrase 0 points 2 points  Total Score 0 points 2 points    Immunizations Immunization History  Administered Date(s) Administered   Influenza Whole 04/10/2013   Influenza, High Dose Seasonal PF 02/23/2018, 02/08/2019   Influenza,inj,Quad PF,6+ Mos 05/23/2015, 03/10/2017   Influenza,inj,quad, With Preservative 02/27/2021   Influenza-Unspecified 05/23/2015, 03/10/2017   PFIZER(Purple Top)SARS-COV-2 Vaccination 06/25/2019, 07/16/2019, 05/17/2020   Pneumococcal Conjugate-13 05/23/2015   Pneumococcal Polysaccharide-23 11/02/2012   Tdap 12/05/2010   Zoster Recombinant(Shingrix) 01/11/2019, 05/17/2019   Zoster, Live 06/22/2008, 01/11/2019, 05/17/2019    TDAP status: Due, Education has been provided regarding the importance of this vaccine. Advised may receive this vaccine at local pharmacy or Health Dept. Aware to provide a copy of the vaccination record if obtained from local pharmacy or Health Dept. Verbalized acceptance and understanding.  Flu Vaccine status: Due, Education has been provided regarding the importance of this  vaccine. Advised may receive this vaccine at local pharmacy or Health Dept. Aware to provide a copy of the vaccination record if obtained from local pharmacy or Health Dept. Verbalized acceptance and understanding.  Pneumococcal vaccine status: Up to date  Covid-19 vaccine status: Information provided on how to obtain vaccines.   Qualifies for Shingles Vaccine? Yes   Zostavax completed Yes   Shingrix Completed?: No.    Education has been provided regarding the importance of this vaccine. Patient has been advised to call insurance company to determine out of pocket expense if they have not yet received this vaccine. Advised may also receive vaccine at local pharmacy or Health Dept. Verbalized acceptance and understanding.  Screening Tests Health Maintenance  Topic Date Due   Zoster Vaccines- Shingrix (2 of 2) 07/12/2019  DTaP/Tdap/Td (2 - Td or Tdap) 12/04/2020   INFLUENZA VACCINE  12/19/2022   Medicare Annual Wellness (AWV)  12/30/2023   Colonoscopy  04/04/2024   Pneumonia Vaccine 82+ Years old  Completed   DEXA SCAN  Completed   Hepatitis C Screening  Completed   HPV VACCINES  Aged Out   COVID-19 Vaccine  Discontinued    Health Maintenance  Health Maintenance Due  Topic Date Due   Zoster Vaccines- Shingrix (2 of 2) 07/12/2019   DTaP/Tdap/Td (2 - Td or Tdap) 12/04/2020   INFLUENZA VACCINE  12/19/2022    Colorectal cancer screening: No longer required.   Mammogram status: Completed 08/22/22. Repeat every year  Bone Density -Previously  Discontinued  Lung Cancer Screening: (Low Dose CT Chest recommended if Age 3-80 years, 20 pack-year currently smoking OR have quit w/in 15years.) does not qualify.   Lung Cancer Screening Referral: no  Additional Screening:  Hepatitis C Screening: does qualify; Completed 12/04/16  Vision Screening: Recommended annual ophthalmology exams for early detection of glaucoma and other disorders of the eye. Is the patient up to date with their  annual eye exam?  Yes  Who is the provider or what is the name of the office in which the patient attends annual eye exams? [facility] If pt is not established with a provider, would they like to be referred to a provider to establish care? Yes .   Dental Screening: Recommended annual dental exams for proper oral hygiene    Community Resource Referral / Chronic Care Management: CRR required this visit?  No   CCM required this visit?  No     Plan:     I have personally reviewed and noted the following in the patient's chart:   Medical and social history Use of alcohol, tobacco or illicit drugs  Current medications and supplements including opioid prescriptions. Patient is not currently taking opioid prescriptions. Functional ability and status Nutritional status Physical activity Advanced directives List of other physicians Hospitalizations, surgeries, and ER visits in previous 12 months Vitals Screenings to include cognitive, depression, and falls Referrals and appointments  In addition, I have reviewed and discussed with patient certain preventive protocols, quality metrics, and best practice recommendations. A written personalized care plan for preventive services as well as general preventive health recommendations were provided to patient.     Maryan Puls, LPN   11/17/1599   After Visit Summary: (MyChart) Due to this being a telephonic visit, the after visit summary with patients personalized plan was offered to patient via MyChart   Nurse Notes: None

## 2022-12-30 NOTE — Patient Instructions (Signed)
Kristi Carr , Thank you for taking time to come for your Medicare Wellness Visit. I appreciate your ongoing commitment to your health goals. Please review the following plan we discussed and let me know if I can assist you in the future.   Referrals/Orders/Follow-Ups/Clinician Recommendations: Aim for 30 minutes of exercise or brisk walking, 6-8 glasses of water, and 5 servings of fruits and vegetables each day.   This is a list of the screening recommended for you and due dates:  Health Maintenance  Topic Date Due   Zoster (Shingles) Vaccine (2 of 2) 07/12/2019   DTaP/Tdap/Td vaccine (2 - Td or Tdap) 12/04/2020   Medicare Annual Wellness Visit  12/28/2022   Flu Shot  12/19/2022   Colon Cancer Screening  04/04/2024   Pneumonia Vaccine  Completed   DEXA scan (bone density measurement)  Completed   Hepatitis C Screening  Completed   HPV Vaccine  Aged Out   COVID-19 Vaccine  Discontinued    Advanced directives: (In Chart) A copy of your advanced directives are scanned into your chart should your provider ever need it.  Next Medicare Annual Wellness Visit scheduled for next year: Yes  Preventive Care 63 Years and Older, Female Preventive care refers to lifestyle choices and visits with your health care provider that can promote health and wellness. What does preventive care include? A yearly physical exam. This is also called an annual well check. Dental exams once or twice a year. Routine eye exams. Ask your health care provider how often you should have your eyes checked. Personal lifestyle choices, including: Daily care of your teeth and gums. Regular physical activity. Eating a healthy diet. Avoiding tobacco and drug use. Limiting alcohol use. Practicing safe sex. Taking low-dose aspirin every day. Taking vitamin and mineral supplements as recommended by your health care provider. What happens during an annual well check? The services and screenings done by your health care  provider during your annual well check will depend on your age, overall health, lifestyle risk factors, and family history of disease. Counseling  Your health care provider may ask you questions about your: Alcohol use. Tobacco use. Drug use. Emotional well-being. Home and relationship well-being. Sexual activity. Eating habits. History of falls. Memory and ability to understand (cognition). Work and work Astronomer. Reproductive health. Screening  You may have the following tests or measurements: Height, weight, and BMI. Blood pressure. Lipid and cholesterol levels. These may be checked every 5 years, or more frequently if you are over 76 years old. Skin check. Lung cancer screening. You may have this screening every year starting at age 76 if you have a 30-pack-year history of smoking and currently smoke or have quit within the past 15 years. Fecal occult blood test (FOBT) of the stool. You may have this test every year starting at age 76. Flexible sigmoidoscopy or colonoscopy. You may have a sigmoidoscopy every 5 years or a colonoscopy every 10 years starting at age 76. Hepatitis C blood test. Hepatitis B blood test. Sexually transmitted disease (STD) testing. Diabetes screening. This is done by checking your blood sugar (glucose) after you have not eaten for a while (fasting). You may have this done every 1-3 years. Bone density scan. This is done to screen for osteoporosis. You may have this done starting at age 76. Mammogram. This may be done every 1-2 years. Talk to your health care provider about how often you should have regular mammograms. Talk with your health care provider about your test results,  treatment options, and if necessary, the need for more tests. Vaccines  Your health care provider may recommend certain vaccines, such as: Influenza vaccine. This is recommended every year. Tetanus, diphtheria, and acellular pertussis (Tdap, Td) vaccine. You may need a Td  booster every 10 years. Zoster vaccine. You may need this after age 76. Pneumococcal 13-valent conjugate (PCV13) vaccine. One dose is recommended after age 40. Pneumococcal polysaccharide (PPSV23) vaccine. One dose is recommended after age 76. Talk to your health care provider about which screenings and vaccines you need and how often you need them. This information is not intended to replace advice given to you by your health care provider. Make sure you discuss any questions you have with your health care provider. Document Released: 06/02/2015 Document Revised: 01/24/2016 Document Reviewed: 03/07/2015 Elsevier Interactive Patient Education  2017 [employer].  Fall Prevention in the Home Falls can cause injuries. They can happen to people of all ages. There are many things you can do to make your home safe and to help prevent falls. What can I do on the outside of my home? Regularly fix the edges of walkways and driveways and fix any cracks. Remove anything that might make you trip as you walk through a door, such as a raised step or threshold. Trim any bushes or trees on the path to your home. Use bright outdoor lighting. Clear any walking paths of anything that might make someone trip, such as rocks or tools. Regularly check to see if handrails are loose or broken. Make sure that both sides of any steps have handrails. Any raised decks and porches should have guardrails on the edges. Have any leaves, snow, or ice cleared regularly. Use sand or salt on walking paths during winter. Clean up any spills in your garage right away. This includes oil or grease spills. What can I do in the bathroom? Use night lights. Install grab bars by the toilet and in the tub and shower. Do not use towel bars as grab bars. Use non-skid mats or decals in the tub or shower. If you need to sit down in the shower, use a plastic, non-slip stool. Keep the floor dry. Clean up any water that spills on the floor  as soon as it happens. Remove soap buildup in the tub or shower regularly. Attach bath mats securely with double-sided non-slip rug tape. Do not have throw rugs and other things on the floor that can make you trip. What can I do in the bedroom? Use night lights. Make sure that you have a light by your bed that is easy to reach. Do not use any sheets or blankets that are too big for your bed. They should not hang down onto the floor. Have a firm chair that has side arms. You can use this for support while you get dressed. Do not have throw rugs and other things on the floor that can make you trip. What can I do in the kitchen? Clean up any spills right away. Avoid walking on wet floors. Keep items that you use a lot in easy-to-reach places. If you need to reach something above you, use a strong step stool that has a grab bar. Keep electrical cords out of the way. Do not use floor polish or wax that makes floors slippery. If you must use wax, use non-skid floor wax. Do not have throw rugs and other things on the floor that can make you trip. What can I do with my stairs? Do  not leave any items on the stairs. Make sure that there are handrails on both sides of the stairs and use them. Fix handrails that are broken or loose. Make sure that handrails are as long as the stairways. Check any carpeting to make sure that it is firmly attached to the stairs. Fix any carpet that is loose or worn. Avoid having throw rugs at the top or bottom of the stairs. If you do have throw rugs, attach them to the floor with carpet tape. Make sure that you have a light switch at the top of the stairs and the bottom of the stairs. If you do not have them, ask someone to add them for you. What else can I do to help prevent falls? Wear shoes that: Do not have high heels. Have rubber bottoms. Are comfortable and fit you well. Are closed at the toe. Do not wear sandals. If you use a stepladder: Make sure that it is  fully opened. Do not climb a closed stepladder. Make sure that both sides of the stepladder are locked into place. Ask someone to hold it for you, if possible. Clearly mark and make sure that you can see: Any grab bars or handrails. First and last steps. Where the edge of each step is. Use tools that help you move around (mobility aids) if they are needed. These include: Canes. Walkers. Scooters. Crutches. Turn on the lights when you go into a dark area. Replace any light bulbs as soon as they burn out. Set up your furniture so you have a clear path. Avoid moving your furniture around. If any of your floors are uneven, fix them. If there are any pets around you, be aware of where they are. Review your medicines with your doctor. Some medicines can make you feel dizzy. This can increase your chance of falling. Ask your doctor what other things that you can do to help prevent falls. This information is not intended to replace advice given to you by your health care provider. Make sure you discuss any questions you have with your health care provider. Document Released: 03/02/2009 Document Revised: 10/12/2015 Document Reviewed: 06/10/2014 Elsevier Interactive Patient Education  2017 [employer].

## 2023-01-08 ENCOUNTER — Other Ambulatory Visit: Payer: Self-pay | Admitting: Family Medicine

## 2023-01-08 DIAGNOSIS — E559 Vitamin D deficiency, unspecified: Secondary | ICD-10-CM

## 2023-01-08 DIAGNOSIS — I10 Essential (primary) hypertension: Secondary | ICD-10-CM

## 2023-01-09 ENCOUNTER — Other Ambulatory Visit (INDEPENDENT_AMBULATORY_CARE_PROVIDER_SITE_OTHER): Payer: Medicare Other

## 2023-01-09 DIAGNOSIS — I10 Essential (primary) hypertension: Secondary | ICD-10-CM | POA: Diagnosis not present

## 2023-01-09 DIAGNOSIS — E559 Vitamin D deficiency, unspecified: Secondary | ICD-10-CM

## 2023-01-09 LAB — LIPID PANEL
Cholesterol: 175 mg/dL (ref 0–200)
HDL: 52.3 mg/dL (ref >39.00)
LDL Cholesterol: 107 mg/dL — ABNORMAL HIGH (ref 0–99)
NonHDL: 122.42
Total CHOL/HDL Ratio: 3
Triglycerides: 76 mg/dL (ref 0.0–149.0)
VLDL: 15.2 mg/dL (ref 0.0–40.0)

## 2023-01-10 LAB — VITAMIN D 25 HYDROXY (VIT D DEFICIENCY, FRACTURES): VITD: 34.39 ng/mL (ref 30.00–100.00)

## 2023-01-16 ENCOUNTER — Ambulatory Visit: Payer: Medicare Other | Admitting: Family Medicine

## 2023-01-16 ENCOUNTER — Encounter: Payer: Self-pay | Admitting: Family Medicine

## 2023-01-16 VITALS — BP 104/64 | HR 83 | Temp 98.4°F | Ht 65.0 in | Wt 111.0 lb

## 2023-01-16 DIAGNOSIS — Z7189 Other specified counseling: Secondary | ICD-10-CM

## 2023-01-16 DIAGNOSIS — D508 Other iron deficiency anemias: Secondary | ICD-10-CM

## 2023-01-16 DIAGNOSIS — Z0000 Encounter for general adult medical examination without abnormal findings: Secondary | ICD-10-CM

## 2023-01-16 DIAGNOSIS — I10 Essential (primary) hypertension: Secondary | ICD-10-CM

## 2023-01-16 MED ORDER — METOPROLOL SUCCINATE ER 50 MG PO TB24: 50.0000 mg | ORAL_TABLET | Freq: Every day | ORAL | 3 refills | Status: DC

## 2023-01-16 NOTE — Patient Instructions (Addendum)
I would get a flu shot each fall.   Take care.  Glad to see you.

## 2023-01-16 NOTE — Progress Notes (Signed)
Hypertension:   Taking metoprolol daily with amlodipine in the AM. 2nd dose of amlodipine in the PM if needed.  Using medication without problems or lightheadedness: yes Chest pain with exertion:no Edema:no Short of breath:no Variable BP elevations that improve on recheck.   Labs d/w pt.   Vit D wnl. D/w pt about restart vit D 2000.    GAVE/IDA d/w pt.  She had ablation.  Her abd pain improved with iron infusion.  No pain now.  She has more pain when her iron level is low but not with eating.    She is caring for her husband who has memory loss.  Pt has arranged for a sitter, her son is helping.    Flu to be done at pharmacy.   Shingles prev done PNA up to date Covid vaccine prev done Tetanus 2012 RSV d/w pt.  Colonoscopy 2020 Breast cancer screening up to date Advance directive- both children equally designated if patient were incapacitated.  DXA done per outside clinic 2018 with osteoporosis.   she wanted to defer, d/w pt.    Meds, vitals, and allergies reviewed.   PMH and SH reviewed  ROS: Per HPI unless specifically indicated in ROS section   GEN: nad, alert and oriented HEENT: ncat NECK: supple w/o LA CV: rrr. PULM: ctab, no inc wob ABD: soft, +bs EXT: no edema SKIN: no acute rash  30 minutes were devoted to patient care in this encounter (this includes time spent reviewing the patient's file/history, interviewing and examining the patient, counseling/reviewing plan with patient).

## 2023-01-20 NOTE — Assessment & Plan Note (Signed)
Flu to be done at pharmacy.   Shingles prev done PNA up to date Covid vaccine prev done Tetanus 2012 RSV d/w pt.  Colonoscopy 2020 Breast cancer screening up to date Advance directive- both children equally designated if patient were incapacitated.  DXA done per outside clinic 2018 with osteoporosis.   she wanted to defer, d/w pt.

## 2023-01-20 NOTE — Assessment & Plan Note (Signed)
Variable BP elevations that improve on recheck.   Labs d/w pt.  Taking metoprolol daily with amlodipine in the AM. 2nd dose of amlodipine in the PM if needed.  Would continue as is.  Update me as needed.

## 2023-01-20 NOTE — Assessment & Plan Note (Signed)
Advance directive- both children equally designated if patient were incapacitated.

## 2023-01-20 NOTE — Assessment & Plan Note (Signed)
  GAVE/IDA d/w pt.  She had ablation.  Her abd pain improved with iron infusion.  No pain now.  She has more pain when her iron level is low but not with eating.   Benign abdominal exam today.  She has no follow-up with GI.

## 2023-02-05 ENCOUNTER — Telehealth: Payer: Self-pay | Admitting: Adult Health

## 2023-02-05 NOTE — Telephone Encounter (Signed)
Left a message in regards to rescheduled appointment times/dates due to provider being out of office

## 2023-02-11 ENCOUNTER — Inpatient Hospital Stay: Payer: Medicare Other | Attending: Hematology and Oncology

## 2023-02-11 DIAGNOSIS — D509 Iron deficiency anemia, unspecified: Secondary | ICD-10-CM

## 2023-02-11 DIAGNOSIS — Z853 Personal history of malignant neoplasm of breast: Secondary | ICD-10-CM | POA: Diagnosis present

## 2023-02-11 DIAGNOSIS — D508 Other iron deficiency anemias: Secondary | ICD-10-CM

## 2023-02-11 DIAGNOSIS — D649 Anemia, unspecified: Secondary | ICD-10-CM | POA: Insufficient documentation

## 2023-02-11 DIAGNOSIS — K31811 Angiodysplasia of stomach and duodenum with bleeding: Secondary | ICD-10-CM

## 2023-02-11 DIAGNOSIS — K31819 Angiodysplasia of stomach and duodenum without bleeding: Secondary | ICD-10-CM

## 2023-02-11 DIAGNOSIS — E538 Deficiency of other specified B group vitamins: Secondary | ICD-10-CM | POA: Diagnosis not present

## 2023-02-11 LAB — CBC WITH DIFFERENTIAL (CANCER CENTER ONLY)
Abs Immature Granulocytes: 0.03 10*3/uL (ref 0.00–0.07)
Basophils Absolute: 0 10*3/uL (ref 0.0–0.1)
Basophils Relative: 0 %
Eosinophils Absolute: 0.3 10*3/uL (ref 0.0–0.5)
Eosinophils Relative: 6 %
HCT: 33.5 % — ABNORMAL LOW (ref 36.0–46.0)
Hemoglobin: 10.6 g/dL — ABNORMAL LOW (ref 12.0–15.0)
Immature Granulocytes: 1 %
Lymphocytes Relative: 19 %
Lymphs Abs: 0.9 10*3/uL (ref 0.7–4.0)
MCH: 28.4 pg (ref 26.0–34.0)
MCHC: 31.6 g/dL (ref 30.0–36.0)
MCV: 89.8 fL (ref 80.0–100.0)
Monocytes Absolute: 0.6 10*3/uL (ref 0.1–1.0)
Monocytes Relative: 12 %
Neutro Abs: 3.2 10*3/uL (ref 1.7–7.7)
Neutrophils Relative %: 62 %
Platelet Count: 261 10*3/uL (ref 150–400)
RBC: 3.73 MIL/uL — ABNORMAL LOW (ref 3.87–5.11)
RDW: 16.4 % — ABNORMAL HIGH (ref 11.5–15.5)
WBC Count: 5 10*3/uL (ref 4.0–10.5)
nRBC: 0 % (ref 0.0–0.2)

## 2023-02-11 LAB — RETIC PANEL
Immature Retic Fract: 9.5 % (ref 2.3–15.9)
RBC.: 3.72 MIL/uL — ABNORMAL LOW (ref 3.87–5.11)
Retic Count, Absolute: 36.1 10*3/uL (ref 19.0–186.0)
Retic Ct Pct: 1 % (ref 0.4–3.1)
Reticulocyte Hemoglobin: 31.9 pg (ref >27.9)

## 2023-02-11 LAB — VITAMIN B12: Vitamin B-12: 265 pg/mL (ref 180–914)

## 2023-02-11 LAB — CMP (CANCER CENTER ONLY)
ALT: 11 U/L (ref 0–44)
AST: 19 U/L (ref 15–41)
Albumin: 4.2 g/dL (ref 3.5–5.0)
Alkaline Phosphatase: 59 U/L (ref 38–126)
Anion gap: 6 (ref 5–15)
BUN: 16 mg/dL (ref 8–23)
CO2: 32 mmol/L (ref 22–32)
Calcium: 9.5 mg/dL (ref 8.9–10.3)
Chloride: 104 mmol/L (ref 98–111)
Creatinine: 0.82 mg/dL (ref 0.44–1.00)
GFR, Estimated: >60 mL/min (ref >60)
Glucose, Bld: 91 mg/dL (ref 70–99)
Potassium: 3.8 mmol/L (ref 3.5–5.1)
Sodium: 142 mmol/L (ref 135–145)
Total Bilirubin: 0.6 mg/dL (ref 0.3–1.2)
Total Protein: 7.2 g/dL (ref 6.5–8.1)

## 2023-02-11 LAB — IRON AND IRON BINDING CAPACITY (CC-WL,HP ONLY)
Iron: 48 ug/dL (ref 28–170)
Saturation Ratios: 13 % (ref 10.4–31.8)
TIBC: 365 ug/dL (ref 250–450)
UIBC: 317 ug/dL (ref 148–442)

## 2023-02-12 LAB — FERRITIN: Ferritin: 55 ng/mL (ref 11–307)

## 2023-02-13 ENCOUNTER — Telehealth: Payer: Medicare Other | Admitting: Adult Health

## 2023-02-17 ENCOUNTER — Inpatient Hospital Stay (HOSPITAL_BASED_OUTPATIENT_CLINIC_OR_DEPARTMENT_OTHER): Payer: Medicare Other | Admitting: Adult Health

## 2023-02-17 ENCOUNTER — Encounter: Payer: Self-pay | Admitting: Adult Health

## 2023-02-17 DIAGNOSIS — Z853 Personal history of malignant neoplasm of breast: Secondary | ICD-10-CM

## 2023-02-17 DIAGNOSIS — D508 Other iron deficiency anemias: Secondary | ICD-10-CM | POA: Diagnosis not present

## 2023-02-17 DIAGNOSIS — E538 Deficiency of other specified B group vitamins: Secondary | ICD-10-CM

## 2023-02-17 NOTE — Assessment & Plan Note (Signed)
Her B12 level is below 300 which is indicative of an insufficiency.  I recommended that she get over-the-counter vitamin B12 1000 mcg sublingual and take daily.  We will recheck labs again in 12 weeks.

## 2023-02-17 NOTE — Assessment & Plan Note (Signed)
She has no clinical signs of breast cancer recurrence.  I recommended that she continue with annual mammograms next due in April 2025.

## 2023-02-17 NOTE — Progress Notes (Signed)
[facility] Cancer Follow up:    Kristi Nam, MD [address] [place] [address]   DIAGNOSIS:  Cancer Staging  History of left breast cancer Staging form: Breast, AJCC 7th Edition - Clinical: Stage IIA (T2, N0, cM0) - Unsigned Specimen type: Core Needle Biopsy Histopathologic type: 9931 Laterality: Left Staging comments: Staged at breast conference 08/11/13.  - Pathologic: No stage assigned - Unsigned Specimen type: Core Needle Biopsy Histopathologic type: 9931 Laterality: Left  I connected with Kristi Carr on 02/17/23 at  3:45 PM EDT by telephone and verified that I am speaking with the correct person using two identifiers.  I discussed the limitations, risks, security and privacy concerns of performing an evaluation and management service by telephone and the availability of in person appointments.  I also discussed with the patient that there may be a patient responsible charge related to this service. The patient expressed understanding and agreed to proceed.  Patient location: home Provider location: [facility] office   SUMMARY OF ONCOLOGIC/HEMATOLOGIC HISTORY: [place] woman status post left breast biopsy 08/02/2013 for a clinical T2 N0, stage IIA invasive papillary breast cancer, grade 1, estrogen receptor and progesterone receptor both 100% positive, with an MIB-1 of 9% and no HER-2 amplification   (1) Status post left lumpectomy and sentinel lymph node sampling 09/06/2013 for a pT1c pN0, stage IA invasive ductal carcinoma, grade 1, repeat HER-2 again negative   (2) Adjuvant radiation completed 11/30/2013   (3) started tamoxifen 12/18/2013, completing 5 years April 2020   (4)  anemia: as of 01/09/2018, ferritin was 16.2,, iron saturation, B12 and folate are normal as is the reticulocyte count and creatinine.              (a) REVIEW OF BLOOD FILM 01/09/2018 shows mild rouleaux, but no other abnormalities: Specifically there are no tailed  poikilocytes or significant anisocytosis, no left shift in the white cell series, and no artifactual platelet counts             (b) ferritin on 07/26/2019 was 3.5 (severe deficiency)             (c) iron sucrose started 08/18/2019, to be repeated x5, received 12/14/2019-01/11/2020, 06/14/2020-08/04/2020, 02/24/2021-03/12/2021             (d) GI workup:                        () colonoscopy 04/05/2019: diverticulosis                         () Diagnosed in 2021 with Gastric hemorrhage due to gastric antral vascular ectasia--sees Dr. Margaretha Glassing for radiofrequency ablation of the areas in question (last performed 11/2021)    CURRENT THERAPY: observation for left breast cancer; intermittent IV iron  INTERVAL HISTORY: Kristi Carr 77 y.o. female returns for f/u of her anemia and history of breast cancer.  Her most recent mammogram occurred on 08/22/2022 and was normal.  She had breast density C.  She has no breast concerns.  She underwent an ablation for GAVE in July, 2024.  She notes her stomach is feeling better since that time.  Her hemoglobin and iron studies have improved.      Latest Reference Range & Units 11/16/21 13:05 01/03/22 09:35 02/19/22 11:08 05/03/22 08:58 05/15/22 10:54 05/28/22 10:54 06/18/22 12:38 10/29/22 13:32 11/15/22 16:21 02/11/23 15:30  Hemoglobin 12.0 - 15.0 g/dL 60.4 (L) 54.0 (L)  10.3 (L) 8.1 Repeated and verified X2. (L) 7.6 (L) 9.8 (L) 9.9 (L) 8.4 (L) 9.0 (L) 10.6 (L)  (LL): Data is critically low (L): Data is abnormally low   Latest Reference Range & Units 01/01/18 11:40 10/02/19 11:42 02/11/23 15:30  Vitamin B12 180 - 914 pg/mL 252 508 265    Latest Reference Range & Units 01/01/18 11:44 08/06/18 10:45 12/15/18 09:45 07/26/19 08:59 08/18/19 12:52 09/16/19 14:18 10/02/19 11:42 10/06/19 14:17 10/26/19 12:46 11/15/19 10:10 02/17/20 11:26 05/11/20 11:23 06/02/20 13:02 06/14/20 09:46 07/07/20 13:19 08/04/20 12:37 10/05/20 10:59 12/07/20 11:11 02/15/21 11:09 02/22/21 14:48 06/08/21  10:23 08/20/21 10:18 05/03/22 08:58 05/15/22 10:55 05/28/22 10:55 06/18/22 12:39 10/29/22 13:32 11/15/22 16:21 02/11/23 15:31  Ferritin 11 - 307 ng/mL 23 19 16.2 3.5 (L) 23 213 70 100 27 15 114 23 22 16  46 185 54 34 33 12 198 201 3.2 (L) 104 126 86 5 (L) 102 55  (L): Data is abnormally low  Patient Active Problem List   Diagnosis Date Noted   B12 deficiency 02/17/2023   Connective tissue disease (HCC) 05/07/2022   Osteoarthritis 05/07/2022   Raynaud's disease 05/07/2022   Gastric antral vascular ectasia 08/20/2021   Lichen sclerosus of female genitalia 12/03/2019   Female bladder prolapse 12/03/2019   Osteopenia 12/03/2019   Diverticulosis 04/04/2019   Internal hemorrhoids 04/04/2019   Status post cholecystectomy 12/22/2018   History of adenomatous polyp of colon 09/16/2017   Sensorineural hearing loss (SNHL), bilateral 09/10/2017   IDA (iron deficiency anemia) 07/31/2017   Murmur 07/06/2017   Recurrent Clostridium difficile diarrhea 03/11/2017   Healthcare maintenance 12/15/2016   Vaginal pessary in situ 02/14/2016   Advance care planning 05/23/2015   History of left breast cancer 08/11/2013   Diarrhea 11/04/2012   Medicare annual wellness visit, subsequent 11/04/2012   Hypercholesteremia 02/20/2011   HTN (hypertension) 12/19/2010   Vitamin D deficiency 06/22/2008   RENAL CALCULUS 08/19/2003    is allergic to clindamycin/lincomycin.  MEDICAL HISTORY: Past Medical History:  Diagnosis Date   Anemia    Breast cancer of upper-outer quadrant of left female breast (HCC)    C. difficile diarrhea    Cataract    Colon polyps    Gallstones 08/23/2003   x 2   Gastric hemorrhage due to gastric antral vascular ectasia (GAVE)    s/p radiofrequency ablation    GAVE (gastric antral vascular ectasia)    Hot flashes    Hypertension    Kidney stones    Radiation 10/14/13-12/02/13   left breast 50.4 gray, lumpectomy cavity boosted to 63 gray   Wears glasses     SURGICAL  HISTORY: Past Surgical History:  Procedure Laterality Date   BREAST LUMPECTOMY WITH SENTINEL LYMPH NODE BIOPSY Left 09/06/2013   LEFT BREAST SEED GUIDED LUMPECTOMY WITH LEFT AXILLARY SENTINEL NODE BIOPSY (Left)   CATARACT EXTRACTION, BILATERAL Bilateral    right-07/31/22, left-08/14/22   CHOLECYSTECTOMY     COLONOSCOPY     PARTIAL HYSTERECTOMY  05/20/1978   ovaries left in   TONSILLECTOMY     TUBAL LIGATION     WISDOM TOOTH EXTRACTION      SOCIAL HISTORY: Social History   Socioeconomic History   Marital status: Married    Spouse name: Not on file   Number of children: 2   Years of education: Not on file   Highest education level: Not on file  Occupational History   Occupation: Retired from [employer] in 2008  Tobacco Use   Smoking status: Never  Smokeless tobacco: Never  Vaping Use   Vaping status: Never Used  Substance and Sexual Activity   Alcohol use: No   Drug use: No   Sexual activity: Yes  Other Topics Concern   Not on file  Social History Narrative   Married 1966   2 kids   4 grand children   Retired from Phot[employer]   Social Determinants of Health   Financial Resource Strain: Low Risk  (12/30/2022)   Overall Financial Resource Strain (CARDIA)    Difficulty of Paying Living Expenses: Not hard at all  Food Insecurity: No Food Insecurity (12/30/2022)   Hunger Vital Sign    Worried About Running Out of Food in the Last Year: Never true    Ran Out of Food in the Last Year: Never true  Transportation Needs: No Transportation Needs (12/30/2022)   PRAPARE - Administrator, Civil Service (Medical): No    Lack of Transportation (Non-Medical): No  Physical Activity: Insufficiently Active (12/30/2022)   Exercise Vital Sign    Days of Exercise per Week: 4 days    Minutes of Exercise per Session: 30 min  Stress: No Stress Concern Present (12/30/2022)   [employer] of Occupational Health - Occupational Stress Questionnaire    Feeling of Stress : Not  at all  Social Connections: Moderately Isolated (12/30/2022)   Social Connection and Isolation Panel [NHANES]    Frequency of Communication with Friends and Family: More than three times a week    Frequency of Social Gatherings with Friends and Family: More than three times a week    Attends Religious Services: Never    Database administrator or Organizations: No    Attends Banker Meetings: Never    Marital Status: Married  Catering manager Violence: Not At Risk (12/30/2022)   Humiliation, Afraid, Rape, and Kick questionnaire    Fear of Current or Ex-Partner: No    Emotionally Abused: No    Physically Abused: No    Sexually Abused: No    FAMILY HISTORY: Family History  Problem Relation Age of Onset   Diabetes Mother    Hyperlipidemia Mother    Hypertension Mother    Breast cancer Mother    Heart attack Father    Heart disease Father    Hypertension Sister    Diabetes Sister    Heart disease Brother        CHF  EF 10%   Hypertension Brother    Gout Brother    Depression Neg Hx    Alcohol abuse Neg Hx    Drug abuse Neg Hx    Colon cancer Neg Hx    Uterine cancer Neg Hx    Ovarian cancer Neg Hx    Prostate cancer Neg Hx     Review of Systems  Constitutional:  Negative for appetite change, chills, fatigue, fever and unexpected weight change.  HENT:   Negative for hearing loss, lump/mass and trouble swallowing.   Eyes:  Negative for eye problems and icterus.  Respiratory:  Negative for chest tightness, cough and shortness of breath.   Cardiovascular:  Negative for chest pain, leg swelling and palpitations.  Gastrointestinal:  Negative for abdominal distention, abdominal pain, constipation, diarrhea, nausea and vomiting.  Endocrine: Negative for hot flashes.  Genitourinary:  Negative for difficulty urinating.   Musculoskeletal:  Negative for arthralgias.  Skin:  Negative for itching and rash.  Neurological:  Negative for dizziness, extremity weakness,  headaches and numbness.  Hematological:  Negative for adenopathy. Does not bruise/bleed easily.  Psychiatric/Behavioral:  Negative for depression. The patient is not nervous/anxious.   All other systems reviewed and are negative.     PHYSICAL EXAMINATION  Patient sounds well, in no apparent distress, mood and behavior normal.  LABORATORY DATA:  CBC    Component Value Date/Time   WBC 5.0 02/11/2023 1530   WBC 3.3 (L) 05/03/2022 0858   RBC 3.72 (L) 02/11/2023 1530   RBC 3.73 (L) 02/11/2023 1530   HGB 10.6 (L) 02/11/2023 1530   HGB 10.0 (L) 08/01/2016 1016   HCT 33.5 (L) 02/11/2023 1530   HCT 30.1 (L) 08/01/2016 1016   PLT 261 02/11/2023 1530   PLT 239 08/01/2016 1016   MCV 89.8 02/11/2023 1530   MCV 90.2 08/01/2016 1016   MCH 28.4 02/11/2023 1530   MCHC 31.6 02/11/2023 1530   RDW 16.4 (H) 02/11/2023 1530   RDW 13.5 08/01/2016 1016   LYMPHSABS 0.9 02/11/2023 1530   LYMPHSABS 0.9 08/01/2016 1016   MONOABS 0.6 02/11/2023 1530   MONOABS 0.5 08/01/2016 1016   EOSABS 0.3 02/11/2023 1530   EOSABS 0.1 08/01/2016 1016   BASOSABS 0.0 02/11/2023 1530   BASOSABS 0.0 08/01/2016 1016    CMP     Component Value Date/Time   NA 142 02/11/2023 1530   NA 143 08/01/2016 1016   K 3.8 02/11/2023 1530   K 3.8 08/01/2016 1016   CL 104 02/11/2023 1530   CO2 32 02/11/2023 1530   CO2 29 08/01/2016 1016   GLUCOSE 91 02/11/2023 1530   GLUCOSE 77 08/01/2016 1016   BUN 16 02/11/2023 1530   BUN 13.6 08/01/2016 1016   CREATININE 0.82 02/11/2023 1530   CREATININE 0.8 08/01/2016 1016   CALCIUM 9.5 02/11/2023 1530   CALCIUM 9.5 08/01/2016 1016   PROT 7.2 02/11/2023 1530   PROT 7.0 08/01/2016 1016   ALBUMIN 4.2 02/11/2023 1530   ALBUMIN 3.6 08/01/2016 1016   AST 19 02/11/2023 1530   AST 18 08/01/2016 1016   ALT 11 02/11/2023 1530   ALT 13 08/01/2016 1016   ALKPHOS 59 02/11/2023 1530   ALKPHOS 45 08/01/2016 1016   BILITOT 0.6 02/11/2023 1530   BILITOT 0.44 08/01/2016 1016   GFRNONAA  >60 02/11/2023 1530   GFRAA >60 10/02/2019 0957   GFRAA >60 08/06/2018 1044         ASSESSMENT and THERAPY PLAN:   History of left breast cancer She has no clinical signs of breast cancer recurrence.  I recommended that she continue with annual mammograms next due in April 2025.  IDA (iron deficiency anemia) Secondary to GAVE.  Her iron levels are stable today.  I reviewed them with her in detail.  We will recheck her labs in 10 to 12 weeks and follow-up with her virtually to review the results.  She knows how to get in touch with me if needed earlier than that time.  B12 deficiency Her B12 level is below 300 which is indicative of an insufficiency.  I recommended that she get over-the-counter vitamin B12 1000 mcg sublingual and take daily.  We will recheck labs again in 12 weeks.  All questions were answered. The patient knows to call the clinic with any problems, questions or concerns. We can certainly see the patient much sooner if necessary.  Follow up instructions:    -Return to cancer center 10-12 weeks for lab recheck -Mammogram due in 08/2023  The patient was provided an opportunity to ask questions and all  were answered. The patient agreed with the plan and demonstrated an understanding of the instructions.   The patient was advised to call back or seek an in-person evaluation if the symptoms worsen or if the condition fails to improve as anticipated.   I provided 15 minutes of non face-to-face telephone visit time during this encounter, and > 50% was spent counseling as documented under my assessment & plan.  Lillard Anes, NP 02/17/23 4:12 PM Medical Oncology and Hematology [facility] [address] [place], [place] [address] Tel. [phone]    Fax. [phone]

## 2023-02-17 NOTE — Assessment & Plan Note (Signed)
Secondary to GAVE.  Her iron levels are stable today.  I reviewed them with her in detail.  We will recheck her labs in 10 to 12 weeks and follow-up with her virtually to review the results.  She knows how to get in touch with me if needed earlier than that time.

## 2023-02-19 ENCOUNTER — Ambulatory Visit (INDEPENDENT_AMBULATORY_CARE_PROVIDER_SITE_OTHER): Payer: Medicare Other

## 2023-02-19 DIAGNOSIS — Z23 Encounter for immunization: Secondary | ICD-10-CM

## 2023-02-20 ENCOUNTER — Telehealth: Payer: Self-pay | Admitting: Adult Health

## 2023-02-20 NOTE — Telephone Encounter (Signed)
Per 9/30 los patient is aware of scheduled appointment times/dates

## 2023-02-23 ENCOUNTER — Telehealth: Payer: Self-pay | Admitting: Family Medicine

## 2023-02-23 DIAGNOSIS — R911 Solitary pulmonary nodule: Secondary | ICD-10-CM

## 2023-02-23 NOTE — Telephone Encounter (Signed)
Please call patient.  Due for follow-up chest CT regarding lung nodules and she should get a call about scheduling.  Thanks.

## 2023-02-24 NOTE — Telephone Encounter (Signed)
Called patient and reviewed all information. Patient verbalized understanding. Will call if any further questions.  

## 2023-03-06 ENCOUNTER — Encounter: Payer: Self-pay | Admitting: Hematology and Oncology

## 2023-03-06 ENCOUNTER — Ambulatory Visit
Admission: RE | Admit: 2023-03-06 | Discharge: 2023-03-06 | Disposition: A | Discharge status: Home / Self Care | Payer: Medicare Other | Source: Ambulatory Visit | Attending: Family Medicine | Admitting: Family Medicine

## 2023-03-06 DIAGNOSIS — R911 Solitary pulmonary nodule: Secondary | ICD-10-CM

## 2023-03-16 ENCOUNTER — Other Ambulatory Visit: Payer: Self-pay | Admitting: Internal Medicine

## 2023-04-02 ENCOUNTER — Other Ambulatory Visit: Payer: Self-pay | Admitting: Family Medicine

## 2023-04-02 DIAGNOSIS — R911 Solitary pulmonary nodule: Secondary | ICD-10-CM

## 2023-04-09 ENCOUNTER — Encounter: Payer: Self-pay | Admitting: Pulmonary Disease

## 2023-04-21 ENCOUNTER — Encounter: Payer: Self-pay | Admitting: Hematology and Oncology

## 2023-04-21 ENCOUNTER — Inpatient Hospital Stay: Payer: Medicare Other | Attending: Hematology and Oncology

## 2023-04-21 ENCOUNTER — Other Ambulatory Visit: Payer: Self-pay

## 2023-04-21 DIAGNOSIS — Z853 Personal history of malignant neoplasm of breast: Secondary | ICD-10-CM | POA: Insufficient documentation

## 2023-04-21 DIAGNOSIS — D649 Anemia, unspecified: Secondary | ICD-10-CM

## 2023-04-21 DIAGNOSIS — D500 Iron deficiency anemia secondary to blood loss (chronic): Secondary | ICD-10-CM | POA: Diagnosis not present

## 2023-04-21 DIAGNOSIS — D509 Iron deficiency anemia, unspecified: Secondary | ICD-10-CM

## 2023-04-21 DIAGNOSIS — K31811 Angiodysplasia of stomach and duodenum with bleeding: Secondary | ICD-10-CM

## 2023-04-21 DIAGNOSIS — K922 Gastrointestinal hemorrhage, unspecified: Secondary | ICD-10-CM | POA: Diagnosis not present

## 2023-04-21 DIAGNOSIS — E538 Deficiency of other specified B group vitamins: Secondary | ICD-10-CM | POA: Insufficient documentation

## 2023-04-21 DIAGNOSIS — D508 Other iron deficiency anemias: Secondary | ICD-10-CM

## 2023-04-21 LAB — IRON AND IRON BINDING CAPACITY (CC-WL,HP ONLY)
Iron: 39 ug/dL (ref 28–170)
Saturation Ratios: 10 % — ABNORMAL LOW (ref 10.4–31.8)
TIBC: 392 ug/dL (ref 250–450)
UIBC: 353 ug/dL (ref 148–442)

## 2023-04-21 LAB — CMP (CANCER CENTER ONLY)
ALT: 8 U/L (ref 0–44)
AST: 15 U/L (ref 15–41)
Albumin: 4.2 g/dL (ref 3.5–5.0)
Alkaline Phosphatase: 56 U/L (ref 38–126)
Anion gap: 6 (ref 5–15)
BUN: 12 mg/dL (ref 8–23)
CO2: 32 mmol/L (ref 22–32)
Calcium: 9.7 mg/dL (ref 8.9–10.3)
Chloride: 103 mmol/L (ref 98–111)
Creatinine: 0.75 mg/dL (ref 0.44–1.00)
GFR, Estimated: >60 mL/min (ref >60)
Glucose, Bld: 87 mg/dL (ref 70–99)
Potassium: 3.7 mmol/L (ref 3.5–5.1)
Sodium: 141 mmol/L (ref 135–145)
Total Bilirubin: 0.8 mg/dL (ref <1.2)
Total Protein: 7 g/dL (ref 6.5–8.1)

## 2023-04-21 LAB — CBC WITH DIFFERENTIAL (CANCER CENTER ONLY)
Abs Immature Granulocytes: 0.01 10*3/uL (ref 0.00–0.07)
Basophils Absolute: 0 10*3/uL (ref 0.0–0.1)
Basophils Relative: 0 %
Eosinophils Absolute: 0.3 10*3/uL (ref 0.0–0.5)
Eosinophils Relative: 6 %
HCT: 34.4 % — ABNORMAL LOW (ref 36.0–46.0)
Hemoglobin: 10.8 g/dL — ABNORMAL LOW (ref 12.0–15.0)
Immature Granulocytes: 0 %
Lymphocytes Relative: 15 %
Lymphs Abs: 0.8 10*3/uL (ref 0.7–4.0)
MCH: 28.8 pg (ref 26.0–34.0)
MCHC: 31.4 g/dL (ref 30.0–36.0)
MCV: 91.7 fL (ref 80.0–100.0)
Monocytes Absolute: 0.6 10*3/uL (ref 0.1–1.0)
Monocytes Relative: 11 %
Neutro Abs: 3.6 10*3/uL (ref 1.7–7.7)
Neutrophils Relative %: 68 %
Platelet Count: 262 10*3/uL (ref 150–400)
RBC: 3.75 MIL/uL — ABNORMAL LOW (ref 3.87–5.11)
RDW: 14.4 % (ref 11.5–15.5)
WBC Count: 5.2 10*3/uL (ref 4.0–10.5)
nRBC: 0 % (ref 0.0–0.2)

## 2023-04-21 LAB — VITAMIN B12: Vitamin B-12: 883 pg/mL (ref 180–914)

## 2023-04-21 LAB — FERRITIN: Ferritin: 19 ng/mL (ref 11–307)

## 2023-04-25 ENCOUNTER — Inpatient Hospital Stay (HOSPITAL_BASED_OUTPATIENT_CLINIC_OR_DEPARTMENT_OTHER): Payer: Medicare Other | Admitting: Adult Health

## 2023-04-25 DIAGNOSIS — Z853 Personal history of malignant neoplasm of breast: Secondary | ICD-10-CM

## 2023-04-25 DIAGNOSIS — D509 Iron deficiency anemia, unspecified: Secondary | ICD-10-CM | POA: Diagnosis not present

## 2023-04-25 DIAGNOSIS — E538 Deficiency of other specified B group vitamins: Secondary | ICD-10-CM

## 2023-04-25 NOTE — Progress Notes (Unsigned)
[facility] Cancer Follow up:    Kristi Nam, MD [address] [place] [address]   DIAGNOSIS: Cancer Staging  History of left breast cancer Staging form: Breast, AJCC 7th Edition - Clinical: Stage IIA (T2, N0, cM0) - Unsigned Specimen type: Core Needle Biopsy Histopathologic type: 9931 Laterality: Left Staging comments: Staged at breast conference 08/11/13.  - Pathologic: No stage assigned - Unsigned Specimen type: Core Needle Biopsy Histopathologic type: 9931 Laterality: Left  I connected with @PTNAME @ on 04/25/23 at  2:15 PM EST by telephone and verified that I am speaking with the correct person using two identifiers.  I discussed the limitations, risks, security and privacy concerns of performing an evaluation and management service by telephone and the availability of in person appointments.  I also discussed with the patient that there may be a patient responsible charge related to this service. The patient expressed understanding and agreed to proceed.   SUMMARY OF ONCOLOGIC/HEMATOLOGIC HISTORY: [place] woman status post left breast biopsy 08/02/2013 for a clinical T2 N0, stage IIA invasive papillary breast cancer, grade 1, estrogen receptor and progesterone receptor both 100% positive, with an MIB-1 of 9% and no HER-2 amplification   (1) Status post left lumpectomy and sentinel lymph node sampling 09/06/2013 for a pT1c pN0, stage IA invasive ductal carcinoma, grade 1, repeat HER-2 again negative   (2) Adjuvant radiation completed 11/30/2013   (3) started tamoxifen 12/18/2013, completing 5 years April 2020   (4)  anemia: as of 01/09/2018, ferritin was 16.2,, iron saturation, B12 and folate are normal as is the reticulocyte count and creatinine.              (a) REVIEW OF BLOOD FILM 01/09/2018 shows mild rouleaux, but no other abnormalities: Specifically there are no tailed poikilocytes or significant anisocytosis, no left shift in the white  cell series, and no artifactual platelet counts             (b) ferritin on 07/26/2019 was 3.5 (severe deficiency)             (c) iron sucrose started 08/18/2019, to be repeated x5, received 12/14/2019-01/11/2020, 06/14/2020-08/04/2020, 02/24/2021-03/12/2021             (d) GI workup:                        () colonoscopy 04/05/2019: diverticulosis                         () Diagnosed in 2021 with Gastric hemorrhage due to gastric antral vascular ectasia--sees Dr. Margaretha Glassing for radiofrequency ablation of the areas in question (last performed 11/2021)     CURRENT THERAPY: intermittent IV iron, b12 supplementation  INTERVAL HISTORY: Kristi Carr 77 y.o. female returns for    Patient Active Problem List   Diagnosis Date Noted   B12 deficiency 02/17/2023   Connective tissue disease (HCC) 05/07/2022   Osteoarthritis 05/07/2022   Raynaud's disease 05/07/2022   Gastric antral vascular ectasia 08/20/2021   Lichen sclerosus of female genitalia 12/03/2019   Female bladder prolapse 12/03/2019   Osteopenia 12/03/2019   Diverticulosis 04/04/2019   Internal hemorrhoids 04/04/2019   Status post cholecystectomy 12/22/2018   History of adenomatous polyp of colon 09/16/2017   Sensorineural hearing loss (SNHL), bilateral 09/10/2017   IDA (iron deficiency anemia) 07/31/2017   Murmur 07/06/2017   Recurrent Clostridium difficile diarrhea 03/11/2017   Healthcare maintenance 12/15/2016  Vaginal pessary in situ 02/14/2016   Advance care planning 05/23/2015   History of left breast cancer 08/11/2013   Diarrhea 11/04/2012   Medicare annual wellness visit, subsequent 11/04/2012   Hypercholesteremia 02/20/2011   HTN (hypertension) 12/19/2010   Vitamin D deficiency 06/22/2008   RENAL CALCULUS 08/19/2003    is allergic to clindamycin/lincomycin.  MEDICAL HISTORY: Past Medical History:  Diagnosis Date   Anemia    Breast cancer of upper-outer quadrant of left female breast (HCC)    C. difficile diarrhea     Cataract    Colon polyps    Gallstones 08/23/2003   x 2   Gastric hemorrhage due to gastric antral vascular ectasia (GAVE)    s/p radiofrequency ablation    GAVE (gastric antral vascular ectasia)    Hot flashes    Hypertension    Kidney stones    Radiation 10/14/13-12/02/13   left breast 50.4 gray, lumpectomy cavity boosted to 63 gray   Wears glasses     SURGICAL HISTORY: Past Surgical History:  Procedure Laterality Date   BREAST LUMPECTOMY WITH SENTINEL LYMPH NODE BIOPSY Left 09/06/2013   LEFT BREAST SEED GUIDED LUMPECTOMY WITH LEFT AXILLARY SENTINEL NODE BIOPSY (Left)   CATARACT EXTRACTION, BILATERAL Bilateral    right-07/31/22, left-08/14/22   CHOLECYSTECTOMY     COLONOSCOPY     PARTIAL HYSTERECTOMY  05/20/1978   ovaries left in   TONSILLECTOMY     TUBAL LIGATION     WISDOM TOOTH EXTRACTION      SOCIAL HISTORY: Social History   Socioeconomic History   Marital status: Married    Spouse name: Not on file   Number of children: 2   Years of education: Not on file   Highest education level: Not on file  Occupational History   Occupation: Retired from [employer] in 2008  Tobacco Use   Smoking status: Never   Smokeless tobacco: Never  Vaping Use   Vaping status: Never Used  Substance and Sexual Activity   Alcohol use: No   Drug use: No   Sexual activity: Yes  Other Topics Concern   Not on file  Social History Narrative   Married 1966   2 kids   4 grand children   Retired from Phot[employer]   Social Determinants of Health   Financial Resource Strain: Low Risk  (12/30/2022)   Overall Financial Resource Strain (CARDIA)    Difficulty of Paying Living Expenses: Not hard at all  Food Insecurity: No Food Insecurity (12/30/2022)   Hunger Vital Sign    Worried About Running Out of Food in the Last Year: Never true    Ran Out of Food in the Last Year: Never true  Transportation Needs: No Transportation Needs (12/30/2022)   PRAPARE - Scientist, research (physical sciences) (Medical): No    Lack of Transportation (Non-Medical): No  Physical Activity: Insufficiently Active (12/30/2022)   Exercise Vital Sign    Days of Exercise per Week: 4 days    Minutes of Exercise per Session: 30 min  Stress: No Stress Concern Present (12/30/2022)   [employer] of Occupational Health - Occupational Stress Questionnaire    Feeling of Stress : Not at all  Social Connections: Moderately Isolated (12/30/2022)   Social Connection and Isolation Panel [NHANES]    Frequency of Communication with Friends and Family: More than three times a week    Frequency of Social Gatherings with Friends and Family: More than three times a week    Attends  Religious Services: Never    Active Member of Clubs or Organizations: No    Attends Banker Meetings: Never    Marital Status: Married  Catering manager Violence: Not At Risk (12/30/2022)   Humiliation, Afraid, Rape, and Kick questionnaire    Fear of Current or Ex-Partner: No    Emotionally Abused: No    Physically Abused: No    Sexually Abused: No    FAMILY HISTORY: Family History  Problem Relation Age of Onset   Diabetes Mother    Hyperlipidemia Mother    Hypertension Mother    Breast cancer Mother    Heart attack Father    Heart disease Father    Hypertension Sister    Diabetes Sister    Heart disease Brother        CHF  EF 10%   Hypertension Brother    Gout Brother    Depression Neg Hx    Alcohol abuse Neg Hx    Drug abuse Neg Hx    Colon cancer Neg Hx    Uterine cancer Neg Hx    Ovarian cancer Neg Hx    Prostate cancer Neg Hx     Review of Systems  Constitutional:  Negative for appetite change, chills, fatigue, fever and unexpected weight change.  HENT:   Negative for hearing loss, lump/mass and trouble swallowing.   Eyes:  Negative for eye problems and icterus.  Respiratory:  Negative for chest tightness, cough and shortness of breath.   Cardiovascular:  Negative for chest pain, leg  swelling and palpitations.  Gastrointestinal:  Negative for abdominal distention, abdominal pain, constipation, diarrhea, nausea and vomiting.  Endocrine: Negative for hot flashes.  Genitourinary:  Negative for difficulty urinating.   Musculoskeletal:  Negative for arthralgias.  Skin:  Negative for itching and rash.  Neurological:  Negative for dizziness, extremity weakness, headaches and numbness.  Hematological:  Negative for adenopathy. Does not bruise/bleed easily.  Psychiatric/Behavioral:  Negative for depression. The patient is not nervous/anxious.   All other systems reviewed and are negative.     PHYSICAL EXAMINATION   Patient sounds well.  In no apparent distress, mood and behavior are normal.    LABORATORY DATA:  CBC    Component Value Date/Time   WBC 5.2 04/21/2023 1313   WBC 3.3 (L) 05/03/2022 0858   RBC 3.75 (L) 04/21/2023 1313   HGB 10.8 (L) 04/21/2023 1313   HGB 10.0 (L) 08/01/2016 1016   HCT 34.4 (L) 04/21/2023 1313   HCT 30.1 (L) 08/01/2016 1016   PLT 262 04/21/2023 1313   PLT 239 08/01/2016 1016   MCV 91.7 04/21/2023 1313   MCV 90.2 08/01/2016 1016   MCH 28.8 04/21/2023 1313   MCHC 31.4 04/21/2023 1313   RDW 14.4 04/21/2023 1313   RDW 13.5 08/01/2016 1016   LYMPHSABS 0.8 04/21/2023 1313   LYMPHSABS 0.9 08/01/2016 1016   MONOABS 0.6 04/21/2023 1313   MONOABS 0.5 08/01/2016 1016   EOSABS 0.3 04/21/2023 1313   EOSABS 0.1 08/01/2016 1016   BASOSABS 0.0 04/21/2023 1313   BASOSABS 0.0 08/01/2016 1016    CMP     Component Value Date/Time   NA 141 04/21/2023 1313   NA 143 08/01/2016 1016   K 3.7 04/21/2023 1313   K 3.8 08/01/2016 1016   CL 103 04/21/2023 1313   CO2 32 04/21/2023 1313   CO2 29 08/01/2016 1016   GLUCOSE 87 04/21/2023 1313   GLUCOSE 77 08/01/2016 1016   BUN 12 04/21/2023 1313  BUN 13.6 08/01/2016 1016   CREATININE 0.75 04/21/2023 1313   CREATININE 0.8 08/01/2016 1016   CALCIUM 9.7 04/21/2023 1313   CALCIUM 9.5 08/01/2016 1016    PROT 7.0 04/21/2023 1313   PROT 7.0 08/01/2016 1016   ALBUMIN 4.2 04/21/2023 1313   ALBUMIN 3.6 08/01/2016 1016   AST 15 04/21/2023 1313   AST 18 08/01/2016 1016   ALT 8 04/21/2023 1313   ALT 13 08/01/2016 1016   ALKPHOS 56 04/21/2023 1313   ALKPHOS 45 08/01/2016 1016   BILITOT 0.8 04/21/2023 1313   BILITOT 0.44 08/01/2016 1016   GFRNONAA >60 04/21/2023 1313   GFRAA >60 10/02/2019 0957   GFRAA >60 08/06/2018 1044       PENDING LABS:   RADIOGRAPHIC STUDIES:  No results found.   PATHOLOGY:     ASSESSMENT and THERAPY PLAN:   No problem-specific Assessment & Plan notes found for this encounter.   No orders of the defined types were placed in this encounter.   All questions were answered. The patient knows to call the clinic with any problems, questions or concerns. We can certainly see the patient much sooner if necessary. This note was electronically signed. Noreene Filbert, NP 04/25/2023

## 2023-04-26 ENCOUNTER — Telehealth: Payer: Self-pay | Admitting: Hematology and Oncology

## 2023-04-26 NOTE — Telephone Encounter (Signed)
 Left patient a vm regarding upcoming appointment

## 2023-05-01 ENCOUNTER — Encounter: Payer: Self-pay | Admitting: Pulmonary Disease

## 2023-05-01 ENCOUNTER — Ambulatory Visit: Payer: Medicare Other | Admitting: Pulmonary Disease

## 2023-05-01 ENCOUNTER — Encounter: Payer: Self-pay | Admitting: Hematology and Oncology

## 2023-05-01 VITALS — BP 160/80 | HR 69 | Temp 97.3°F | Ht 65.0 in | Wt 114.8 lb

## 2023-05-01 DIAGNOSIS — Z853 Personal history of malignant neoplasm of breast: Secondary | ICD-10-CM

## 2023-05-01 DIAGNOSIS — K31819 Angiodysplasia of stomach and duodenum without bleeding: Secondary | ICD-10-CM

## 2023-05-01 DIAGNOSIS — R911 Solitary pulmonary nodule: Secondary | ICD-10-CM | POA: Diagnosis not present

## 2023-05-01 NOTE — Assessment & Plan Note (Addendum)
She has no clinical signs of breast cancer recurrence.   -Mammogram due in 08/2023 -F/u with Dr. Al Pimple in 12 weeks with labs beforehand for breast cancer history and iron/b12 deficiency

## 2023-05-01 NOTE — Patient Instructions (Signed)
VISIT SUMMARY:  Kristi Carr, a 77 year old patient, visited today for evaluation of a lung nodule and management of her gastric antral vascular ectasia (GAVE). The lung nodule was incidentally discovered last year and has shown slow growth. The patient also has a history of GAVE, which has required multiple ablations and iron supplementation. Recent labs show improved hemoglobin and iron levels.  YOUR PLAN:  -LUNG NODULE: A lung nodule is a small growth in the lung that can be benign or malignant. Given its small size and slow growth, it is likely benign. We will monitor it with a non-contrast CT scan in three months to assess the behavior of the nodule and determine in biopsy will be needed  -GASTRIC ANTRAL VASCULAR ECTASIA (GAVE): GAVE is a condition where the stomach lining has abnormal blood vessels that can cause bleeding. You have been managing this with iron supplements and ablations. Your recent labs show improved hemoglobin and iron levels, so continue with your current iron supplementation and we will keep monitoring your levels.  INSTRUCTIONS:  Please schedule a follow-up appointment after your CT scan in three months.

## 2023-05-01 NOTE — Assessment & Plan Note (Signed)
Vitamin B12 Deficiency Improved B12 levels since starting supplementation in September/October. -Continue current B12 supplementation. -Recheck B12 levels in 3 months.

## 2023-05-01 NOTE — Progress Notes (Signed)
Subjective:    Patient ID: Kristi Carr, female    DOB: September 01, 1945, 77 y.o.   MRN: [id number]  Patient Care Team: Joaquim Nam, MD as PCP - General (Family Medicine) Huel Cote, MD as Consulting Physician (Obstetrics and Gynecology) Glenford Peers, [place] (Optometry) Emelia Loron, MD as Consulting Physician (General Surgery) Antony Blackbird, MD as Consulting Physician (Radiation Oncology) Sharrell Ku, MD as Consulting Physician (Gastroenterology) Rachel Moulds, MD as Consulting Physician (Hematology and Oncology)  Chief Complaint  Patient presents with   Consult    Nodule. No SOB, wheezing or cough.    BACKGROUND: Patient is a 77 year old lifelong never smoker who presents for evaluation of a lung nodule she is kindly referred by Dr. Crawford Givens.  She has a history of GAVE and prior history of left breast cancer in 2015 (ER/PR positive no HER2 amplification).  Now with incidental nodule noted in the right upper lobe with mostly smooth margins and minimal increase in 6 months time.  Nodule does not meet criteria for PET characterization due to size.  Patient referred for discussion of next steps.  HPI Discussed the use of AI scribe software for clinical note transcription with the patient, who gave verbal consent to proceed.  History of Present Illness   Kristi Carr, a 77 year old never-smoker, presents for evaluation of a lung nodule. The patient does not endorse respiratory symptoms. The lung nodule was incidentally discovered during a scan performed for an unrelated issue. The patient has been aware of the nodule since last year, and it has shown slow growth over time. The patient has no known allergies.  In addition to the lung nodule, the patient has a history of GAVE (gastric antral vascular ectasia) diagnosed in 2021, which has required multiple ablations and iron supplementation due to associated bleeding. The patient reports feeling better after receiving  iron. The patient also mentions that her husband has dementia, but no further details are provided about her social situation.  The patient's past medical history also includes breast cancer, this was in 2015. She has been followed at the [facility] at [facility]. The patient's current medications and other relevant medical history are discussed.   Details of her breast cancer history: She was diagnosed on 02 August 2013 with a clinical T2 N0 stage IIa invasive papillary breast cancer grade 1, estrogen and progesterone receptor positive and no HER2 amplification.  She underwent left lumpectomy with sentinel lymph node sampling on 06 September 2013 tumor staged at 1A invasive ductal carcinoma grade 1 repeat HER2 again negative ER/PR positive.  Adjuvant radiation completed 30 November 2013.  Started tamoxifen 18 December 2013 completing 5 years of therapy in April 2020.    Review of Systems A 10 point review of systems was performed and it is as noted above otherwise negative.   Past Medical History:  Diagnosis Date   Anemia    Breast cancer of upper-outer quadrant of left female breast (HCC)    C. difficile diarrhea    Cataract    Colon polyps    Gallstones 08/23/2003   x 2   Gastric hemorrhage due to gastric antral vascular ectasia (GAVE)    s/p radiofrequency ablation    GAVE (gastric antral vascular ectasia)    Hot flashes    Hypertension    Kidney stones    Radiation 10/14/13-12/02/13   left breast 50.4 gray, lumpectomy cavity boosted to 63 gray   Wears glasses     Past Surgical History:  Procedure Laterality Date   BREAST LUMPECTOMY WITH SENTINEL LYMPH NODE BIOPSY Left 09/06/2013   LEFT BREAST SEED GUIDED LUMPECTOMY WITH LEFT AXILLARY SENTINEL NODE BIOPSY (Left)   CATARACT EXTRACTION, BILATERAL Bilateral    right-07/31/22, left-08/14/22   CHOLECYSTECTOMY     COLONOSCOPY     PARTIAL HYSTERECTOMY  05/20/1978   ovaries left in   TONSILLECTOMY     TUBAL LIGATION     WISDOM  TOOTH EXTRACTION      Patient Active Problem List   Diagnosis Date Noted   Lung nodule seen on imaging study 05/01/2023   B12 deficiency 02/17/2023   Connective tissue disease (HCC) 05/07/2022   Osteoarthritis 05/07/2022   Raynaud's disease 05/07/2022   Gastric antral vascular ectasia 08/20/2021   Lichen sclerosus of female genitalia 12/03/2019   Female bladder prolapse 12/03/2019   Osteopenia 12/03/2019   Diverticulosis 04/04/2019   Internal hemorrhoids 04/04/2019   Status post cholecystectomy 12/22/2018   History of adenomatous polyp of colon 09/16/2017   Sensorineural hearing loss (SNHL), bilateral 09/10/2017   IDA (iron deficiency anemia) 07/31/2017   Murmur 07/06/2017   Recurrent Clostridium difficile diarrhea 03/11/2017   Healthcare maintenance 12/15/2016   Vaginal pessary in situ 02/14/2016   Advance care planning 05/23/2015   History of left breast cancer 08/11/2013   Diarrhea 11/04/2012   Medicare annual wellness visit, subsequent 11/04/2012   Hypercholesteremia 02/20/2011   HTN (hypertension) 12/19/2010   Vitamin D deficiency 06/22/2008   RENAL CALCULUS 08/19/2003    Family History  Problem Relation Age of Onset   Diabetes Mother    Hyperlipidemia Mother    Hypertension Mother    Breast cancer Mother    Heart attack Father    Heart disease Father    Hypertension Sister    Diabetes Sister    Heart disease Brother        CHF  EF 10%   Hypertension Brother    Gout Brother    Depression Neg Hx    Alcohol abuse Neg Hx    Drug abuse Neg Hx    Colon cancer Neg Hx    Uterine cancer Neg Hx    Ovarian cancer Neg Hx    Prostate cancer Neg Hx     Social History   Tobacco Use   Smoking status: Never   Smokeless tobacco: Never  Substance Use Topics   Alcohol use: No    Allergies  Allergen Reactions   Clindamycin/Lincomycin Other (See Comments)    GI upset attributed to med.  Not an allergy.     Current Meds  Medication Sig   amLODipine (NORVASC)  2.5 MG tablet TAKE 1-2 TABLETS (2.5-5 MG TOTAL) BY MOUTH DAILY.   Cholecalciferol (VITAMIN D) 50 MCG (2000 UT) CAPS Take 2,000 Units by mouth daily.   dicyclomine (BENTYL) 10 MG capsule Take 1 capsule (10 mg total) by mouth as needed for spasms.   diphenoxylate-atropine (LOMOTIL) 2.5-0.025 MG tablet Take 1 tablet by mouth 4 (four) times daily as needed for diarrhea or loose stools.   metoprolol succinate (TOPROL-XL) 50 MG 24 hr tablet Take 1 tablet (50 mg total) by mouth daily. TAKE WITH OR IMMEDIATELY FOLLOWING A MEAL.   omeprazole (PRILOSEC) 40 MG capsule TAKE 1 CAPSULE (40 MG TOTAL) BY MOUTH DAILY.    Immunization History  Administered Date(s) Administered   Fluad Quad(high Dose 65+) 01/29/2022   Fluad Trivalent(High Dose 65+) 02/19/2023   Influenza Split 04/10/2013, 05/23/2015, 03/10/2017   Influenza Whole 04/10/2013   Influenza, High  Dose Seasonal PF 02/23/2018, 02/08/2019   Influenza,inj,Quad PF,6+ Mos 05/23/2015, 03/10/2017   Influenza,inj,quad, With Preservative 02/27/2021   Influenza-Unspecified 05/23/2015, 03/10/2017   PFIZER(Purple Top)SARS-COV-2 Vaccination 06/25/2019, 07/16/2019, 05/17/2020   Pneumococcal Conjugate-13 05/23/2015   Pneumococcal Polysaccharide-23 11/02/2012   Tdap 12/05/2010   Zoster Recombinant(Shingrix) 01/11/2019, 05/17/2019   Zoster, Live 06/22/2008, 01/11/2019, 05/17/2019        Objective:     BP (!) 160/80 (BP Location: Right Arm, Cuff Size: Normal)   Pulse 69   Temp (!) 97.3 F (36.3 C)   Ht 5\' 5"  (1.651 m)   Wt 114 lb 12.8 oz (52.1 kg)   SpO2 96%   BMI 19.10 kg/m   SpO2: 96 % O2 Device: None (Room air)  GENERAL: Thin well-developed woman, very well-groomed, fully ambulatory, no conversational dyspnea. HEAD: Normocephalic, atraumatic.  EYES: Pupils equal, round, reactive to light.  No scleral icterus.  MOUTH: Oral mucosa moist.  No thrush.  Pharynx clear. NECK: Supple. No thyromegaly. Trachea midline. No JVD.  No  adenopathy. PULMONARY: Good air entry bilaterally.  No adventitious sounds. CARDIOVASCULAR: S1 and S2. Regular rate and rhythm.  No rubs, murmurs or gallops heard. ABDOMEN: Benign. MUSCULOSKELETAL: No joint deformity, no clubbing, no edema.  NEUROLOGIC: No overt focal deficit, no gait disturbance, speech is fluent. SKIN: Intact,warm,dry. PSYCH:Mood and behavior normal.  Representative image from CT performed 06 March 2023 showing the right upper lobe nodule measured at 8 mm (arrow):     SPN Malignancy Risk Score ([facility]o): 32.9%  Assessment & Plan:     ICD-10-CM   1. Lung nodule seen on imaging study  R91.1 CT SUPER D CHEST WO [place] PILOT    2. Gastric antral vascular ectasia  K31.819     3. History of left breast cancer  Z85.3       Orders Placed This Encounter  Procedures   CT SUPER D CHEST WO [facility] PILOT    In 3 months    Standing Status:   Future    Expiration Date:   04/30/2024    Preferred imaging location?:   [facility]   Discussion:    Lung Nodule Incidental finding of an 8 mm lung nodule with mostly smooth margins. Differential diagnosis includes benign tumor, carcinoid tumor, granuloma, hamartoma or less likely, met from breast CA. No symptoms reported. PET scan not recommended due to nodule not meeting criteria for characterization. Discussed potential benign nature and slow-growing characteristics of the nodule, and the possibility of a biopsy if growth occurs.  - Order non-contrast CT scan in three months - Follow up after CT done  Gastric Antral Vascular Ectasia (GAVE) Diagnosed in 2021 with multiple ablations performed. Recent labs show improved hemoglobin and iron levels. Continues iron supplementation. - Continue iron supplementation - Monitor hemoglobin and iron levels  Prior History of Breast Cancer Diagnosed 2015, follows at [facility] - This issue adds complexity to her management  Follow-up - Schedule follow-up  appointment after CT scan in three months.      Advised if symptoms do not improve or worsen, to please contact office for sooner follow up or seek emergency care.    I spent 45 minutes of dedicated to the care of this patient on the date of this encounter to include pre-visit review of records, face-to-face time with the patient discussing conditions above, post visit ordering of testing, clinical documentation with the electronic health record, making appropriate referrals as documented, and communicating necessary findings to members of the  patients care team.   Gailen Shelter, MD Advanced Bronchoscopy PCC[facility]    *This note was dictated using voice recognition software/Dragon.  Despite best efforts to proofread, errors can occur which can change the meaning. Any transcriptional errors that result from this process are unintentional and may not be fully corrected at the time of dictation.

## 2023-05-01 NOTE — Assessment & Plan Note (Signed)
ron Deficiency Anemia Stable hemoglobin levels since last iron infusion in July. However, ferritin levels and iron saturation rate are slowly declining, indicating possible future decrease in hemoglobin. Patient reports less bleeding recently. -Continue current management and monitor symptoms. -Check iron studies and CBC in 3 months or sooner if symptoms of anemia return.

## 2023-05-22 ENCOUNTER — Encounter: Payer: Self-pay | Admitting: Family Medicine

## 2023-05-22 ENCOUNTER — Ambulatory Visit: Payer: Medicare Other | Admitting: Family Medicine

## 2023-05-22 VITALS — BP 142/70 | HR 62 | Temp 98.2°F | Ht 65.0 in | Wt 113.0 lb

## 2023-05-22 DIAGNOSIS — R109 Unspecified abdominal pain: Secondary | ICD-10-CM

## 2023-05-22 LAB — COMPREHENSIVE METABOLIC PANEL
ALT: 9 U/L (ref 0–35)
AST: 16 U/L (ref 0–37)
Albumin: 4.3 g/dL (ref 3.5–5.2)
Alkaline Phosphatase: 59 U/L (ref 39–117)
BUN: 16 mg/dL (ref 6–23)
CO2: 32 meq/L (ref 19–32)
Calcium: 9.4 mg/dL (ref 8.4–10.5)
Chloride: 102 meq/L (ref 96–112)
Creatinine, Ser: 0.68 mg/dL (ref 0.40–1.20)
GFR: 84.46 mL/min (ref >60.00)
Glucose, Bld: 91 mg/dL (ref 70–99)
Potassium: 4.1 meq/L (ref 3.5–5.1)
Sodium: 142 meq/L (ref 135–145)
Total Bilirubin: 0.7 mg/dL (ref 0.2–1.2)
Total Protein: 6.8 g/dL (ref 6.0–8.3)

## 2023-05-22 LAB — CBC WITH DIFFERENTIAL/PLATELET
Basophils Absolute: 0 10*3/uL (ref 0.0–0.1)
Basophils Relative: 0.4 % (ref 0.0–3.0)
Eosinophils Absolute: 0.2 10*3/uL (ref 0.0–0.7)
Eosinophils Relative: 4.7 % (ref 0.0–5.0)
HCT: 32.2 % — ABNORMAL LOW (ref 36.0–46.0)
Hemoglobin: 10.5 g/dL — ABNORMAL LOW (ref 12.0–15.0)
Lymphocytes Relative: 17.4 % (ref 12.0–46.0)
Lymphs Abs: 0.8 10*3/uL (ref 0.7–4.0)
MCHC: 32.5 g/dL (ref 30.0–36.0)
MCV: 88.7 fL (ref 78.0–100.0)
Monocytes Absolute: 0.6 10*3/uL (ref 0.1–1.0)
Monocytes Relative: 12.9 % — ABNORMAL HIGH (ref 3.0–12.0)
Neutro Abs: 3 10*3/uL (ref 1.4–7.7)
Neutrophils Relative %: 64.6 % (ref 43.0–77.0)
Platelets: 271 10*3/uL (ref 150.0–400.0)
RBC: 3.63 Mil/uL — ABNORMAL LOW (ref 3.87–5.11)
RDW: 14.8 % (ref 11.5–15.5)
WBC: 4.7 10*3/uL (ref 4.0–10.5)

## 2023-05-22 LAB — LIPASE: Lipase: 40 U/L (ref 11.0–59.0)

## 2023-05-22 NOTE — Patient Instructions (Signed)
 Go to the lab on the way out.   If you have mychart we'll likely use that to update you.    Take care.  Glad to see you. Let me check with GI in the meantime.  I would try taking dicyclomine  daily for the next week or so (taken in the AM or midmorning) and see if that makes a difference.   Let me know if you keep having symptoms.

## 2023-05-22 NOTE — Progress Notes (Signed)
 She is still caring for her husband who has memory loss.  She has help from a sitter.  I thanked her for her effort.  Had seen Dr. Federico with GI prev.   Had episodic and intermittent pain for to 1 hour at a time.  Sig pain during the episode.  Can go days w/o pain.  No change with meals.  She is already limiting greasy foods.  Central abd pain when present, just inferior to umbilicus.  No vomiting.  No nausea.  No black or bloody stools.  No pain now.  Dicyclomine  helps.  Episodes are usually midday, when present.  She is usually eating avocado toast for breakfast.   Diet is usually consistent.  She feels well when not having an episode.    Discussed previous vascular findings:  Unchanged appearance of the 6 mm outpouching from the right internal iliac artery. Given that there is an artery origin from the dome, this might represent either an infundibulum or aneurysm  I would not expect this to contribute to her recent symptoms, discussed.  Meds, vitals, and allergies reviewed.   ROS: Per HPI unless specifically indicated in ROS section   GEN: nad, alert and oriented HEENT: mucous membranes moist NECK: supple w/o LA CV: rrr.  no  PULM: ctab, no inc wob ABD: soft, +bs, nontender to palpation at time of exam. EXT: no edema SKIN: Well-perfused.

## 2023-05-25 DIAGNOSIS — R109 Unspecified abdominal pain: Secondary | ICD-10-CM | POA: Insufficient documentation

## 2023-05-25 NOTE — Assessment & Plan Note (Signed)
 Of unclear source.  At this point still okay for outpatient follow-up.  I will ask for GI input in the meantime. I would try taking dicyclomine  daily for the next week or so (taken in the AM or midmorning) and see if that makes a difference.  I asked her to let me know if she keeps having symptoms.  See notes on labs.

## 2023-06-08 ENCOUNTER — Other Ambulatory Visit: Payer: Self-pay | Admitting: Family Medicine

## 2023-06-10 ENCOUNTER — Encounter: Payer: Self-pay | Admitting: Internal Medicine

## 2023-06-10 ENCOUNTER — Ambulatory Visit: Payer: Medicare Other | Admitting: Internal Medicine

## 2023-06-10 VITALS — BP 160/80 | HR 76 | Ht 65.0 in | Wt 114.0 lb

## 2023-06-10 DIAGNOSIS — R197 Diarrhea, unspecified: Secondary | ICD-10-CM

## 2023-06-10 DIAGNOSIS — Z860100 Personal history of colon polyps, unspecified: Secondary | ICD-10-CM

## 2023-06-10 DIAGNOSIS — D649 Anemia, unspecified: Secondary | ICD-10-CM

## 2023-06-10 DIAGNOSIS — R1030 Lower abdominal pain, unspecified: Secondary | ICD-10-CM

## 2023-06-10 DIAGNOSIS — Z9049 Acquired absence of other specified parts of digestive tract: Secondary | ICD-10-CM

## 2023-06-10 DIAGNOSIS — K589 Irritable bowel syndrome without diarrhea: Secondary | ICD-10-CM | POA: Diagnosis not present

## 2023-06-10 DIAGNOSIS — Z8719 Personal history of other diseases of the digestive system: Secondary | ICD-10-CM

## 2023-06-10 MED ORDER — DIPHENOXYLATE-ATROPINE 2.5-0.025 MG PO TABS: 1.0000 | ORAL_TABLET | Freq: Four times a day (QID) | ORAL | 5 refills | Status: DC | PRN

## 2023-06-10 MED ORDER — DICYCLOMINE HCL 10 MG PO CAPS: 10.0000 mg | ORAL_CAPSULE | ORAL | 5 refills | Status: DC | PRN

## 2023-06-10 NOTE — Patient Instructions (Addendum)
We have sent the following medications to your pharmacy for you to pick up at your convenience: Dicyclomine,Lomotil  Follow up in 4 months If your blood pressure at your visit was 140/90 or greater, please contact your primary care physician to follow up on this.  _______________________________________________________  If you are age 77 or older, your body mass index should be between 23-30. Your Body mass index is 18.97 kg/m. If this is out of the aforementioned range listed, please consider follow up with your Primary Care Provider.  If you are age 58 or younger, your body mass index should be between 19-25. Your Body mass index is 18.97 kg/m. If this is out of the aformentioned range listed, please consider follow up with your Primary Care Provider.   ________________________________________________________  The [facility] providers would like to encourage you to use [facility] to communicate with providers for non-urgent requests or questions.  Due to long hold times on the telephone, sending your provider a message by [facility] may be a faster and more efficient way to get a response.  Please allow 48 business hours for a response.  Please remember that this is for non-urgent requests.  _______________________________________________________   Thank you for entrusting me with your care and for choosing [facility], Dr. Eulah Pont

## 2023-06-10 NOTE — Progress Notes (Signed)
Chief Complaint: Anemia  HPI : 78 year old female with history of GAVE, breast cancer s/p lumpectomy and radiation, osteoporosis, prior C dif infection presents for follow up of anemia and diarrhea  Interval History: Patient is actually doing quite well today.  She had several episodes of significant lower abdominal pain in 04/2023 (30 min episodes 3 days a week for the entire month), but only had a few episodes this month (3 total). If she took her dicyclomine, then she was fine.  Because her ab pain was mostly starting at 11 AM to the early afternoon, she was told by herh PCP Dr. Para March to take her dicyclomine before that time every day in order to try to reduce her risk of having this abdominal pain she was instructed to start taking dicyclomine in the morning.  This strategy seems to be working.  She is using the Lomotil only if she goes out to eat. If she eats, she will go to the bathroom. She is having on average having 3 BMs per day. Once in a while, she will have nocturnal stools. She is still getting G[facility] at [facility]. Her Hb is now up, which suggests that her treatments have been working.  Wt Readings from Last 3 Encounters:  06/10/23 114 lb (51.7 kg)  05/22/23 113 lb (51.3 kg)  05/01/23 114 lb 12.8 oz (52.1 kg)    Current Outpatient Medications  Medication Sig Dispense Refill   amLODipine (NORVASC) 2.5 MG tablet TAKE 1-2 TABLETS (2.5-5 MG TOTAL) BY MOUTH DAILY. 180 tablet 1   Cholecalciferol (VITAMIN D) 50 MCG (2000 UT) CAPS Take 2,000 Units by mouth daily.     cyanocobalamin (VITAMIN B12) 1000 MCG tablet Take 1,000 mcg by mouth 2 (two) times daily.     dicyclomine (BENTYL) 10 MG capsule Take 1 capsule (10 mg total) by mouth as needed for spasms. 30 capsule 5   diphenoxylate-atropine (LOMOTIL) 2.5-0.025 MG tablet Take 1 tablet by mouth 4 (four) times daily as needed for diarrhea or loose stools. 30 tablet 5   metoprolol succinate (TOPROL-XL) 50 MG 24 hr tablet  Take 1 tablet (50 mg total) by mouth daily. TAKE WITH OR IMMEDIATELY FOLLOWING A MEAL. 90 tablet 3   omeprazole (PRILOSEC) 40 MG capsule TAKE 1 CAPSULE (40 MG TOTAL) BY MOUTH DAILY. 90 capsule 0   No current facility-administered medications for this visit.   Physical Exam: BP (!) 160/80   Pulse 76   Ht 5\' 5"  (1.651 m)   Wt 114 lb (51.7 kg)   BMI 18.97 kg/m  Constitutional: Pleasant,well-developed, female in no acute distress. HEENT: Normocephalic and atraumatic. Conjunctivae are normal. No scleral icterus. Cardiovascular: Normal rate Pulmonary/chest: Effort normal Abdominal: Soft, nondistended, nontender. Bowel sounds active throughout. There are no masses palpable. No hepatomegaly. Extremities: No edema Neurological: Alert and oriented to person place and time. Skin: Skin is warm and dry. No rashes noted. Psychiatric: Normal mood and affect. Behavior is normal.  Labs 07/2021: CBC with low Hb of 11. BMP nml. FOBT positive.  Labs 08/2021: CBC with low Hb of 10.4. Ferritin 201.  Labs 10/2021: CBC with low Hb of 10.4.  Labs 05/2022: CBC with low Hb of 9.9. Ferritin 126. CMP unremarkable.  Labs 10/2022: CBC with low Hb of 9. Ferritin nml at 102. Iron sat elevated.   Labs 05/2023: CBC with low Hb of 10.5. CMP nml. Lipase nml.   TTE 07/23/17: Study Conclusions  - Left ventricle: The cavity size was  normal. There was mild focal    basal hypertrophy of the septum. Systolic function was normal.    The estimated ejection fraction was in the range of 60% to 65%.    Wall motion was normal; there were no regional wall motion    abnormalities. Doppler parameters are consistent with abnormal    left ventricular relaxation (grade 1 diastolic dysfunction).  - Mitral valve: There was mild regurgitation.  - Left atrium: The atrium was normal in size.  - Right ventricle: Systolic function was normal.  - Pulmonary arteries: Systolic pressure was within the normal    range.   CTA GI Bleed  07/18/21: IMPRESSION: VASCULAR 1. Visceral arteries in the abdomen and pelvis are widely patent. No evidence for acute or chronic mesenteric ischemia. 2. 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 Recommend follow-up CTA in 6-12 months to ensure stability. 3.  Aortic Atherosclerosis (ICD10-I70.0). NON-VASCULAR 1. No evidence for active GI bleeding. 2. Extensive colonic diverticulosis without acute inflammation. 3. Perinephric edema or stranding along the posteroinferior aspect of both kidneys. Findings are nonspecific and age-indeterminate. Probable cyst in the right kidney upper pole. 4. Cholecystectomy. Mild intrahepatic biliary dilatation versus mild periportal edema. 5. Dependent changes in both lung bases, likely related atelectasis. 6. Indeterminate 4 mm nodule in the right middle lobe. No follow-up needed if patient is low-risk. Non-contrast chest CT can be considered in 12 months if patient is high-risk. This recommendation follows the consensus statement: Guidelines for Management of Incidental Pulmonary Nodules Detected on CT Images: From the Fleischner Society 2017; Radiology 2017; 284:228-243.  CTA Pelvis 03/15/22: IMPRESSION: Unchanged appearance of the 6 mm outpouching from the right internal iliac artery. Given that there is an artery origin from the dome, this might represent either an infundibulum or aneurysm Aortic Atherosclerosis (ICD10-I70.0). Additional ancillary findings as above.  Colonoscopy 01/27/14: Left sided diverticulosis. Internal hemorrhoids. Recommend repeat in 5 years.  Colonoscopy 04/05/19: Diverticulosis of the sigmoid colon. Internal hemorrhoids. Recommended repeat colonoscopy in 5 years due to personal history of adenoma  EGD 08/08/20: Esophagus and duodenum were normal. 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   EGD 06/19/22: Impression  Linear gastric antral  vascular ectasia in the antrum; induced coagulation  and hemostasis achieved with radiofrequency ablation  The esophagus and duodenum appeared normal.   ASSESSMENT AND PLAN: Diarrhea Lower abdominal pain IBS History of cholecystectomy Anemia History of GAVE History of colon polyps Patient did have more severe abdominal pain symptoms in 04/2023, but this has improved since then by taking Bentyl preemptively every day prior to her the time that her abdominal pain typically occurs.  Patient was previously not able to tolerate the colestipol medication.  She is due for a colonoscopy for polyp surveillance, but she is hesitant to schedule this at this time.  Patient would also like to hold off on antibiotic therapy for IBS-D.  I suggested that she try IBgard and that the patient could consider trying the same probiotic that she was on in the past to see if this helps with her symptoms. - Previously gave information on low FODMAP diet - Cont Bentyl PRN for ab pain. Refill. - Cont Lomotil PRN for diarrhea. Refill. - Start IBGard. Will give samples today - Consider restarting VSL probiotic - Consider colonoscopy and antibiotics in the future. Patient declined to schedule colonoscopy today - RTC 4 months   Eulah Pont, MD  I spent  36 minutes of time, including in depth chart review, independent review of results as outlined above, communicating results with the patient directly, face-to-face time with the patient, coordinating care, and ordering studies and medications as appropriate, and documentation.

## 2023-06-12 ENCOUNTER — Other Ambulatory Visit: Payer: Self-pay | Admitting: Internal Medicine

## 2023-06-20 ENCOUNTER — Ambulatory Visit: Payer: Medicare Other | Admitting: Hematology and Oncology

## 2023-07-07 ENCOUNTER — Encounter: Payer: Self-pay | Admitting: Internal Medicine

## 2023-07-16 ENCOUNTER — Inpatient Hospital Stay: Payer: Medicare Other | Attending: Hematology and Oncology

## 2023-07-16 DIAGNOSIS — M8580 Other specified disorders of bone density and structure, unspecified site: Secondary | ICD-10-CM | POA: Insufficient documentation

## 2023-07-16 DIAGNOSIS — D509 Iron deficiency anemia, unspecified: Secondary | ICD-10-CM | POA: Insufficient documentation

## 2023-07-16 DIAGNOSIS — Z08 Encounter for follow-up examination after completed treatment for malignant neoplasm: Secondary | ICD-10-CM | POA: Insufficient documentation

## 2023-07-16 DIAGNOSIS — Z853 Personal history of malignant neoplasm of breast: Secondary | ICD-10-CM | POA: Insufficient documentation

## 2023-07-16 DIAGNOSIS — E538 Deficiency of other specified B group vitamins: Secondary | ICD-10-CM

## 2023-07-16 LAB — CBC WITH DIFFERENTIAL (CANCER CENTER ONLY)
Abs Immature Granulocytes: 0.01 10*3/uL (ref 0.00–0.07)
Basophils Absolute: 0 10*3/uL (ref 0.0–0.1)
Basophils Relative: 0 %
Eosinophils Absolute: 0.2 10*3/uL (ref 0.0–0.5)
Eosinophils Relative: 4 %
HCT: 29.5 % — ABNORMAL LOW (ref 36.0–46.0)
Hemoglobin: 8.8 g/dL — ABNORMAL LOW (ref 12.0–15.0)
Immature Granulocytes: 0 %
Lymphocytes Relative: 14 %
Lymphs Abs: 0.7 10*3/uL (ref 0.7–4.0)
MCH: 25.6 pg — ABNORMAL LOW (ref 26.0–34.0)
MCHC: 29.8 g/dL — ABNORMAL LOW (ref 30.0–36.0)
MCV: 85.8 fL (ref 80.0–100.0)
Monocytes Absolute: 0.7 10*3/uL (ref 0.1–1.0)
Monocytes Relative: 12 %
Neutro Abs: 3.8 10*3/uL (ref 1.7–7.7)
Neutrophils Relative %: 70 %
Platelet Count: 284 10*3/uL (ref 150–400)
RBC: 3.44 MIL/uL — ABNORMAL LOW (ref 3.87–5.11)
RDW: 14.3 % (ref 11.5–15.5)
WBC Count: 5.4 10*3/uL (ref 4.0–10.5)
nRBC: 0 % (ref 0.0–0.2)

## 2023-07-16 LAB — CMP (CANCER CENTER ONLY)
ALT: 8 U/L (ref 0–44)
AST: 15 U/L (ref 15–41)
Albumin: 4 g/dL (ref 3.5–5.0)
Alkaline Phosphatase: 64 U/L (ref 38–126)
Anion gap: 5 (ref 5–15)
BUN: 18 mg/dL (ref 8–23)
CO2: 30 mmol/L (ref 22–32)
Calcium: 9.2 mg/dL (ref 8.9–10.3)
Chloride: 105 mmol/L (ref 98–111)
Creatinine: 0.75 mg/dL (ref 0.44–1.00)
GFR, Estimated: >60 mL/min (ref >60)
Glucose, Bld: 91 mg/dL (ref 70–99)
Potassium: 3.7 mmol/L (ref 3.5–5.1)
Sodium: 140 mmol/L (ref 135–145)
Total Bilirubin: 0.6 mg/dL (ref 0.0–1.2)
Total Protein: 6.8 g/dL (ref 6.5–8.1)

## 2023-07-16 LAB — RETIC PANEL
Immature Retic Fract: 15.4 % (ref 2.3–15.9)
RBC.: 3.33 MIL/uL — ABNORMAL LOW (ref 3.87–5.11)
Retic Count, Absolute: 41 10*3/uL (ref 19.0–186.0)
Retic Ct Pct: 1.2 % (ref 0.4–3.1)
Reticulocyte Hemoglobin: 24.9 pg — ABNORMAL LOW (ref >27.9)

## 2023-07-16 LAB — VITAMIN B12: Vitamin B-12: 626 pg/mL (ref 180–914)

## 2023-07-16 LAB — IRON AND IRON BINDING CAPACITY (CC-WL,HP ONLY)
Iron: 19 ug/dL — ABNORMAL LOW (ref 28–170)
Saturation Ratios: 4 % — ABNORMAL LOW (ref 10.4–31.8)
TIBC: 470 ug/dL — ABNORMAL HIGH (ref 250–450)
UIBC: 451 ug/dL — ABNORMAL HIGH (ref 148–442)

## 2023-07-16 LAB — FERRITIN: Ferritin: 4 ng/mL — ABNORMAL LOW (ref 11–307)

## 2023-07-18 ENCOUNTER — Inpatient Hospital Stay: Payer: Medicare Other | Admitting: Hematology and Oncology

## 2023-07-18 VITALS — BP 172/80 | HR 76 | Temp 97.2°F | Resp 16 | Wt 116.9 lb

## 2023-07-18 DIAGNOSIS — D509 Iron deficiency anemia, unspecified: Secondary | ICD-10-CM

## 2023-07-18 DIAGNOSIS — Z08 Encounter for follow-up examination after completed treatment for malignant neoplasm: Secondary | ICD-10-CM | POA: Diagnosis not present

## 2023-07-18 NOTE — Progress Notes (Signed)
 [facility] Cancer Follow up:    Joaquim Nam, MD [address] [place] [address]   DIAGNOSIS:  Cancer Staging  History of left breast cancer Staging form: Breast, AJCC 7th Edition - Clinical: Stage IIA (T2, N0, cM0) - Unsigned Specimen type: Core Needle Biopsy Histopathologic type: 9931 Laterality: Left Staging comments: Staged at breast conference 08/11/13.  - Pathologic: No stage assigned - Unsigned Specimen type: Core Needle Biopsy Histopathologic type: 9931 Laterality: Left    SUMMARY OF ONCOLOGIC/HEMATOLOGIC HISTORY: [place] woman status post left breast biopsy 08/02/2013 for a clinical T2 N0, stage IIA invasive papillary breast cancer, grade 1, estrogen receptor and progesterone receptor both 100% positive, with an MIB-1 of 9% and no HER-2 amplification   (1) Status post left lumpectomy and sentinel lymph node sampling 09/06/2013 for a pT1c pN0, stage IA invasive ductal carcinoma, grade 1, repeat HER-2 again negative   (2) Adjuvant radiation completed 11/30/2013   (3) started tamoxifen 12/18/2013, completing 5 years April 2020   (4)  anemia: as of 01/09/2018, ferritin was 16.2,, iron saturation, B12 and folate are normal as is the reticulocyte count and creatinine.              (a) REVIEW OF BLOOD FILM 01/09/2018 shows mild rouleaux, but no other abnormalities: Specifically there are no tailed poikilocytes or significant anisocytosis, no left shift in the white cell series, and no artifactual platelet counts             (b) ferritin on 07/26/2019 was 3.5 (severe deficiency)             (c) iron sucrose started 08/18/2019, to be repeated x5, received 12/14/2019-01/11/2020, 06/14/2020-08/04/2020, 02/24/2021-03/12/2021             (d) GI workup:                        () colonoscopy 04/05/2019: diverticulosis                         () Diagnosed in 2021 with Gastric hemorrhage due to gastric antral vascular ectasia--sees Dr. Margaretha Glassing for  radiofrequency ablation of the areas in question (last performed 11/2021)     CURRENT THERAPY: intermittent IV iron, b12 supplementation  INTERVAL HISTORY:  Discussed the use of AI scribe software for clinical note transcription with the patient, who gave verbal consent to proceed.  Kristi Carr is a 78 year old female with anemia who presents for follow-up of her hemoglobin and iron levels.  She has a history of anemia with a current hemoglobin level of 8.8, decreased from 10.5 in January. Ferritin is low at 4, indicating decreased iron stores. She has been taking B12 supplements regularly. She last received iron treatment in July of the previous year.  She feels generally well since her last visit, with no episodes of bleeding. No bright red blood or black stools, although she finds it difficult to differentiate due to her iron supplementation, which can cause black stools. She experiences weakness in her legs when walking long distances or climbing stairs, which she associates with her low hemoglobin levels. No cravings for ice or cold substances, and no brittle nails or difficulty swallowing.  She has a history of breast cancer on the left side and has not noticed any changes related to this. She uses castor oil to manage itching in the area of previous treatment and has a mammogram  scheduled for April.  She mentions having a sitter for her husband, which she needs to coordinate with her medical appointments.    Patient Active Problem List   Diagnosis Date Noted   Abdominal pain 05/25/2023   Lung nodule seen on imaging study 05/01/2023   B12 deficiency 02/17/2023   Connective tissue disease (HCC) 05/07/2022   Osteoarthritis 05/07/2022   Raynaud's disease 05/07/2022   Gastric antral vascular ectasia 08/20/2021   Lichen sclerosus of female genitalia 12/03/2019   Female bladder prolapse 12/03/2019   Osteopenia 12/03/2019   Diverticulosis 04/04/2019   Internal hemorrhoids 04/04/2019    Status post cholecystectomy 12/22/2018   History of adenomatous polyp of colon 09/16/2017   Sensorineural hearing loss (SNHL), bilateral 09/10/2017   IDA (iron deficiency anemia) 07/31/2017   Murmur 07/06/2017   Recurrent Clostridium difficile diarrhea 03/11/2017   Healthcare maintenance 12/15/2016   Vaginal pessary in situ 02/14/2016   Advance care planning 05/23/2015   History of left breast cancer 08/11/2013   Diarrhea 11/04/2012   Medicare annual wellness visit, subsequent 11/04/2012   Hypercholesteremia 02/20/2011   HTN (hypertension) 12/19/2010   Vitamin D deficiency 06/22/2008   RENAL CALCULUS 08/19/2003    is allergic to clindamycin/lincomycin.  MEDICAL HISTORY: Past Medical History:  Diagnosis Date   Anemia    Breast cancer of upper-outer quadrant of left female breast (HCC)    C. difficile diarrhea    Cataract    Colon polyps    Gallstones 08/23/2003   x 2   Gastric hemorrhage due to gastric antral vascular ectasia (GAVE)    s/p radiofrequency ablation    GAVE (gastric antral vascular ectasia)    Hot flashes    Hypertension    Kidney stones    Radiation 10/14/13-12/02/13   left breast 50.4 gray, lumpectomy cavity boosted to 63 gray   Wears glasses     SURGICAL HISTORY: Past Surgical History:  Procedure Laterality Date   BREAST LUMPECTOMY WITH SENTINEL LYMPH NODE BIOPSY Left 09/06/2013   LEFT BREAST SEED GUIDED LUMPECTOMY WITH LEFT AXILLARY SENTINEL NODE BIOPSY (Left)   CATARACT EXTRACTION, BILATERAL Bilateral    right-07/31/22, left-08/14/22   CHOLECYSTECTOMY     COLONOSCOPY     PARTIAL HYSTERECTOMY  05/20/1978   ovaries left in   TONSILLECTOMY     TUBAL LIGATION     WISDOM TOOTH EXTRACTION      SOCIAL HISTORY: Social History   Socioeconomic History   Marital status: Married    Spouse name: Not on file   Number of children: 2   Years of education: Not on file   Highest education level: Not on file  Occupational History   Occupation: Retired  from [employer] in 2008  Tobacco Use   Smoking status: Never   Smokeless tobacco: Never  Vaping Use   Vaping status: Never Used  Substance and Sexual Activity   Alcohol use: No   Drug use: No   Sexual activity: Yes  Other Topics Concern   Not on file  Social History Narrative   Married 1966   2 kids   4 grand children   Retired from [employer]   Social Drivers of Corporate investment banker Strain: Low Risk  (12/30/2022)   Overall Financial Resource Strain (CARDIA)    Difficulty of Paying Living Expenses: Not hard at all  Food Insecurity: No Food Insecurity (12/30/2022)   Hunger Vital Sign    Worried About Running Out of Food in the Last Year: Never true  Ran Out of Food in the Last Year: Never true  Transportation Needs: No Transportation Needs (12/30/2022)   PRAPARE - Administrator, Civil Service (Medical): No    Lack of Transportation (Non-Medical): No  Physical Activity: Insufficiently Active (12/30/2022)   Exercise Vital Sign    Days of Exercise per Week: 4 days    Minutes of Exercise per Session: 30 min  Stress: No Stress Concern Present (12/30/2022)   [employer] of Occupational Health - Occupational Stress Questionnaire    Feeling of Stress : Not at all  Social Connections: Moderately Isolated (12/30/2022)   Social Connection and Isolation Panel [NHANES]    Frequency of Communication with Friends and Family: More than three times a week    Frequency of Social Gatherings with Friends and Family: More than three times a week    Attends Religious Services: Never    Database administrator or Organizations: No    Attends Banker Meetings: Never    Marital Status: Married  Catering manager Violence: Not At Risk (12/30/2022)   Humiliation, Afraid, Rape, and Kick questionnaire    Fear of Current or Ex-Partner: No    Emotionally Abused: No    Physically Abused: No    Sexually Abused: No    FAMILY HISTORY: Family History  Problem  Relation Age of Onset   Diabetes Mother    Hyperlipidemia Mother    Hypertension Mother    Breast cancer Mother    Heart attack Father    Heart disease Father    Hypertension Sister    Diabetes Sister    Heart disease Brother        CHF  EF 10%   Hypertension Brother    Gout Brother    Depression Neg Hx    Alcohol abuse Neg Hx    Drug abuse Neg Hx    Colon cancer Neg Hx    Uterine cancer Neg Hx    Ovarian cancer Neg Hx    Prostate cancer Neg Hx     Review of Systems  Constitutional:  Negative for appetite change, chills, fatigue, fever and unexpected weight change.  HENT:   Negative for hearing loss, lump/mass and trouble swallowing.   Eyes:  Negative for eye problems and icterus.  Respiratory:  Negative for chest tightness, cough and shortness of breath.   Cardiovascular:  Negative for chest pain, leg swelling and palpitations.  Gastrointestinal:  Negative for abdominal distention, abdominal pain, constipation, diarrhea, nausea and vomiting.  Endocrine: Negative for hot flashes.  Genitourinary:  Negative for difficulty urinating.   Musculoskeletal:  Negative for arthralgias.  Skin:  Negative for itching and rash.  Neurological:  Negative for dizziness, extremity weakness, headaches and numbness.  Hematological:  Negative for adenopathy. Does not bruise/bleed easily.  Psychiatric/Behavioral:  Negative for depression. The patient is not nervous/anxious.   All other systems reviewed and are negative.     PHYSICAL EXAMINATION   General appearance: Alert, oriented in no acute distess Bilateral breast examined, left breast with posttreatment changes.  No palpable masses or regional adenopathy Heart: RRR Abdomen: Soft, non tender, non distended, no organomegaly. No LE edema  LABORATORY DATA:  CBC    Component Value Date/Time   WBC 5.4 07/16/2023 1303   WBC 4.7 05/22/2023 1248   RBC 3.33 (L) 07/16/2023 1303   RBC 3.44 (L) 07/16/2023 1303   HGB 8.8 (L) 07/16/2023  1303   HGB 10.0 (L) 08/01/2016 1016   HCT 29.5 (L)  07/16/2023 1303   HCT 30.1 (L) 08/01/2016 1016   PLT 284 07/16/2023 1303   PLT 239 08/01/2016 1016   MCV 85.8 07/16/2023 1303   MCV 90.2 08/01/2016 1016   MCH 25.6 (L) 07/16/2023 1303   MCHC 29.8 (L) 07/16/2023 1303   RDW 14.3 07/16/2023 1303   RDW 13.5 08/01/2016 1016   LYMPHSABS 0.7 07/16/2023 1303   LYMPHSABS 0.9 08/01/2016 1016   MONOABS 0.7 07/16/2023 1303   MONOABS 0.5 08/01/2016 1016   EOSABS 0.2 07/16/2023 1303   EOSABS 0.1 08/01/2016 1016   BASOSABS 0.0 07/16/2023 1303   BASOSABS 0.0 08/01/2016 1016    CMP     Component Value Date/Time   NA 140 07/16/2023 1303   NA 143 08/01/2016 1016   K 3.7 07/16/2023 1303   K 3.8 08/01/2016 1016   CL 105 07/16/2023 1303   CO2 30 07/16/2023 1303   CO2 29 08/01/2016 1016   GLUCOSE 91 07/16/2023 1303   GLUCOSE 77 08/01/2016 1016   BUN 18 07/16/2023 1303   BUN 13.6 08/01/2016 1016   CREATININE 0.75 07/16/2023 1303   CREATININE 0.8 08/01/2016 1016   CALCIUM 9.2 07/16/2023 1303   CALCIUM 9.5 08/01/2016 1016   PROT 6.8 07/16/2023 1303   PROT 7.0 08/01/2016 1016   ALBUMIN 4.0 07/16/2023 1303   ALBUMIN 3.6 08/01/2016 1016   AST 15 07/16/2023 1303   AST 18 08/01/2016 1016   ALT 8 07/16/2023 1303   ALT 13 08/01/2016 1016   ALKPHOS 64 07/16/2023 1303   ALKPHOS 45 08/01/2016 1016   BILITOT 0.6 07/16/2023 1303   BILITOT 0.44 08/01/2016 1016   GFRNONAA >60 07/16/2023 1303   GFRAA >60 10/02/2019 0957   GFRAA >60 08/06/2018 1044   ASSESSMENT and THERAPY PLAN:   This is a very pleasant 78 year old female patient with history of left breast cancer status postlumpectomy radiation and antiestrogen therapy, also has iron deficiency from recurrent GI bleed secondary to GAVE who is here for follow-up.   Iron Deficiency Anemia Hemoglobin decreased from 10.5 in January to 8.8 today. Ferritin is 4. No overt bleeding noted. Patient is on iron supplementation but stool color is difficult  to interpret due to concurrent iron use. -Administer three infusions of the venofer previously tolerated by the patient. -Check hemoglobin and iron levels in three months.  Breast Cancer Surveillance No changes noted on self-examination. Mammogram scheduled for April. -Continue current surveillance plan.  General Health Maintenance -Continue B12 supplementation. -Consider consultation with gastroenterologist for possible ablation after iron infusions are completed.

## 2023-07-21 ENCOUNTER — Telehealth: Payer: Self-pay | Admitting: Hematology and Oncology

## 2023-07-21 NOTE — Telephone Encounter (Signed)
 Pt left VM stating she would like to reschedule her iron appt to this coming Saturday.  She stated on the phone as well that she had a bleeding episode on this past Saturday and feels like she would benefit with getting her iron infusion earlier then scheduled.  This RN attempted to call the patient x 2 with no answer - noted pt has communicated with the scheduling staff is is scheduled for iron this coming Saturday.  Note this RN was hoping to speak with pt to inquire further for any additional needs.

## 2023-07-25 MED FILL — Iron Sucrose Inj 20 MG/ML (Fe Equiv): INTRAVENOUS | Qty: 15 | Status: AC

## 2023-07-26 ENCOUNTER — Inpatient Hospital Stay: Attending: Hematology and Oncology

## 2023-07-26 VITALS — BP 184/83 | HR 66 | Temp 97.7°F | Resp 16

## 2023-07-26 DIAGNOSIS — D508 Other iron deficiency anemias: Secondary | ICD-10-CM

## 2023-07-26 MED ORDER — SODIUM CHLORIDE 0.9 % IV SOLN
300.0000 mg | Freq: Once | INTRAVENOUS | Status: AC
  Administered 2023-07-26: 300 mg via INTRAVENOUS
  Filled 2023-07-26: qty 300

## 2023-07-26 MED ORDER — SODIUM CHLORIDE 0.9 % IV SOLN
INTRAVENOUS | Status: DC
Start: 2023-07-26 — End: 2023-07-26

## 2023-07-26 NOTE — Patient Instructions (Signed)
Iron Sucrose Injection What is this medication? IRON SUCROSE (EYE ern SOO krose) treats low levels of iron (iron deficiency anemia) in people with kidney disease. Iron is a mineral that plays an important role in making red blood cells, which carry oxygen from your lungs to the rest of your body. This medicine may be used for other purposes; ask your health care provider or pharmacist if you have questions. COMMON BRAND NAME(S): Venofer What should I tell my care team before I take this medication? They need to know if you have any of these conditions: Anemia not caused by low iron levels Heart disease High levels of iron in the blood Kidney disease Liver disease An unusual or allergic reaction to iron, other medications, foods, dyes, or preservatives Pregnant or trying to get pregnant Breastfeeding How should I use this medication? This medication is for infusion into a vein. It is given in a hospital or clinic setting. Talk to your care team about the use of this medication in children. While this medication may be prescribed for children as young as 2 years for selected conditions, precautions do apply. Overdosage: If you think you have taken too much of this medicine contact a poison control center or emergency room at once. NOTE: This medicine is only for you. Do not share this medicine with others. What if I miss a dose? Keep appointments for follow-up doses. It is important not to miss your dose. Call your care team if you are unable to keep an appointment. What may interact with this medication? Do not take this medication with any of the following: Deferoxamine Dimercaprol Other iron products This medication may also interact with the following: Chloramphenicol Deferasirox This list may not describe all possible interactions. Give your health care provider a list of all the medicines, herbs, non-prescription drugs, or dietary supplements you use. Also tell them if you smoke,  drink alcohol, or use illegal drugs. Some items may interact with your medicine. What should I watch for while using this medication? Visit your care team regularly. Tell your care team if your symptoms do not start to get better or if they get worse. You may need blood work done while you are taking this medication. You may need to follow a special diet. Talk to your care team. Foods that contain iron include: whole grains/cereals, dried fruits, beans, or peas, leafy green vegetables, and organ meats (liver, kidney). What side effects may I notice from receiving this medication? Side effects that you should report to your care team as soon as possible: Allergic reactions--skin rash, itching, hives, swelling of the face, lips, tongue, or throat Low blood pressure--dizziness, feeling faint or lightheaded, blurry vision Shortness of breath Side effects that usually do not require medical attention (report to your care team if they continue or are bothersome): Flushing Headache Joint pain Muscle pain Nausea Pain, redness, or irritation at injection site This list may not describe all possible side effects. Call your doctor for medical advice about side effects. You may report side effects to FDA at 1-800-FDA-1088. Where should I keep my medication? This medication is given in a hospital or clinic and will not be stored at home. NOTE: This sheet is a summary. It may not cover all possible information. If you have questions about this medicine, talk to your doctor, pharmacist, or health care provider.  2024 Elsevier/Gold Standard (2021-11-14 00:00:00)  

## 2023-07-28 ENCOUNTER — Telehealth: Payer: Self-pay | Admitting: Hematology and Oncology

## 2023-07-31 ENCOUNTER — Other Ambulatory Visit: Payer: Medicare Other

## 2023-08-01 MED FILL — Iron Sucrose Inj 20 MG/ML (Fe Equiv): INTRAVENOUS | Qty: 15 | Status: AC

## 2023-08-02 ENCOUNTER — Inpatient Hospital Stay

## 2023-08-02 VITALS — BP 168/73 | HR 62 | Temp 98.2°F | Resp 17

## 2023-08-02 DIAGNOSIS — D508 Other iron deficiency anemias: Secondary | ICD-10-CM | POA: Diagnosis not present

## 2023-08-02 MED ORDER — SODIUM CHLORIDE 0.9 % IV SOLN
300.0000 mg | Freq: Once | INTRAVENOUS | Status: AC
  Administered 2023-08-02: 300 mg via INTRAVENOUS
  Filled 2023-08-02: qty 300

## 2023-08-02 MED ORDER — SODIUM CHLORIDE 0.9 % IV SOLN: INTRAVENOUS | Status: DC

## 2023-08-02 NOTE — Progress Notes (Signed)
 Pt declined to stay for 30 min post obs, VSS upon discharge.

## 2023-08-02 NOTE — Patient Instructions (Signed)

## 2023-08-05 ENCOUNTER — Ambulatory Visit
Admission: RE | Admit: 2023-08-05 | Discharge: 2023-08-05 | Disposition: A | Discharge status: Home / Self Care | Payer: Medicare Other | Source: Ambulatory Visit | Attending: Pulmonary Disease | Admitting: Pulmonary Disease

## 2023-08-05 DIAGNOSIS — R911 Solitary pulmonary nodule: Secondary | ICD-10-CM | POA: Diagnosis present

## 2023-08-07 ENCOUNTER — Inpatient Hospital Stay: Payer: Medicare Other

## 2023-08-08 ENCOUNTER — Telehealth: Payer: Self-pay | Admitting: Internal Medicine

## 2023-08-08 NOTE — Telephone Encounter (Signed)
 Inbound call from patient requesting to speak with a nurse in regards to her previous ov on 06/10/23. Patient declined to discuss. Would prefer to speak with a nurse instead. Please advise.   Thank you

## 2023-08-08 NOTE — Telephone Encounter (Signed)
 Patient called in requesting to see if Dr. Leonides Schanz would be able to see her for GAVE & handle her ablation treatments. Last EGD was 11/2022 with Dr. Harlen Labs with [facility]. She is due for her next iron transfusion soon & will have HGB checked. She'd like Dr. Leonides Schanz to handle her procedures from now on if possible & would like for her to review. Last seen for OV 06/10/23.

## 2023-08-13 NOTE — Telephone Encounter (Signed)
 Patient called and stated that she would like to speak the nurse regarding scheduling her ablation treatments. Patient is requesting a call back. Please advise.

## 2023-08-14 ENCOUNTER — Inpatient Hospital Stay

## 2023-08-14 ENCOUNTER — Other Ambulatory Visit: Payer: Self-pay

## 2023-08-14 ENCOUNTER — Inpatient Hospital Stay: Payer: Medicare Other

## 2023-08-14 VITALS — BP 160/66 | HR 74 | Temp 98.6°F | Resp 17

## 2023-08-14 DIAGNOSIS — D508 Other iron deficiency anemias: Secondary | ICD-10-CM | POA: Diagnosis not present

## 2023-08-14 DIAGNOSIS — D509 Iron deficiency anemia, unspecified: Secondary | ICD-10-CM

## 2023-08-14 DIAGNOSIS — K31819 Angiodysplasia of stomach and duodenum without bleeding: Secondary | ICD-10-CM

## 2023-08-14 LAB — CBC WITH DIFFERENTIAL/PLATELET
Abs Immature Granulocytes: 0 10*3/uL (ref 0.00–0.07)
Basophils Absolute: 0 10*3/uL (ref 0.0–0.1)
Basophils Relative: 0 %
Eosinophils Absolute: 0.2 10*3/uL (ref 0.0–0.5)
Eosinophils Relative: 5 %
HCT: 30 % — ABNORMAL LOW (ref 36.0–46.0)
Hemoglobin: 9 g/dL — ABNORMAL LOW (ref 12.0–15.0)
Immature Granulocytes: 0 %
Lymphocytes Relative: 20 %
Lymphs Abs: 0.8 10*3/uL (ref 0.7–4.0)
MCH: 25.9 pg — ABNORMAL LOW (ref 26.0–34.0)
MCHC: 30 g/dL (ref 30.0–36.0)
MCV: 86.5 fL (ref 80.0–100.0)
Monocytes Absolute: 0.5 10*3/uL (ref 0.1–1.0)
Monocytes Relative: 13 %
Neutro Abs: 2.6 10*3/uL (ref 1.7–7.7)
Neutrophils Relative %: 62 %
Platelets: 279 10*3/uL (ref 150–400)
RBC: 3.47 MIL/uL — ABNORMAL LOW (ref 3.87–5.11)
RDW: 19.3 % — ABNORMAL HIGH (ref 11.5–15.5)
WBC: 4.2 10*3/uL (ref 4.0–10.5)
nRBC: 0 % (ref 0.0–0.2)

## 2023-08-14 LAB — FERRITIN: Ferritin: 106 ng/mL (ref 11–307)

## 2023-08-14 LAB — IRON AND TIBC
Iron: 417 ug/dL — ABNORMAL HIGH (ref 28–170)
Saturation Ratios: 112 % — ABNORMAL HIGH (ref 10.4–31.8)
TIBC: 374 ug/dL (ref 250–450)

## 2023-08-14 MED ORDER — SODIUM CHLORIDE 0.9 % IV SOLN
300.0000 mg | Freq: Once | INTRAVENOUS | Status: AC
  Administered 2023-08-14: 300 mg via INTRAVENOUS
  Filled 2023-08-14: qty 300

## 2023-08-14 MED ORDER — SODIUM CHLORIDE 0.9 % IV SOLN: INTRAVENOUS | Status: DC

## 2023-08-14 NOTE — Telephone Encounter (Signed)
 Left message for patient to call back. Scheduled EGD with RFA for 09/01/23 at 7:30 am at [facility] with Dr. Leonides Schanz. Will confirm with patient that date works & will follow up with instructions.

## 2023-08-14 NOTE — Telephone Encounter (Signed)
 Patient called and stated that she would like to know if you have something for the 16 or 17 th of April. Patient is requesting a call back. Please advise.

## 2023-08-14 NOTE — Progress Notes (Signed)
 Patient declined 30 minute observation post Venofer Infusion.  Pt understands the risk of reaction.

## 2023-08-14 NOTE — Telephone Encounter (Signed)
 Pt made aware of MD recommendations. Last iron infusion is scheduled for today & then she will have hgb rechecked after. If Dr. Leonides Schanz is able, she would like to be scheduled the week of 4/14 if possible (4/24 is currently full). Advised her I'd be back in touch once Dr. Leonides Schanz has had a chance to review.

## 2023-08-14 NOTE — Patient Instructions (Signed)

## 2023-08-15 ENCOUNTER — Telehealth: Payer: Self-pay | Admitting: *Deleted

## 2023-08-15 NOTE — Telephone Encounter (Signed)
 Confirmed procedure date/time with patient & have sent instructions to her on mychart. She is not on any blood thinners/diabetic medications. Pt verbalized all understanding and had no further questions.

## 2023-08-22 ENCOUNTER — Inpatient Hospital Stay: Payer: Medicare Other

## 2023-08-22 ENCOUNTER — Inpatient Hospital Stay

## 2023-08-25 ENCOUNTER — Telehealth: Payer: Self-pay

## 2023-08-25 NOTE — Telephone Encounter (Signed)
 Procedure:endo Procedure date: 09/01/23 Procedure location: wl Arrival Time: 6 Spoke with the patient Y/N: no left pt a detail message to call back to confirm appointment. Any prep concerns? ..n Has the patient obtained the prep from the pharmacy ? ..n Do you have a care partner and transportation: ..y Any additional concerns? ..n Called pt back in regards to her appoint no answer left another voicemail.@10 :46am 4/9 Spoke with pt she will have a care partner and will arrive on the 14 for her schedule procedure. 08/27/23 @318pm 

## 2023-08-26 NOTE — Telephone Encounter (Signed)
 I left a detailed message for the patient to return call 08/26/23 @ 1:16 pm

## 2023-08-26 NOTE — Telephone Encounter (Signed)
 Patient called and stated that she was returning a call back to [facility]. Patient is requesting a call back. Please advise.

## 2023-08-26 NOTE — Telephone Encounter (Signed)
 Patient is calling back and stated that she would like to speak to either Sophia or Loysie. Patient is requesting a call back. Please advise.

## 2023-08-27 NOTE — Telephone Encounter (Signed)
 Patient calling returning call. On message below.

## 2023-08-28 ENCOUNTER — Encounter: Payer: Self-pay | Admitting: Pulmonary Disease

## 2023-08-28 ENCOUNTER — Ambulatory Visit: Payer: Medicare Other | Admitting: Pulmonary Disease

## 2023-08-28 VITALS — BP 160/80 | HR 64 | Ht 65.0 in | Wt 114.0 lb

## 2023-08-28 DIAGNOSIS — K31819 Angiodysplasia of stomach and duodenum without bleeding: Secondary | ICD-10-CM

## 2023-08-28 DIAGNOSIS — R911 Solitary pulmonary nodule: Secondary | ICD-10-CM | POA: Diagnosis not present

## 2023-08-28 DIAGNOSIS — Z853 Personal history of malignant neoplasm of breast: Secondary | ICD-10-CM

## 2023-08-28 NOTE — Progress Notes (Signed)
 Subjective:    Patient ID: Kristi Carr, female    DOB: [birth year], 78 y.o.   MRN: [id number]  Patient Care Team: [name], MD as PCP - General (Family Medicine) Rogene Claude, MD as Consulting Physician (Obstetrics and Gynecology) Dale Dubonnet, [place] (Optometry) Enid Harry, MD as Consulting Physician (General Surgery) Retta Caster, MD as Consulting Physician (Radiation Oncology) Serafin Dames, MD as Consulting Physician (Gastroenterology) Murleen Arms, MD as Consulting Physician (Hematology and Oncology) Debbie Fails, Laura Polio, NP as Nurse Practitioner (Hematology and Oncology)  Chief Complaint  Patient presents with   Medical Management of Chronic Issues    BACKGROUND/INTERVAL: Patient is a 78 year old lifelong never smoker who presents for follow-up of a lung nodule.  She has a history of GAVE and prior history of left breast cancer in 2015 (ER/PR positive no HER2 amplification).  Now with incidental 8mm nodule noted in the right upper lobe with mostly smooth margins and minimal increase in 6 months time.  Nodule does not meet criteria for PET characterization due to size.  Patient underwent follow-up chest CT on 05 August 2023.   HPI  Discussed the use of AI scribe software for clinical note transcription with the patient, who gave verbal consent to proceed.  History of Present Illness   Kristi Carr is a 78 year old female who presents for follow-up of a lung nodule.  Recent imaging shows the lung nodule is stable with no changes observed. The nodule is smooth, which is often associated with benign processes. She has been undergoing regular scans, initially every three months, then every six months, and now annually, indicating a stable condition over time.  She has been receiving care in [facility] for her GAVE but finds it increasingly burdensome to continue there.  She has switched her GI care to [facility] in [place].  No breathing problems.    Overall she feels well and looks well.      Review of Systems A 10 point review of systems was performed and it is as noted above otherwise negative.   Patient Active Problem List   Diagnosis Date Noted   Abdominal pain 05/25/2023   Lung nodule seen on imaging study 05/01/2023   B12 deficiency 02/17/2023   Connective tissue disease (HCC) 05/07/2022   Osteoarthritis 05/07/2022   Raynaud's disease 05/07/2022   Gastric antral vascular ectasia 08/20/2021   Lichen sclerosus of female genitalia 12/03/2019   Female bladder prolapse 12/03/2019   Osteopenia 12/03/2019   Diverticulosis 04/04/2019   Internal hemorrhoids 04/04/2019   Status post cholecystectomy 12/22/2018   History of adenomatous polyp of colon 09/16/2017   Sensorineural hearing loss (SNHL), bilateral 09/10/2017   IDA (iron  deficiency anemia) 07/31/2017   Murmur 07/06/2017   Recurrent Clostridium difficile diarrhea 03/11/2017   Healthcare maintenance 12/15/2016   Vaginal pessary in situ 02/14/2016   Advance care planning 05/23/2015   History of left breast cancer 08/11/2013   Diarrhea 11/04/2012   Medicare annual wellness visit, subsequent 11/04/2012   Hypercholesteremia 02/20/2011   HTN (hypertension) 12/19/2010   Vitamin D  deficiency 06/22/2008   RENAL CALCULUS 08/19/2003    Social History   Tobacco Use   Smoking status: Never   Smokeless tobacco: Never  Substance Use Topics   Alcohol use: No    Allergies  Allergen Reactions   Clindamycin/Lincomycin Other (See Comments)    GI upset attributed to med.  Not an allergy.     Current Meds  Medication Sig   amLODipine  (NORVASC ) 2.5  MG tablet TAKE 1-2 TABLETS (2.5-5 MG TOTAL) BY MOUTH DAILY.   Cholecalciferol (VITAMIN D ) 50 MCG (2000 UT) CAPS Take 2,000 Units by mouth daily.   cyanocobalamin  (VITAMIN B12) 1000 MCG tablet Take 1,000 mcg by mouth 2 (two) times daily.   dicyclomine  (BENTYL ) 10 MG capsule Take 1 capsule (10 mg total) by mouth as needed for  spasms.   diphenoxylate -atropine  (LOMOTIL ) 2.5-0.025 MG tablet Take 1 tablet by mouth 4 (four) times daily as needed for diarrhea or loose stools.   metoprolol  succinate (TOPROL -XL) 50 MG 24 hr tablet Take 1 tablet (50 mg total) by mouth daily. TAKE WITH OR IMMEDIATELY FOLLOWING A MEAL.   omeprazole  (PRILOSEC) 40 MG capsule TAKE 1 CAPSULE (40 MG TOTAL) BY MOUTH DAILY.    Immunization History  Administered Date(s) Administered   Fluad Quad(high Dose 65+) 01/29/2022   Fluad Trivalent(High Dose 65+) 02/19/2023   Influenza Split 04/10/2013, 05/23/2015, 03/10/2017   Influenza Whole 04/10/2013   Influenza, High Dose Seasonal PF 02/23/2018, 02/08/2019   Influenza,inj,Quad PF,6+ Mos 05/23/2015, 03/10/2017   Influenza,inj,quad, With Preservative 02/27/2021   Influenza-Unspecified 05/23/2015, 03/10/2017   PFIZER(Purple Top)SARS-COV-2 Vaccination 06/25/2019, 07/16/2019, 05/17/2020   Pneumococcal Conjugate-13 05/23/2015   Pneumococcal Polysaccharide-23 11/02/2012   Tdap 12/05/2010   Zoster Recombinant(Shingrix) 01/11/2019, 05/17/2019   Zoster, Live 06/22/2008, 01/11/2019, 05/17/2019        Objective:     BP (!) 160/80   Pulse 64   Ht 5\' 5"  (1.651 m)   Wt 114 lb (51.7 kg)   SpO2 100%   BMI 18.97 kg/m   SpO2: 100 %  GENERAL: Thin well-developed woman, very well-groomed, fully ambulatory, no conversational dyspnea. HEAD: Normocephalic, atraumatic.  EYES: Pupils equal, round, reactive to light.  No scleral icterus.  MOUTH: Oral mucosa moist.  No thrush.  Pharynx clear. NECK: Supple. No thyromegaly. Trachea midline. No JVD.  No adenopathy. PULMONARY: Good air entry bilaterally.  No adventitious sounds. CARDIOVASCULAR: S1 and S2. Regular rate and rhythm.  No rubs, murmurs or gallops heard. ABDOMEN: Benign. MUSCULOSKELETAL: No joint deformity, no clubbing, no edema.  NEUROLOGIC: No overt focal deficit, no gait disturbance, speech is fluent. SKIN: Intact,warm,dry. PSYCH:Mood and  behavior normal.  Chest CT from 05 August 2023 independently reviewed and also shown to the patient.  The 8 mm right upper lobe pulmonary nodule is stable and has been stable for a year.  Will follow-up with CT chest in another years time.       Assessment & Plan:     ICD-10-CM   1. Lung nodule seen on imaging study  R91.1 CT CHEST WO CONTRAST    2. History of left breast cancer  Z85.3     3. Gastric antral vascular ectasia  K31.819       Orders Placed This Encounter  Procedures   CT CHEST WO CONTRAST    Around August 09, 2024 (one year after last scan)    Standing Status:   Future    Expected Date:   08/09/2024    Expiration Date:   08/27/2024    Preferred imaging location?:   [facility]   Discussion:    Lung nodule The lung nodule remains unchanged on recent imaging, with smooth contours suggestive of a benign hamartoma. Surveillance has progressed from three-month to six-month intervals, and now to annual scans due to stability. Radiation exposure is minimized with current scanning techniques. - Order follow-up scan in one year.  Gastric Antral Vascular Ectasia (GAVE) She is scheduled for an ablation  procedure for GAVE, to be performed by Dr. Rosaline Coma at the [facility] facility. She will transition her care to the [facility] for convenience. - Proceed with scheduled ablation for GAVE with Dr. Rosaline Coma.      Advised if symptoms do not improve or worsen, to please contact office for sooner follow up or seek emergency care.    I spent 30 minutes of dedicated to the care of this patient on the date of this encounter to include pre-visit review of records, face-to-face time with the patient discussing conditions above, post visit ordering of testing, clinical documentation with the electronic health record, making appropriate referrals as documented, and communicating necessary findings to members of the patients care team.     C. Chloe Counter, MD Advanced  Bronchoscopy PCCM  [facility]    *This note was generated using voice recognition software/Dragon and/or AI transcription program.  Despite best efforts to proofread, errors can occur which can change the meaning. Any transcriptional errors that result from this process are unintentional and may not be fully corrected at the time of dictation.

## 2023-08-28 NOTE — Patient Instructions (Signed)
 VISIT SUMMARY:  You came in today for a follow-up on your lung nodule. Recent imaging shows that the nodule is stable with no changes observed. You have been receiving care in [place] but will be transitioning to the [facility] group for convenience.  YOUR PLAN:  -LUNG NODULE: A lung nodule is a small growth in the lung that is often benign. Your recent imaging shows that the nodule remains unchanged and has smooth contours, which is a good sign. We will continue to monitor it with annual scans to ensure it remains stable.  -GASTRIC ANTRAL VASCULAR ECTASIA (GAVE): GAVE is a condition where the stomach lining has abnormal blood vessels that can cause bleeding. You are scheduled for an ablation procedure to treat this condition, which will be performed by Dr. Leonides Schanz at [facility] in [place].  INSTRUCTIONS:  You will be scheduled for your follow-up scan for the lung nodule in one year with follow-up after that with me. Proceed with your scheduled ablation for GAVE with Dr. Leonides Schanz.

## 2023-08-31 ENCOUNTER — Encounter (HOSPITAL_COMMUNITY): Payer: Self-pay | Admitting: Internal Medicine

## 2023-08-31 NOTE — Anesthesia Preprocedure Evaluation (Signed)
 Anesthesia Evaluation  Patient identified by MRN, date of birth, ID band Patient awake    Reviewed: Allergy & Precautions, NPO status , Patient's Chart, lab work & pertinent test results  Airway Mallampati: II       Dental no notable dental hx. (+) Dental Advisory Given, Teeth Intact   Pulmonary neg pulmonary ROS   Pulmonary exam normal breath sounds clear to auscultation       Cardiovascular hypertension, Pt. on medications and Pt. on home beta blockers Normal cardiovascular exam+ Valvular Problems/Murmurs  Rhythm:Regular Rate:Normal  Hx/o Raynaud's disease   Neuro/Psych negative neurological ROS  negative psych ROS   GI/Hepatic Neg liver ROS,,,Hx/o GAVE S/P RFA   Endo/Other  Hyperlipidemia Hx/o left breast Ca S/P lumpectomy + RT  Renal/GU Renal diseaseHx/o renal calculi Bladder dysfunction  Bladder prolapse- pessary    Musculoskeletal  (+) Arthritis , Osteoarthritis,    Abdominal   Peds  Hematology  (+) Blood dyscrasia, anemia   Anesthesia Other Findings   Reproductive/Obstetrics                              Anesthesia Physical Anesthesia Plan  ASA: 2  Anesthesia Plan: MAC   Post-op Pain Management: Minimal or no pain anticipated   Induction: Intravenous  PONV Risk Score and Plan: 2 and Treatment may vary due to age or medical condition and Propofol infusion  Airway Management Planned: Natural Airway and Nasal Cannula  Additional Equipment: None  Intra-op Plan:   Post-operative Plan:   Informed Consent: I have reviewed the patients History and Physical, chart, labs and discussed the procedure including the risks, benefits and alternatives for the proposed anesthesia with the patient or authorized representative who has indicated his/her understanding and acceptance.     Dental advisory given  Plan Discussed with: Anesthesiologist and CRNA  Anesthesia Plan  Comments:          Anesthesia Quick Evaluation

## 2023-09-01 ENCOUNTER — Ambulatory Visit (HOSPITAL_COMMUNITY): Admitting: Anesthesiology

## 2023-09-01 ENCOUNTER — Encounter (HOSPITAL_COMMUNITY): Payer: Self-pay | Admitting: Internal Medicine

## 2023-09-01 ENCOUNTER — Ambulatory Visit (HOSPITAL_COMMUNITY)
Admission: RE | Admit: 2023-09-01 | Discharge: 2023-09-01 | Disposition: A | Discharge status: Home / Self Care | Attending: Internal Medicine | Admitting: Internal Medicine

## 2023-09-01 ENCOUNTER — Other Ambulatory Visit: Payer: Self-pay

## 2023-09-01 ENCOUNTER — Encounter (HOSPITAL_COMMUNITY)
Admission: RE | Disposition: A | Discharge status: Home / Self Care | Payer: Self-pay | Source: Home / Self Care | Attending: Internal Medicine

## 2023-09-01 DIAGNOSIS — K31819 Angiodysplasia of stomach and duodenum without bleeding: Secondary | ICD-10-CM | POA: Insufficient documentation

## 2023-09-01 DIAGNOSIS — I10 Essential (primary) hypertension: Secondary | ICD-10-CM | POA: Insufficient documentation

## 2023-09-01 DIAGNOSIS — Z923 Personal history of irradiation: Secondary | ICD-10-CM | POA: Diagnosis not present

## 2023-09-01 DIAGNOSIS — Z853 Personal history of malignant neoplasm of breast: Secondary | ICD-10-CM | POA: Diagnosis not present

## 2023-09-01 HISTORY — PX: ESOPHAGOGASTRODUODENOSCOPY: SHX5428

## 2023-09-01 HISTORY — PX: GI RADIOFREQUENCY ABLATION: SHX6807

## 2023-09-01 SURGERY — EGD (ESOPHAGOGASTRODUODENOSCOPY)
Anesthesia: Monitor Anesthesia Care

## 2023-09-01 MED ORDER — PROPOFOL 500 MG/50ML IV EMUL
INTRAVENOUS | Status: DC | PRN
  Administered 2023-09-01: 100 ug/kg/min via INTRAVENOUS

## 2023-09-01 MED ORDER — LIDOCAINE HCL 4 % EX SOLN
CUTANEOUS | Status: DC | PRN
  Administered 2023-09-01: 3 mL via TOPICAL

## 2023-09-01 MED ORDER — PROPOFOL 10 MG/ML IV BOLUS
INTRAVENOUS | Status: DC | PRN
  Administered 2023-09-01: 60 mg via INTRAVENOUS

## 2023-09-01 MED ORDER — SODIUM CHLORIDE 0.9 % IV SOLN
INTRAVENOUS | Status: AC | PRN
  Administered 2023-09-01: 250 mL via INTRAMUSCULAR

## 2023-09-01 MED ORDER — SODIUM CHLORIDE 0.9 % IV SOLN
INTRAVENOUS | Status: DC
Start: 2023-09-01 — End: 2023-09-01

## 2023-09-01 MED ORDER — LIDOCAINE 2% (20 MG/ML) 5 ML SYRINGE
INTRAMUSCULAR | Status: DC | PRN
  Administered 2023-09-01: 100 mg via INTRAVENOUS

## 2023-09-01 NOTE — H&P (Signed)
 GASTROENTEROLOGY PROCEDURE H&P NOTE   Primary Care Physician: Joaquim Nam, MD    Reason for Procedure:   GAVE  Plan:    EGD with RFA  Patient is appropriate for endoscopic procedure(s) in the ambulatory (hospital) setting.  The nature of the procedure, as well as the risks, benefits, and alternatives were carefully and thoroughly reviewed with the patient. Ample time for discussion and questions allowed. The patient understood, was satisfied, and agreed to proceed.     HPI: Kristi Carr is a 78 y.o. female who presents for EGD with RFA for treatment of GAVE.  Past Medical History:  Diagnosis Date   Anemia    Breast cancer of upper-outer quadrant of left female breast (HCC)    C. difficile diarrhea    Cataract    Colon polyps    Gallstones 08/23/2003   x 2   Gastric hemorrhage due to gastric antral vascular ectasia (GAVE)    s/p radiofrequency ablation    GAVE (gastric antral vascular ectasia)    Hot flashes    Hypertension    Kidney stones    Radiation 10/14/13-12/02/13   left breast 50.4 gray, lumpectomy cavity boosted to 63 gray   Wears glasses     Past Surgical History:  Procedure Laterality Date   BREAST LUMPECTOMY WITH SENTINEL LYMPH NODE BIOPSY Left 09/06/2013   LEFT BREAST SEED GUIDED LUMPECTOMY WITH LEFT AXILLARY SENTINEL NODE BIOPSY (Left)   CATARACT EXTRACTION, BILATERAL Bilateral    right-07/31/22, left-08/14/22   CHOLECYSTECTOMY     COLONOSCOPY     PARTIAL HYSTERECTOMY  05/20/1978   ovaries left in   TONSILLECTOMY     TUBAL LIGATION     WISDOM TOOTH EXTRACTION      Prior to Admission medications   Medication Sig Start Date End Date Taking? Authorizing Provider  amLODipine (NORVASC) 2.5 MG tablet TAKE 1-2 TABLETS (2.5-5 MG TOTAL) BY MOUTH DAILY. 06/09/23  Yes Joaquim Nam, MD  Cholecalciferol (VITAMIN D) 50 MCG (2000 UT) CAPS Take 2,000 Units by mouth daily.   Yes [provider]  cyanocobalamin (VITAMIN B12) 1000 MCG tablet Take  1,000 mcg by mouth 2 (two) times daily.   Yes [provider]  dicyclomine (BENTYL) 10 MG capsule Take 1 capsule (10 mg total) by mouth as needed for spasms. 06/10/23  Yes Imogene Burn, MD  diphenoxylate-atropine (LOMOTIL) 2.5-0.025 MG tablet Take 1 tablet by mouth 4 (four) times daily as needed for diarrhea or loose stools. 06/10/23  Yes Imogene Burn, MD  metoprolol succinate (TOPROL-XL) 50 MG 24 hr tablet Take 1 tablet (50 mg total) by mouth daily. TAKE WITH OR IMMEDIATELY FOLLOWING A MEAL. 01/16/23  Yes Joaquim Nam, MD  omeprazole (PRILOSEC) 40 MG capsule TAKE 1 CAPSULE (40 MG TOTAL) BY MOUTH DAILY. 06/12/23  Yes Imogene Burn, MD    Current Facility-Administered Medications  Medication Dose Route Frequency Provider Last Rate Last Admin   0.9 %  sodium chloride infusion   Intravenous Continuous Imogene Burn, MD       0.9 %  sodium chloride infusion    Continuous PRN Imogene Burn, MD 10 mL/hr at 09/01/23 0656 250 mL at 09/01/23 0656    Allergies as of 08/14/2023 - Review Complete 08/02/2023  Allergen Reaction Noted   Clindamycin/lincomycin Other (See Comments) 03/10/2017    Family History  Problem Relation Age of Onset   Diabetes Mother    Hyperlipidemia Mother    Hypertension Mother  Breast cancer Mother    Heart attack Father    Heart disease Father    Hypertension Sister    Diabetes Sister    Heart disease Brother        CHF  EF 10%   Hypertension Brother    Gout Brother    Depression Neg Hx    Alcohol abuse Neg Hx    Drug abuse Neg Hx    Colon cancer Neg Hx    Uterine cancer Neg Hx    Ovarian cancer Neg Hx    Prostate cancer Neg Hx     Social History   Socioeconomic History   Marital status: Married    Spouse name: Not on file   Number of children: 2   Years of education: Not on file   Highest education level: Not on file  Occupational History   Occupation: Retired from [employer] in 2008  Tobacco Use   Smoking status: Never    Smokeless tobacco: Never  Vaping Use   Vaping status: Never Used  Substance and Sexual Activity   Alcohol use: No   Drug use: No   Sexual activity: Yes  Other Topics Concern   Not on file  Social History Narrative   Married 1966   2 kids   4 grand children   Retired from [employer]   Social Drivers of Health   Financial Resource Strain: Low Risk  (12/30/2022)   Overall Financial Resource Strain (CARDIA)    Difficulty of Paying Living Expenses: Not hard at all  Food Insecurity: No Food Insecurity (12/30/2022)   Hunger Vital Sign    Worried About Running Out of Food in the Last Year: Never true    Ran Out of Food in the Last Year: Never true  Transportation Needs: No Transportation Needs (12/30/2022)   PRAPARE - Administrator, Civil Service (Medical): No    Lack of Transportation (Non-Medical): No  Physical Activity: Insufficiently Active (12/30/2022)   Exercise Vital Sign    Days of Exercise per Week: 4 days    Minutes of Exercise per Session: 30 min  Stress: No Stress Concern Present (12/30/2022)   [employer] of Occupational Health - Occupational Stress Questionnaire    Feeling of Stress : Not at all  Social Connections: Moderately Isolated (12/30/2022)   Social Connection and Isolation Panel [NHANES]    Frequency of Communication with Friends and Family: More than three times a week    Frequency of Social Gatherings with Friends and Family: More than three times a week    Attends Religious Services: Never    Database administrator or Organizations: No    Attends Banker Meetings: Never    Marital Status: Married  Catering manager Violence: Not At Risk (12/30/2022)   Humiliation, Afraid, Rape, and Kick questionnaire    Fear of Current or Ex-Partner: No    Emotionally Abused: No    Physically Abused: No    Sexually Abused: No    Physical Exam: Vital signs in last 24 hours: BP (!) 177/81   Pulse 63   Temp 97.8 F (36.6 C)   Resp 14   Ht 5'  5" (1.651 m)   Wt 51.7 kg   SpO2 100%   BMI 18.97 kg/m  GEN: NAD EYE: Sclerae anicteric ENT: MMM CV: Non-tachycardic Pulm: No increased work of breathing GI: Soft, NT/ND NEURO:  Alert & Oriented   Eulah Pont, MD [facility]  09/01/2023 7:25 AM

## 2023-09-01 NOTE — Transfer of Care (Signed)
 Immediate Anesthesia Transfer of Care Note  Patient: Kristi Carr  Procedure(s) Performed: EGD (ESOPHAGOGASTRODUODENOSCOPY) RADIOFREQUENCY ABLATION, UPPER GI TRACT, ENDOSCOPIC  Patient Location: PACU and Endoscopy Unit  Anesthesia Type:MAC  Level of Consciousness: awake and alert   Airway & Oxygen Therapy: Patient Spontanous Breathing  Post-op Assessment: Report given to RN and Post -op Vital signs reviewed and stable  Post vital signs: Reviewed and stable  Last Vitals:  Vitals Value Taken Time  BP    Temp    Pulse    Resp    SpO2      Last Pain:  Vitals:   09/01/23 0654  PainSc: 0-No pain         Complications: No notable events documented.

## 2023-09-01 NOTE — Discharge Instructions (Signed)

## 2023-09-01 NOTE — Anesthesia Postprocedure Evaluation (Signed)
 Anesthesia Post Note  Patient: Kristi Carr  Procedure(s) Performed: EGD (ESOPHAGOGASTRODUODENOSCOPY) RADIOFREQUENCY ABLATION, UPPER GI TRACT, ENDOSCOPIC     Patient location during evaluation: PACU Anesthesia Type: MAC Level of consciousness: awake and alert and oriented Pain management: pain level controlled Vital Signs Assessment: post-procedure vital signs reviewed and stable Respiratory status: spontaneous breathing, nonlabored ventilation and respiratory function stable Cardiovascular status: stable and blood pressure returned to baseline Postop Assessment: no apparent nausea or vomiting Anesthetic complications: no   No notable events documented.  Last Vitals:  Vitals:   09/01/23 0820 09/01/23 0830  BP: 132/61 (!) 141/60  Pulse: (!) 56 (!) 57  Resp: 14 19  Temp:    SpO2: 98% 97%    Last Pain:  Vitals:   09/01/23 0830  TempSrc:   PainSc: 0-No pain                 Inocencia Murtaugh A.

## 2023-09-01 NOTE — Op Note (Addendum)
 [facility] Patient Name: Kristi Carr Procedure Date : 09/01/2023 MRN: [id number] Attending MD: Particia Lather , , [id number] Date of Birth: 11-16-1945 CSN: [id number] Age: 78 Admit Type: Outpatient Procedure:                Upper GI endoscopy Indications:              Watermelon stomach (GAVE syndrome) Providers:                Clifton James, RN, Alan Ripper,                            Technician Referring MD:             Dwana Curd. Para March, MD Medicines:                Monitored Anesthesia Care Complications:            No immediate complications. Estimated Blood Loss:     Estimated blood loss was minimal. Procedure:                Pre-Anesthesia Assessment:                           - Prior to the procedure, a History and Physical                            was performed, and patient medications and                            allergies were reviewed. The patient's tolerance of                            previous anesthesia was also reviewed. The risks                            and benefits of the procedure and the sedation                            options and risks were discussed with the patient.                            All questions were answered, and informed consent                            was obtained. Prior Anticoagulants: The patient has                            taken no anticoagulant or antiplatelet agents. ASA                            Grade Assessment: II - A patient with mild systemic                            disease. After reviewing the risks and benefits,  the patient was deemed in satisfactory condition to                            undergo the procedure.                           After obtaining informed consent, the endoscope was                            passed under direct vision. Throughout the                            procedure, the patient's blood pressure, pulse, and                             oxygen saturations were monitored continuously. The                            [id number]-H190 ([id number]) Olympus endoscope was introduced                            through the mouth, and advanced to the second part                            of duodenum. The upper GI endoscopy was                            accomplished without difficulty. The patient                            tolerated the procedure well. Scope In: Scope Out: Findings:      The examined esophagus was normal.      Gastric antral vascular ectasia was present in the gastric antrum. Focal       radiofrequency ablation of gastric antral vascular ectasia in the       gastric antrum was performed. With the endoscope in place, the position       and extent of the abnormal mucosa and appropriate anatomic landmarks       were noted. The radiofrequency channel ablation catheter was introduced       through the endoscope working channel. The endoscope with the ablation       catheter was advanced to the areas of abnormal mucosa. The endoscope       with the channel ablation catheter was positioned under direct       visualization so that the catheter was placed in contact with the       surface of the abnormal mucosa. Energy was applied twice at 12 J/cm2.       Ablation was repeated in a likewise fashion to all visible abnormal       mucosa. The ablation catheter was removed through the endoscope working       channel. The areas where abnormal mucosa had been ablated were examined.       The endoscope was then removed.      The examined duodenum was normal. Impression:               -  Normal esophagus.                           - Gastric antral vascular ectasia. Treated with                            radiofrequency ablation.                           - Normal examined duodenum.                           - No specimens collected. Recommendation:           - Discharge patient to home (with escort).                            - Continue omeprazole 40 mg daily.                           - Can consider repeat EGD for retreatment of GAVE                            if blood counts start to drop again.                           - The findings and recommendations were discussed                            with the patient. Procedure Code(s):        --- Professional ---                           [phone], Esophagogastroduodenoscopy, flexible,                            transoral; with control of bleeding, any method Diagnosis Code(s):        --- Professional ---                           N82.956, Angiodysplasia of stomach and duodenum                            without bleeding CPT copyright 2022 American Medical Association. All rights reserved. The codes documented in this report are preliminary and upon coder review may  be revised to meet current compliance requirements. Dr Pedro Bourgeois "Anastacio Balm" Rosaline Coma,  09/01/2023 8:17:12 AM Number of Addenda: 0

## 2023-09-03 ENCOUNTER — Encounter (HOSPITAL_COMMUNITY): Payer: Self-pay | Admitting: Internal Medicine

## 2023-09-04 LAB — HM MAMMOGRAPHY

## 2023-09-06 ENCOUNTER — Encounter: Payer: Self-pay | Admitting: Pulmonary Disease

## 2023-09-07 ENCOUNTER — Other Ambulatory Visit: Payer: Self-pay | Admitting: Internal Medicine

## 2023-09-16 ENCOUNTER — Encounter: Payer: Self-pay | Admitting: Hematology and Oncology

## 2023-09-16 NOTE — Telephone Encounter (Signed)
 No entry

## 2023-10-15 ENCOUNTER — Telehealth: Payer: Self-pay

## 2023-10-16 ENCOUNTER — Inpatient Hospital Stay: Payer: Medicare Other

## 2023-10-16 ENCOUNTER — Inpatient Hospital Stay: Payer: Medicare Other | Attending: Hematology and Oncology | Admitting: Hematology and Oncology

## 2023-10-16 VITALS — BP 179/65 | HR 57 | Temp 98.7°F | Resp 18 | Wt 116.1 lb

## 2023-10-16 DIAGNOSIS — L299 Pruritus, unspecified: Secondary | ICD-10-CM | POA: Diagnosis not present

## 2023-10-16 DIAGNOSIS — D500 Iron deficiency anemia secondary to blood loss (chronic): Secondary | ICD-10-CM | POA: Diagnosis present

## 2023-10-16 DIAGNOSIS — D508 Other iron deficiency anemias: Secondary | ICD-10-CM | POA: Diagnosis not present

## 2023-10-16 DIAGNOSIS — Z923 Personal history of irradiation: Secondary | ICD-10-CM | POA: Diagnosis not present

## 2023-10-16 DIAGNOSIS — Z803 Family history of malignant neoplasm of breast: Secondary | ICD-10-CM | POA: Insufficient documentation

## 2023-10-16 DIAGNOSIS — D509 Iron deficiency anemia, unspecified: Secondary | ICD-10-CM

## 2023-10-16 DIAGNOSIS — Z853 Personal history of malignant neoplasm of breast: Secondary | ICD-10-CM | POA: Insufficient documentation

## 2023-10-16 LAB — CBC WITH DIFFERENTIAL/PLATELET
Abs Immature Granulocytes: 0.01 10*3/uL (ref 0.00–0.07)
Basophils Absolute: 0 10*3/uL (ref 0.0–0.1)
Basophils Relative: 1 %
Eosinophils Absolute: 0.3 10*3/uL (ref 0.0–0.5)
Eosinophils Relative: 6 %
HCT: 34.7 % — ABNORMAL LOW (ref 36.0–46.0)
Hemoglobin: 10.7 g/dL — ABNORMAL LOW (ref 12.0–15.0)
Immature Granulocytes: 0 %
Lymphocytes Relative: 14 %
Lymphs Abs: 0.7 10*3/uL (ref 0.7–4.0)
MCH: 27 pg (ref 26.0–34.0)
MCHC: 30.8 g/dL (ref 30.0–36.0)
MCV: 87.6 fL (ref 80.0–100.0)
Monocytes Absolute: 0.5 10*3/uL (ref 0.1–1.0)
Monocytes Relative: 11 %
Neutro Abs: 3.5 10*3/uL (ref 1.7–7.7)
Neutrophils Relative %: 68 %
Platelets: 244 10*3/uL (ref 150–400)
RBC: 3.96 MIL/uL (ref 3.87–5.11)
RDW: 18.7 % — ABNORMAL HIGH (ref 11.5–15.5)
WBC: 5.1 10*3/uL (ref 4.0–10.5)
nRBC: 0 % (ref 0.0–0.2)

## 2023-10-16 NOTE — Progress Notes (Signed)
 [facility] Cancer Follow up:    Kristi Galea, MD [address] [place] [address]   DIAGNOSIS:  Cancer Staging  History of left breast cancer Staging form: Breast, AJCC 7th Edition - Clinical: Stage IIA (T2, N0, cM0) - Unsigned Specimen type: Core Needle Biopsy Histopathologic type: 9931 Laterality: Left Staging comments: Staged at breast conference 08/11/13.  - Pathologic: No stage assigned - Unsigned Specimen type: Core Needle Biopsy Histopathologic type: 9931 Laterality: Left    SUMMARY OF ONCOLOGIC/HEMATOLOGIC HISTORY: [place] woman status post left breast biopsy 08/02/2013 for a clinical T2 N0, stage IIA invasive papillary breast cancer, grade 1, estrogen receptor and progesterone receptor both 100% positive, with an MIB-1 of 9% and no HER-2 amplification   (1) Status post left lumpectomy and sentinel lymph node sampling 09/06/2013 for a pT1c pN0, stage IA invasive ductal carcinoma, grade 1, repeat HER-2 again negative   (2) Adjuvant radiation completed 11/30/2013   (3) started tamoxifen  12/18/2013, completing 5 years April 2020   (4)  anemia: as of 01/09/2018, ferritin was 16.2,, iron  saturation, B12 and folate are normal as is the reticulocyte count and creatinine.              (a) REVIEW OF BLOOD FILM 01/09/2018 shows mild rouleaux, but no other abnormalities: Specifically there are no tailed poikilocytes or significant anisocytosis, no left shift in the white cell series, and no artifactual platelet counts             (b) ferritin on 07/26/2019 was 3.5 (severe deficiency)             (c) iron  sucrose started 08/18/2019, to be repeated x5, received 12/14/2019-01/11/2020, 06/14/2020-08/04/2020, 02/24/2021-03/12/2021             (d) GI workup:                        () colonoscopy 04/05/2019: diverticulosis                         () Diagnosed in 2021 with Gastric hemorrhage due to gastric antral vascular ectasia--sees Dr. Rowena Carr for  radiofrequency ablation of the areas in question (last performed 11/2021)     CURRENT THERAPY: intermittent IV iron , b12 supplementation  INTERVAL HISTORY:  Discussed the use of AI scribe software for clinical note transcription with the patient, who gave verbal consent to proceed.  Discussed the use of AI scribe software for clinical note transcription with the patient, who gave verbal consent to proceed.  History of Present Illness  Kristi Carr is a 78 year old female with anemia who presents for follow-up after radiofrequency ablation for GAVE and IDA  She underwent radiofrequency ablation on September 01, 2023, which has significantly improved her symptoms. Her stomach pain has decreased, and her hemoglobin level has improved to 10.7, similar to levels from six to eight months ago. She feels better and notes an increase in energy levels.  She experiences occasional melena, but these are not frequent. She went nine months without significant bleeding, an improvement from the usual six-month interval. She does not take iron  supplements but continues with B12 supplementation, which she believes has helped maintain her B12 levels within the normal range.  She reports persistent pruritus in her breast, which she manages with castor oil, finding it effective for both itching and scaliness. Her last mammogram was in April at [facility].  She experiences cold-induced pallor  and numbness in her fingers, which she attributes to her anemia. She uses hand warmers and gloves to manage these symptoms during colder months.  No significant weight loss, but there is a slight weight gain of two pounds. Nails are not brittle.   Patient Active Problem List   Diagnosis Date Noted   Abdominal pain 05/25/2023   Lung nodule seen on imaging study 05/01/2023   B12 deficiency 02/17/2023   Connective tissue disease (HCC) 05/07/2022   Osteoarthritis 05/07/2022   Raynaud's disease 05/07/2022   Gastric antral  vascular ectasia 08/20/2021   Lichen sclerosus of female genitalia 12/03/2019   Female bladder prolapse 12/03/2019   Osteopenia 12/03/2019   Diverticulosis 04/04/2019   Internal hemorrhoids 04/04/2019   Status post cholecystectomy 12/22/2018   History of adenomatous polyp of colon 09/16/2017   Sensorineural hearing loss (SNHL), bilateral 09/10/2017   IDA (iron  deficiency anemia) 07/31/2017   Murmur 07/06/2017   Recurrent Clostridium difficile diarrhea 03/11/2017   Healthcare maintenance 12/15/2016   Vaginal pessary in situ 02/14/2016   Advance care planning 05/23/2015   History of left breast cancer 08/11/2013   Diarrhea 11/04/2012   Medicare annual wellness visit, subsequent 11/04/2012   Hypercholesteremia 02/20/2011   HTN (hypertension) 12/19/2010   Vitamin D  deficiency 06/22/2008   RENAL CALCULUS 08/19/2003    is allergic to clindamycin/lincomycin.  MEDICAL HISTORY: Past Medical History:  Diagnosis Date   Anemia    Breast cancer of upper-outer quadrant of left female breast (HCC)    C. difficile diarrhea    Cataract    Colon polyps    Gallstones 08/23/2003   x 2   Gastric hemorrhage due to gastric antral vascular ectasia (GAVE)    s/p radiofrequency ablation    GAVE (gastric antral vascular ectasia)    Hot flashes    Hypertension    Kidney stones    Radiation 10/14/13-12/02/13   left breast 50.4 gray, lumpectomy cavity boosted to 63 gray   Wears glasses     SURGICAL HISTORY: Past Surgical History:  Procedure Laterality Date   BREAST LUMPECTOMY WITH SENTINEL LYMPH NODE BIOPSY Left 09/06/2013   LEFT BREAST SEED GUIDED LUMPECTOMY WITH LEFT AXILLARY SENTINEL NODE BIOPSY (Left)   CATARACT EXTRACTION, BILATERAL Bilateral    right-07/31/22, left-08/14/22   CHOLECYSTECTOMY     COLONOSCOPY     ESOPHAGOGASTRODUODENOSCOPY N/A 09/01/2023   Procedure: EGD (ESOPHAGOGASTRODUODENOSCOPY);  Surgeon: Kristi Drum, MD;  Location: [facility] ENDOSCOPY;  Service: Gastroenterology;   Laterality: N/A;  EGD with RFA for GAVE   GI RADIOFREQUENCY ABLATION  09/01/2023   Procedure: RADIOFREQUENCY ABLATION, UPPER GI TRACT, ENDOSCOPIC;  Surgeon: Kristi Drum, MD;  Location: [facility] ENDOSCOPY;  Service: Gastroenterology;;   PARTIAL HYSTERECTOMY  05/20/1978   ovaries left in   TONSILLECTOMY     TUBAL LIGATION     WISDOM TOOTH EXTRACTION      SOCIAL HISTORY: Social History   Socioeconomic History   Marital status: Married    Spouse name: Not on file   Number of children: 2   Years of education: Not on file   Highest education level: Not on file  Occupational History   Occupation: Retired from [employer] in 2008  Tobacco Use   Smoking status: Never   Smokeless tobacco: Never  Vaping Use   Vaping status: Never Used  Substance and Sexual Activity   Alcohol use: No   Drug use: No   Sexual activity: Yes  Other Topics Concern   Not on file  Social  History Narrative   Married 1966   2 kids   4 grand children   Retired from [employer]   Social Drivers of Corporate investment banker Strain: Low Risk  (12/30/2022)   Overall Financial Resource Strain (CARDIA)    Difficulty of Paying Living Expenses: Not hard at all  Food Insecurity: No Food Insecurity (12/30/2022)   Hunger Vital Sign    Worried About Running Out of Food in the Last Year: Never true    Ran Out of Food in the Last Year: Never true  Transportation Needs: No Transportation Needs (12/30/2022)   PRAPARE - Administrator, Civil Service (Medical): No    Lack of Transportation (Non-Medical): No  Physical Activity: Insufficiently Active (12/30/2022)   Exercise Vital Sign    Days of Exercise per Week: 4 days    Minutes of Exercise per Session: 30 min  Stress: No Stress Concern Present (12/30/2022)   [employer] of Occupational Health - Occupational Stress Questionnaire    Feeling of Stress : Not at all  Social Connections: Moderately Isolated (12/30/2022)   Social Connection and Isolation Panel  [NHANES]    Frequency of Communication with Friends and Family: More than three times a week    Frequency of Social Gatherings with Friends and Family: More than three times a week    Attends Religious Services: Never    Database administrator or Organizations: No    Attends Banker Meetings: Never    Marital Status: Married  Catering manager Violence: Not At Risk (12/30/2022)   Humiliation, Afraid, Rape, and Kick questionnaire    Fear of Current or Ex-Partner: No    Emotionally Abused: No    Physically Abused: No    Sexually Abused: No    FAMILY HISTORY: Family History  Problem Relation Age of Onset   Diabetes Mother    Hyperlipidemia Mother    Hypertension Mother    Breast cancer Mother    Heart attack Father    Heart disease Father    Hypertension Sister    Diabetes Sister    Heart disease Brother        CHF  EF 10%   Hypertension Brother    Gout Brother    Depression Neg Hx    Alcohol abuse Neg Hx    Drug abuse Neg Hx    Colon cancer Neg Hx    Uterine cancer Neg Hx    Ovarian cancer Neg Hx    Prostate cancer Neg Hx     Review of Systems  Constitutional:  Negative for appetite change, chills, fatigue, fever and unexpected weight change.  HENT:   Negative for hearing loss, lump/mass and trouble swallowing.   Eyes:  Negative for eye problems and icterus.  Respiratory:  Negative for chest tightness, cough and shortness of breath.   Cardiovascular:  Negative for chest pain, leg swelling and palpitations.  Gastrointestinal:  Negative for abdominal distention, abdominal pain, constipation, diarrhea, nausea and vomiting.  Endocrine: Negative for hot flashes.  Genitourinary:  Negative for difficulty urinating.   Musculoskeletal:  Negative for arthralgias.  Skin:  Negative for itching and rash.  Neurological:  Negative for dizziness, extremity weakness, headaches and numbness.  Hematological:  Negative for adenopathy. Does not bruise/bleed easily.   Psychiatric/Behavioral:  Negative for depression. The patient is not nervous/anxious.   All other systems reviewed and are negative.     PHYSICAL EXAMINATION   General appearance: Alert, oriented in no acute distess  Bilateral breast examined, left breast with posttreatment changes.  No palpable masses or regional adenopathy Heart: RRR Abdomen: Soft, non tender, non distended, no organomegaly. No LE edema  LABORATORY DATA:  CBC    Component Value Date/Time   WBC 5.1 10/16/2023 1342   RBC 3.96 10/16/2023 1342   HGB 10.7 (L) 10/16/2023 1342   HGB 8.8 (L) 07/16/2023 1303   HGB 10.0 (L) 08/01/2016 1016   HCT 34.7 (L) 10/16/2023 1342   HCT 30.1 (L) 08/01/2016 1016   PLT 244 10/16/2023 1342   PLT 284 07/16/2023 1303   PLT 239 08/01/2016 1016   MCV 87.6 10/16/2023 1342   MCV 90.2 08/01/2016 1016   MCH 27.0 10/16/2023 1342   MCHC 30.8 10/16/2023 1342   RDW 18.7 (H) 10/16/2023 1342   RDW 13.5 08/01/2016 1016   LYMPHSABS 0.7 10/16/2023 1342   LYMPHSABS 0.9 08/01/2016 1016   MONOABS 0.5 10/16/2023 1342   MONOABS 0.5 08/01/2016 1016   EOSABS 0.3 10/16/2023 1342   EOSABS 0.1 08/01/2016 1016   BASOSABS 0.0 10/16/2023 1342   BASOSABS 0.0 08/01/2016 1016    CMP     Component Value Date/Time   NA 140 07/16/2023 1303   NA 143 08/01/2016 1016   K 3.7 07/16/2023 1303   K 3.8 08/01/2016 1016   CL 105 07/16/2023 1303   CO2 30 07/16/2023 1303   CO2 29 08/01/2016 1016   GLUCOSE 91 07/16/2023 1303   GLUCOSE 77 08/01/2016 1016   BUN 18 07/16/2023 1303   BUN 13.6 08/01/2016 1016   CREATININE 0.75 07/16/2023 1303   CREATININE 0.8 08/01/2016 1016   CALCIUM 9.2 07/16/2023 1303   CALCIUM 9.5 08/01/2016 1016   PROT 6.8 07/16/2023 1303   PROT 7.0 08/01/2016 1016   ALBUMIN 4.0 07/16/2023 1303   ALBUMIN 3.6 08/01/2016 1016   AST 15 07/16/2023 1303   AST 18 08/01/2016 1016   ALT 8 07/16/2023 1303   ALT 13 08/01/2016 1016   ALKPHOS 64 07/16/2023 1303   ALKPHOS 45 08/01/2016 1016    BILITOT 0.6 07/16/2023 1303   BILITOT 0.44 08/01/2016 1016   GFRNONAA >60 07/16/2023 1303   GFRAA >60 10/02/2019 0957   GFRAA >60 08/06/2018 1044   ASSESSMENT and THERAPY PLAN:   This is a very pleasant 78 year old female patient with history of left breast cancer status postlumpectomy radiation and antiestrogen therapy, also has iron  deficiency from recurrent GI bleed secondary to GAVE who is here for follow-up.   Assessment and Plan Assessment & Plan Blood loss anemia. Anemia improving post-radiofrequency ablation from GAVE. Hemoglobin at 10.7.  - Ok to continue B12 supplementation - Follow-up in six months to monitor hemoglobin and hematocrit or sooner as needed.  Gastrointestinal bleeding Intermittent bleeding with decreased frequency post-ablation. No significant bleeding for nine months. Continue to monitor, follow up with GI PRN.  Breast cancer Breast cancer post-radiation. Recent mammogram normal. No new findings. No concerns on exam. - Follow-up mammogram in one year.  Breast itching and scaling Persistent itching and scaling managed with castor oil. No new lumps or abnormalities.  Murleen Arms MD

## 2023-11-10 ENCOUNTER — Encounter: Payer: Self-pay | Admitting: Hematology and Oncology

## 2023-11-10 NOTE — Telephone Encounter (Signed)
 Called to confirm appt for 5/29

## 2023-12-05 ENCOUNTER — Other Ambulatory Visit: Payer: Self-pay | Admitting: Family Medicine

## 2023-12-22 IMAGING — CT CTA GI BLEED
3 of 12 series · 10 of 46 positions shown, 16 images · IV contrast (OMNIPAQUE 350)
Comparison: 10/19/2003

CLINICAL DATA: 75-year-old with abdominal pain. Evaluate for
mesenteric ischemia, acute.

EXAM:
CTA ABDOMEN AND PELVIS WITHOUT AND WITH CONTRAST
TECHNIQUE: Multidetector CT imaging of the abdomen and pelvis was performed
using the standard protocol during bolus administration of
intravenous contrast. Multiplanar reconstructed images and MIPs were
obtained and reviewed to evaluate the vascular anatomy.

[Series 7: axial arterial · axial · arterial · 0.71mm/px · z∈[+1080,+1244]mm · 4 of 215 slices shown]
[im 17/215  soft-tissue]
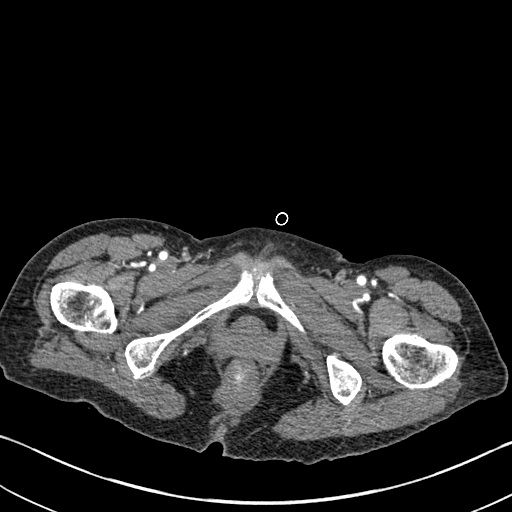
[im 50/215  soft-tissue]
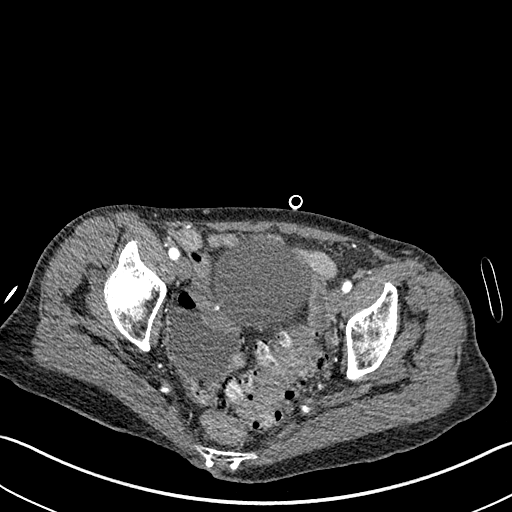
[im 66/215  soft-tissue]
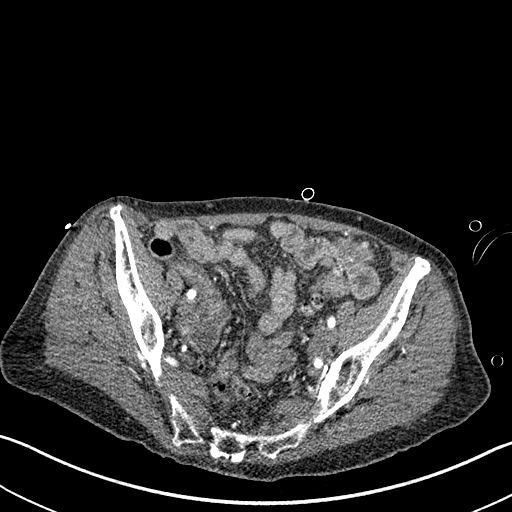
[im 99/215  soft-tissue]
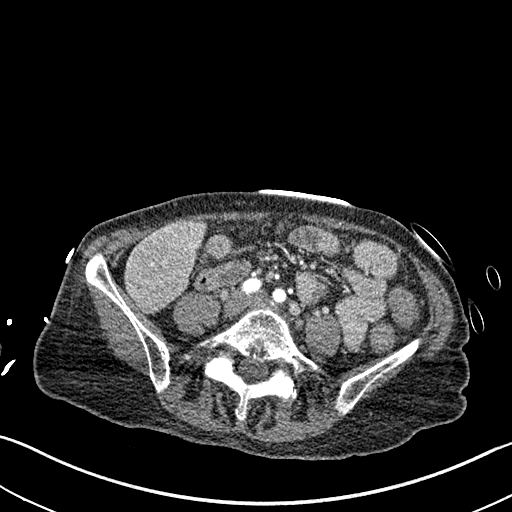

[Series 9: coronal arterial · coronal · arterial · 0.67mm/px · 2 of 104 slices shown, 3 images]
[im 35/104  soft-tissue]
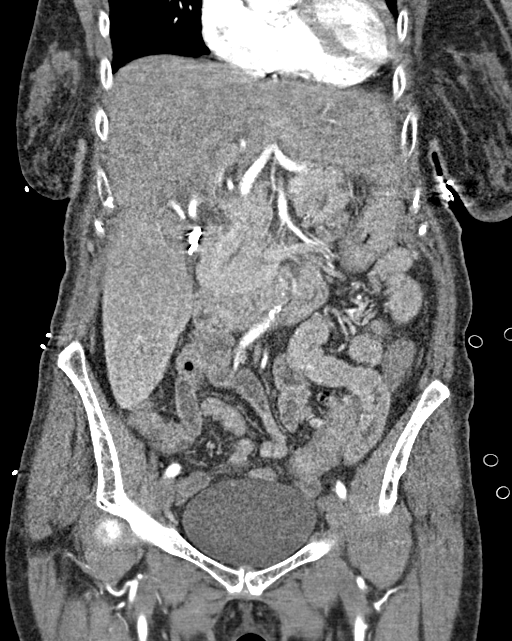
[im 35/104  bone]
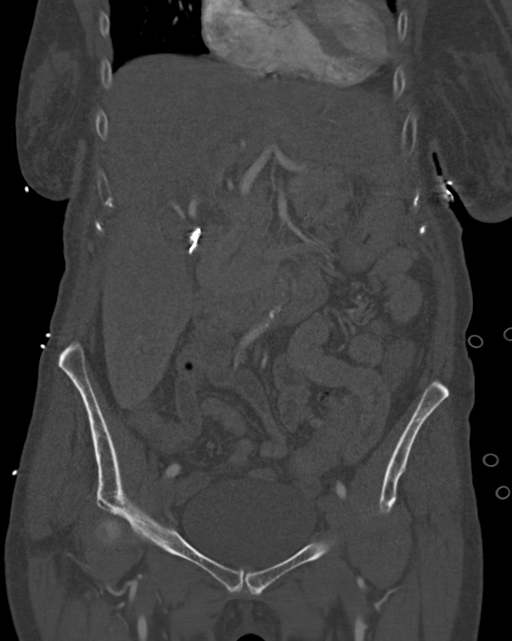
[im 69/104  soft-tissue]
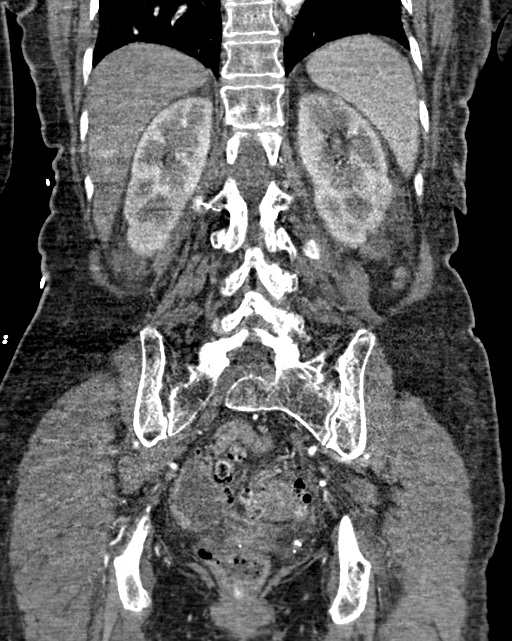

[Series 13: axial portal venous · axial · portal-venous · 0.72mm/px · z∈[+1132,+1387]mm · 4 of 87 slices shown, 9 images]
[im 18/87  soft-tissue]
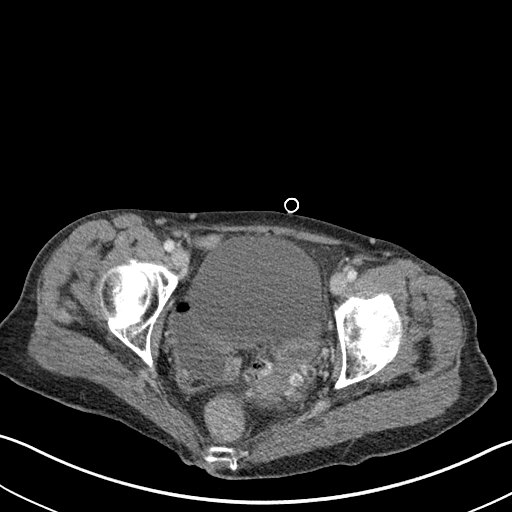
[im 18/87  lung]
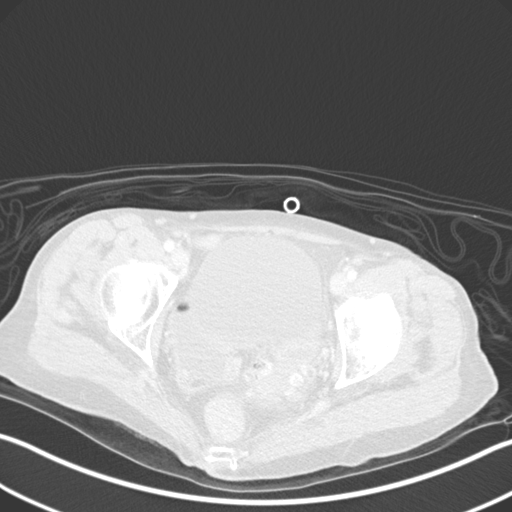
[im 18/87  bone]
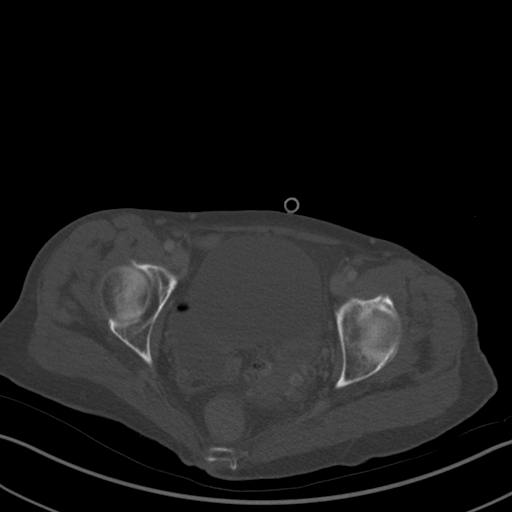
[im 35/87  soft-tissue]
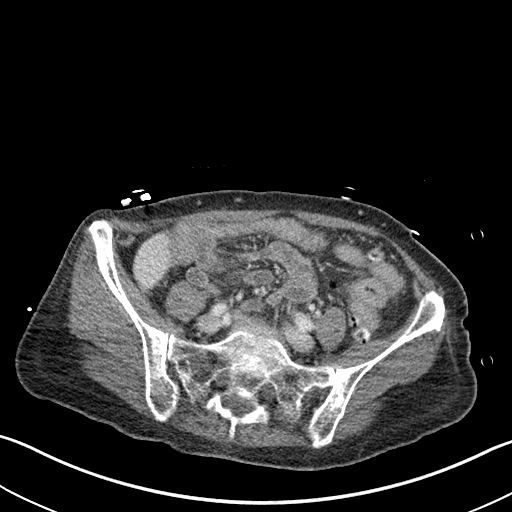
[im 35/87  lung]
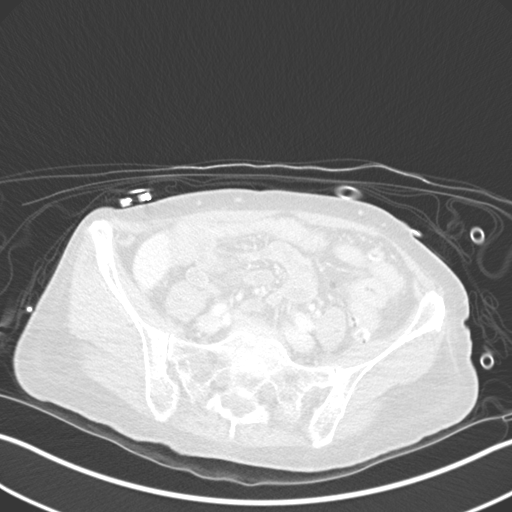
[im 52/87  soft-tissue]
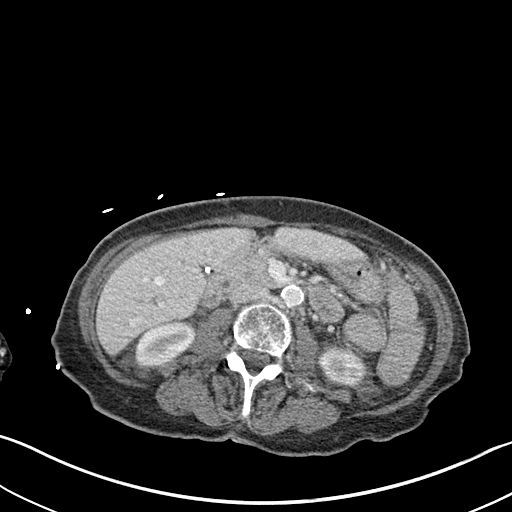
[im 52/87  lung]
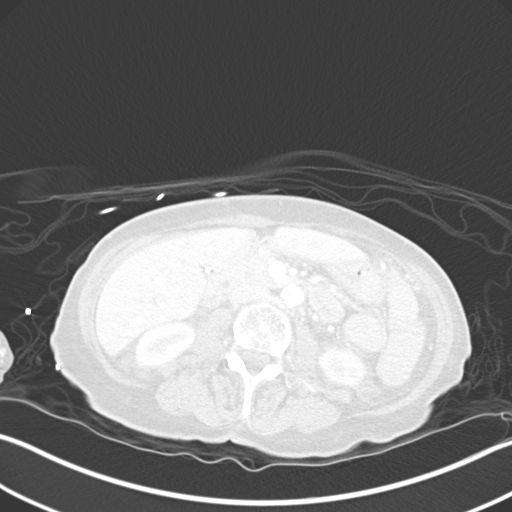
[im 69/87  soft-tissue]
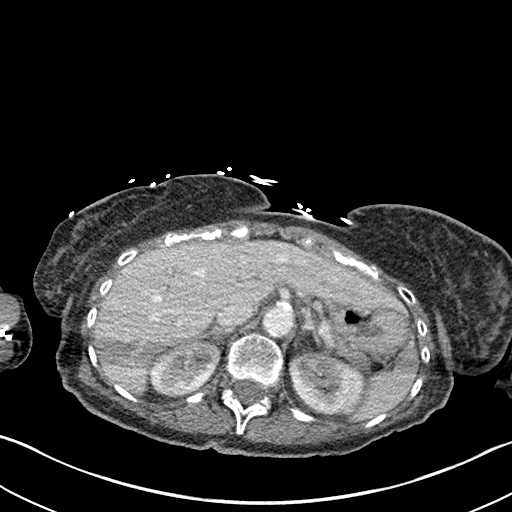
[im 69/87  lung]
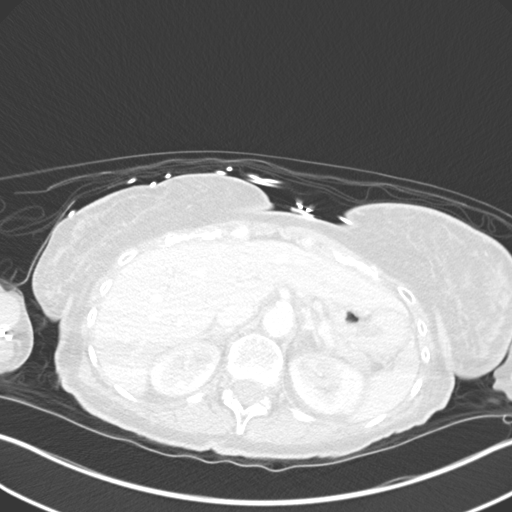

[10 of 46 positions shown; findings below may reference images not displayed]

RADIATION DOSE REDUCTION: This exam was performed according to the
departmental dose-optimization program which includes automated
exposure control, adjustment of the mA and/or kV according to
patient size and/or use of iterative reconstruction technique.

CONTRAST:  80mL OMNIPAQUE IOHEXOL 350 MG/ML SOLN
FINDINGS: VASCULAR

Aorta: Atherosclerotic calcifications in the abdominal aorta without
aneurysm, dissection or significant stenosis.

Celiac: Patent without evidence of aneurysm, dissection, vasculitis
or significant stenosis.

SMA: Patent without evidence of aneurysm, dissection, vasculitis or
significant stenosis.

Renals: Single left renal artery is widely patent. Main right renal
artery is widely patent. There is an accessory right renal artery.
No evidence for aneurysm or dissection involving the renal arteries.

IMA: Patent without evidence of aneurysm, dissection, vasculitis or
significant stenosis.

Inflow: Right common, internal and external iliac arteries are
widely patent. There appears to be a small saccular aneurysm
involving proximal right internal iliac artery at branch point on
sequence 7, image 148. This small saccular aneurysm measures roughly
7 mm. Left common, internal and external iliac arteries are widely
patent.

Proximal Outflow: Proximal femoral arteries are widely patent.

Veins: Hepatic veins and portal veins are patent. No gross
abnormality to the IVC or iliac veins.

Review of the MIP images confirms the above findings.

NON-VASCULAR

Lower chest: 4 mm nodule in the right middle lobe on sequence 4
image 14. Tiny calcified granuloma in the right lower lobe on image
24, sequence 4. Patchy dependent densities at the lung bases are
suggestive for atelectasis. Multiple surgical clips in the left
breast.

Hepatobiliary: Cholecystectomy. Mild periportal edema versus minimal
intrahepatic biliary dilatation. Common bile duct is not dilated.
Subtle hypodensity near the right hepatic dome on sequence 13, image
8 is nonspecific. Focal enhancement in central aspect of the liver
on sequence 7 image 31 is nonspecific and likely incidental vascular
lesion or perfusion abnormality.

Pancreas: Unremarkable. No pancreatic ductal dilatation or
surrounding inflammatory changes.

Spleen: Normal in size without focal abnormality.

Adrenals/Urinary Tract: Limited evaluation of the adrenal glands.
There is no significant adrenal enlargement. Round low-density
structure along the right kidney upper pole probably represents a
renal cyst measuring up to 1.5 cm. No hydronephrosis. Mild
perinephric stranding or edema along the posterior aspect of both
kidneys which is nonspecific. Negative for kidney stones. No
evidence for a ureter stone. Urinary bladder is mildly distended.

Stomach/Bowel: Severe diverticulosis in the sigmoid colon. Cecum is
located in the right hemipelvis. There appears to be a normal
appendix containing gas. No evidence for bowel dilatation or focal
bowel inflammation. There is no evidence for active GI bleeding.

Lymphatic: No lymph node enlargement in the abdomen or pelvis.

Reproductive: Status post hysterectomy. No adnexal masses.

Other: Negative for free fluid or free air.

Musculoskeletal: Disc space narrowing at L4-L5. No acute bone
abnormality.
IMPRESSION: VASCULAR

1. Visceral arteries in the abdomen and pelvis are widely patent. No
evidence for acute or chronic mesenteric ischemia.
2. Small saccular aneurysm coming off the right internal iliac
artery measuring up to 7 mm. Recommend follow-up CTA in 6-12 months
to ensure stability.
3.  Aortic Atherosclerosis (XIMH2-V07.7).

NON-VASCULAR

1. No evidence for active GI bleeding.
2. Extensive colonic diverticulosis without acute inflammation.
3. Perinephric edema or stranding along the posteroinferior aspect
of both kidneys. Findings are nonspecific and age-indeterminate.
Probable cyst in the right kidney upper pole.
4. Cholecystectomy. Mild intrahepatic biliary dilatation versus mild
periportal edema.
5. Dependent changes in both lung bases, likely related atelectasis.
6. Indeterminate 4 mm nodule in the right middle lobe. No follow-up
needed if patient is low-risk. Non-contrast chest CT can be
considered in 12 months if patient is high-risk. This recommendation
follows the consensus statement: Guidelines for Management of
Incidental Pulmonary Nodules Detected on CT Images: From the

## 2024-01-05 ENCOUNTER — Ambulatory Visit (INDEPENDENT_AMBULATORY_CARE_PROVIDER_SITE_OTHER): Payer: Medicare Other

## 2024-01-05 VITALS — Ht 65.0 in | Wt 116.0 lb

## 2024-01-05 DIAGNOSIS — K635 Polyp of colon: Secondary | ICD-10-CM

## 2024-01-05 DIAGNOSIS — Z0000 Encounter for general adult medical examination without abnormal findings: Secondary | ICD-10-CM | POA: Diagnosis not present

## 2024-01-05 NOTE — Progress Notes (Addendum)
 Subjective:  Please attest and cosign this visit due to patients primary care provider not being in the office at the time the visit was completed.  (Pt of Dr Arlyss Solian)   Kristi Carr is a 78 y.o. who presents for a Medicare Wellness preventive visit.  As a reminder, Annual Wellness Visits don't include a physical exam, and some assessments may be limited, especially if this visit is performed virtually. We may recommend an in-person follow-up visit with your provider if needed.  Visit Complete: Virtual I connected with  Kristi Carr on 01/05/24 by a audio enabled telemedicine application and verified that I am speaking with the correct person using two identifiers.  Patient Location: Home  Provider Location: Office/Clinic  I discussed the limitations of evaluation and management by telemedicine. The patient expressed understanding and agreed to proceed.  Vital Signs: Because this visit was a virtual/telehealth visit, some criteria may be missing or patient reported. Any vitals not documented were not able to be obtained and vitals that have been documented are patient reported.  VideoDeclined- This patient declined Librarian, academic. Therefore the visit was completed with audio only.  Persons Participating in Visit: Patient.  AWV Questionnaire: No: Patient Medicare AWV questionnaire was not completed prior to this visit.  Cardiac Risk Factors include: advanced age (>38men, >14 women);dyslipidemia;hypertension     Objective:    Today's Vitals   01/05/24 1458  Weight: 116 lb (52.6 kg)  Height: 5' 5 (1.651 m)   Body mass index is 19.3 kg/m.     01/05/2024    2:58 PM 09/01/2023    6:50 AM 08/02/2023    8:12 AM 12/30/2022    2:31 PM 12/27/2021   11:32 AM 07/18/2021    5:03 AM 03/05/2021   12:27 PM  Advanced Directives  Does Patient Have a Medical Advance Directive? Yes Yes Yes Yes Yes No Yes  Type of Estate agent of  [place];Living will Healthcare Power of [place];Living will  Healthcare Power of [place];Living will Healthcare Power of [place];Living will  Healthcare Power of [place];Living will  Does patient want to make changes to medical advance directive? No - Patient declined  No - Patient declined No - Patient declined Yes (Inpatient - patient defers changing a medical advance directive and declines information at this time)  No - Patient declined  Copy of Healthcare Power of Attorney in Chart? Yes - validated most recent copy scanned in chart (See row information) Yes - validated most recent copy scanned in chart (See row information)  Yes - validated most recent copy scanned in chart (See row information) Yes - validated most recent copy scanned in chart (See row information)  Yes - validated most recent copy scanned in chart (See row information)  Would patient like information on creating a medical advance directive?      No - Patient declined No - Patient declined    Current Medications (verified) Outpatient Encounter Medications as of 01/05/2024  Medication Sig   amLODipine  (NORVASC ) 2.5 MG tablet TAKE 1-2 TABLETS (2.5-5 MG TOTAL) BY MOUTH DAILY.   Cholecalciferol (VITAMIN D ) 50 MCG (2000 UT) CAPS Take 2,000 Units by mouth daily.   cyanocobalamin  (VITAMIN B12) 1000 MCG tablet Take 1,000 mcg by mouth 2 (two) times daily.   dicyclomine  (BENTYL ) 10 MG capsule Take 1 capsule (10 mg total) by mouth as needed for spasms.   diphenoxylate -atropine  (LOMOTIL ) 2.5-0.025 MG tablet Take 1 tablet by mouth 4 (four) times daily  as needed for diarrhea or loose stools.   metoprolol  succinate (TOPROL -XL) 50 MG 24 hr tablet Take 1 tablet (50 mg total) by mouth daily. TAKE WITH OR IMMEDIATELY FOLLOWING A MEAL.   omeprazole  (PRILOSEC) 40 MG capsule TAKE 1 CAPSULE (40 MG TOTAL) BY MOUTH DAILY.   No facility-administered encounter medications on file as of 01/05/2024.    Allergies (verified) Clindamycin/lincomycin    History: Past Medical History:  Diagnosis Date   Anemia    Breast cancer of upper-outer quadrant of left female breast (HCC)    C. difficile diarrhea    Cataract    Colon polyps    Gallstones 08/23/2003   x 2   Gastric hemorrhage due to gastric antral vascular ectasia (GAVE)    s/p radiofrequency ablation    GAVE (gastric antral vascular ectasia)    Hot flashes    Hypertension    Kidney stones    Radiation 10/14/13-12/02/13   left breast 50.4 gray, lumpectomy cavity boosted to 63 gray   Wears glasses    Past Surgical History:  Procedure Laterality Date   BREAST LUMPECTOMY WITH SENTINEL LYMPH NODE BIOPSY Left 09/06/2013   LEFT BREAST SEED GUIDED LUMPECTOMY WITH LEFT AXILLARY SENTINEL NODE BIOPSY (Left)   CATARACT EXTRACTION, BILATERAL Bilateral    right-07/31/22, left-08/14/22   CHOLECYSTECTOMY     COLONOSCOPY     ESOPHAGOGASTRODUODENOSCOPY N/A 09/01/2023   Procedure: EGD (ESOPHAGOGASTRODUODENOSCOPY);  Surgeon: Federico Rosario BROCKS, MD;  Location: [facility] ENDOSCOPY;  Service: Gastroenterology;  Laterality: N/A;  EGD with RFA for GAVE   GI RADIOFREQUENCY ABLATION  09/01/2023   Procedure: RADIOFREQUENCY ABLATION, UPPER GI TRACT, ENDOSCOPIC;  Surgeon: Federico Rosario BROCKS, MD;  Location: [facility] ENDOSCOPY;  Service: Gastroenterology;;   PARTIAL HYSTERECTOMY  05/20/1978   ovaries left in   TONSILLECTOMY     TUBAL LIGATION     WISDOM TOOTH EXTRACTION     Family History  Problem Relation Age of Onset   Diabetes Mother    Hyperlipidemia Mother    Hypertension Mother    Breast cancer Mother    Heart attack Father    Heart disease Father    Hypertension Sister    Diabetes Sister    Heart disease Brother        CHF  EF 10%   Hypertension Brother    Gout Brother    Depression Neg Hx    Alcohol abuse Neg Hx    Drug abuse Neg Hx    Colon cancer Neg Hx    Uterine cancer Neg Hx    Ovarian cancer Neg Hx    Prostate cancer Neg Hx    Social History   Socioeconomic History   Marital status:  Married    Spouse name: Not on file   Number of children: 2   Years of education: Not on file   Highest education level: Not on file  Occupational History   Occupation: Retired from [employer] in 2008  Tobacco Use   Smoking status: Never   Smokeless tobacco: Never  Vaping Use   Vaping status: Never Used  Substance and Sexual Activity   Alcohol use: No   Drug use: No   Sexual activity: Not Currently  Other Topics Concern   Not on file  Social History Narrative   Married 1966   2 kids   4 grand children   Retired from Phot[employer]   Social Drivers of Health   Financial Resource Strain: Low Risk  (01/05/2024)   Overall Physicist, medical Strain (  CARDIA)    Difficulty of Paying Living Expenses: Not hard at all  Food Insecurity: No Food Insecurity (01/05/2024)   Hunger Vital Sign    Worried About Running Out of Food in the Last Year: Never true    Ran Out of Food in the Last Year: Never true  Transportation Needs: No Transportation Needs (01/05/2024)   PRAPARE - Administrator, Civil Service (Medical): No    Lack of Transportation (Non-Medical): No  Physical Activity: Sufficiently Active (01/05/2024)   Exercise Vital Sign    Days of Exercise per Week: 7 days    Minutes of Exercise per Session: 30 min  Stress: No Stress Concern Present (01/05/2024)   [employer] of Occupational Health - Occupational Stress Questionnaire    Feeling of Stress: Not at all  Social Connections: Moderately Isolated (01/05/2024)   Social Connection and Isolation Panel    Frequency of Communication with Friends and Family: More than three times a week    Frequency of Social Gatherings with Friends and Family: More than three times a week    Attends Religious Services: Never    Database administrator or Organizations: No    Attends Engineer, structural: Never    Marital Status: Married    Tobacco Counseling Counseling given: No    Clinical Intake:  Pre-visit  preparation completed: Yes  Pain : No/denies pain     BMI - recorded: 19.3 Nutritional Status: BMI <19  Underweight Nutritional Risks: None Diabetes: No  No results found for: HGBA1C   How often do you need to have someone help you when you read instructions, pamphlets, or other written materials from your doctor or pharmacy?: 1 - Never  Interpreter Needed?: No  Information entered by :: Kristi Carr, CMA   Activities of Daily Living     01/05/2024    3:00 PM  In your present state of health, do you have any difficulty performing the following activities:  Hearing? 0  Vision? 0  Difficulty concentrating or making decisions? 0  Walking or climbing stairs? 0  Dressing or bathing? 0  Doing errands, shopping? 0  Preparing Food and eating ? N  Using the Toilet? N  In the past six months, have you accidently leaked urine? Y  Comment wears a pantyliner  Do you have problems with loss of bowel control? N  Managing your Medications? N  Managing your Finances? N  Housekeeping or managing your Housekeeping? N    Patient Care Team: [name], MD as PCP - General (Family Medicine) Estelle Service, MD as Consulting Physician (Obstetrics and Gynecology) Paul Barrio, [place] (Optometry) Ebbie Cough, MD as Consulting Physician (General Surgery) Shannon Agent, MD as Consulting Physician (Radiation Oncology) Luis Purchase, MD as Consulting Physician (Gastroenterology) Loretha Ash, MD as Consulting Physician (Hematology and Oncology) Crawford Morna Pickle, NP as Nurse Practitioner (Hematology and Oncology) Pa, [facility] Ophthalmology  I have updated your Care Teams any recent Medical Services you may have received from other providers in the past year.     Assessment:   This is a routine wellness examination for Kristi Carr.  Hearing/Vision screen Hearing Screening - Comments:: Denies hearing difficulties   Vision Screening - Comments:: Wears rx glasses - up  to date with routine eye exams with [facility]   Goals Addressed               This Visit's Progress     Patient Stated (pt-stated)  Patient stated she plans to continue walking and helping sick Spouse       Depression Screen     01/05/2024    3:01 PM 12/30/2022    2:29 PM 06/25/2022    9:07 AM 12/27/2021   11:30 AM 12/20/2020    1:16 PM 12/28/2019   10:37 AM 12/18/2018   10:05 AM  PHQ 2/9 Scores  PHQ - 2 Score 0 0 0 0 0 0 0  PHQ- 9 Score 0  0 0 0      Fall Risk     01/05/2024    3:01 PM 12/30/2022    2:32 PM 06/25/2022    9:07 AM 12/27/2021   11:33 AM 12/20/2020    1:15 PM  Fall Risk   Falls in the past year? 0 0 0 0 0  Number falls in past yr: 0 0 0 0 0  Injury with Fall? 0 0 0 0 0  Risk for fall due to : No Fall Risks No Fall Risks No Fall Risks No Fall Risks No Fall Risks  Follow up Falls evaluation completed;Falls prevention discussed Falls prevention discussed;Falls evaluation completed Falls evaluation completed Falls evaluation completed  Falls evaluation completed;Falls prevention discussed      Data saved with a previous flowsheet row definition    MEDICARE RISK AT HOME:  Medicare Risk at Home Any stairs in or around the home?: Yes If so, are there any without handrails?: No Home free of loose throw rugs in walkways, pet beds, electrical cords, etc?: Yes Adequate lighting in your home to reduce risk of falls?: Yes Life alert?: No Use of a cane, walker or w/c?: No Grab bars in the bathroom?: Yes Shower chair or bench in shower?: Yes Elevated toilet seat or a handicapped toilet?: Yes  TIMED UP AND GO:  Was the test performed?  No  Cognitive Function: 6CIT completed    12/20/2020    1:17 PM 12/08/2017    8:44 AM 12/04/2016   10:43 AM  MMSE - Mini Mental State Exam  Orientation to time 5 5 5    Orientation to Place 5 5 5    Registration 3 3 3    Attention/ Calculation 5 0 0   Recall 3 3 2    Language- name 2 objects  0 0   Language- repeat 1 1 1    Language- follow 3 step command  3 3   Language- read & follow direction  0 0   Write a sentence  0 0   Copy design  0 0   Total score  20 19      Data saved with a previous flowsheet row definition        01/05/2024    3:04 PM 12/30/2022    2:33 PM 12/27/2021   11:34 AM  6CIT Screen  What Year? 0 points 0 points 0 points  What month? 0 points 0 points 0 points  What time? 0 points 0 points 0 points  Count back from 20 0 points 0 points 0 points  Months in reverse 0 points 0 points 0 points  Repeat phrase 0 points 0 points 2 points  Total Score 0 points 0 points 2 points    Immunizations Immunization History  Administered Date(s) Administered   Fluad Quad(high Dose 65+) 01/29/2022   Fluad Trivalent(High Dose 65+) 02/19/2023   Influenza Split 04/10/2013, 05/23/2015, 03/10/2017   Influenza Whole 04/10/2013   Influenza, High Dose Seasonal PF 02/23/2018, 02/08/2019   Influenza,inj,Quad PF,6+ Mos 05/23/2015,  03/10/2017   Influenza,inj,quad, With Preservative 02/27/2021   Influenza-Unspecified 05/23/2015, 03/10/2017   PFIZER(Purple Top)SARS-COV-2 Vaccination 06/25/2019, 07/16/2019, 05/17/2020   Pneumococcal Conjugate-13 05/23/2015   Pneumococcal Polysaccharide-23 11/02/2012   Tdap 12/05/2010   Zoster Recombinant(Shingrix) 01/11/2019, 05/17/2019   Zoster, Live 06/22/2008, 01/11/2019, 05/17/2019    Screening Tests Health Maintenance  Topic Date Due   Zoster Vaccines- Shingrix (2 of 2) 07/12/2019   DTaP/Tdap/Td (2 - Td or Tdap) 12/04/2020   INFLUENZA VACCINE  12/19/2023   Colonoscopy  04/04/2024   Medicare Annual Wellness (AWV)  01/04/2025   Pneumococcal Vaccine: 50+ Years  Completed   DEXA SCAN  Completed   Hepatitis C Screening  Completed   HPV VACCINES  Aged Out   Meningococcal B Vaccine  Aged Out   COVID-19 Vaccine  Discontinued    Health Maintenance  Health Maintenance Due  Topic Date Due   Zoster Vaccines- Shingrix (2 of 2) 07/12/2019   DTaP/Tdap/Td (2 - Td  or Tdap) 12/04/2020   INFLUENZA VACCINE  12/19/2023   Colonoscopy  04/04/2024   Health Maintenance Items Addressed:  Referral sent to GI for colonoscopy - h/o colon polyp  Additional Screening:  Vision Screening: Recommended annual ophthalmology exams for early detection of glaucoma and other disorders of the eye. Would you like a referral to an eye doctor? No    Dental Screening: Recommended annual dental exams for proper oral hygiene  Community Resource Referral / Chronic Care Management: CRR required this visit?  No   CCM required this visit?  No   Plan:    I have personally reviewed and noted the following in the patient's chart:   Medical and social history Use of alcohol, tobacco or illicit drugs  Current medications and supplements including opioid prescriptions. Patient is not currently taking opioid prescriptions. Functional ability and status Nutritional status Physical activity Advanced directives List of other physicians Hospitalizations, surgeries, and ER visits in previous 12 months Vitals Screenings to include cognitive, depression, and falls Referrals and appointments  In addition, I have reviewed and discussed with patient certain preventive protocols, quality metrics, and best practice recommendations. A written personalized care plan for preventive services as well as general preventive health recommendations were provided to patient.   Kristi Kristi Carr, CMA   01/05/2024   After Visit Summary: (MyChart) Due to this being a telephonic visit, the after visit summary with patients personalized plan was offered to patient via MyChart   Notes: Nothing significant to report at this time.

## 2024-01-05 NOTE — Patient Instructions (Addendum)
 Kristi Carr , Thank you for taking time out of your busy schedule to complete your Annual Wellness Visit with me. I enjoyed our conversation and look forward to speaking with you again next year. I, as well as your care team,  appreciate your ongoing commitment to your health goals. Please review the following plan we discussed and let me know if I can assist you in the future. Your Game plan/ To Do List    Referrals: If you haven't heard from the office you've been referred to, please reach out to them at the phone provided. Referral for a repeat Colonoscopy  Follow up Visits: We will see or speak with you next year for your Next Medicare AWV with our clinical staff Have you seen your provider in the last 6 months (3 months if uncontrolled diabetes)? No  Clinician Recommendations:  Aim for 30 minutes of exercise or brisk walking, 6-8 glasses of water, and 5 servings of fruits and vegetables each day. Educated and advised on getting the Tdap vaccine.      This is a list of the screenings recommended for you:  Health Maintenance  Topic Date Due   Zoster (Shingles) Vaccine (2 of 2) 07/12/2019   DTaP/Tdap/Td vaccine (2 - Td or Tdap) 12/04/2020   Flu Shot  12/19/2023   Colon Cancer Screening  04/04/2024   Medicare Annual Wellness Visit  01/04/2025   Pneumococcal Vaccine for age over 42  Completed   DEXA scan (bone density measurement)  Completed   Hepatitis C Screening  Completed   HPV Vaccine  Aged Out   Meningitis B Vaccine  Aged Out   COVID-19 Vaccine  Discontinued    Advanced directives: (In Chart) A copy of your advanced directives are scanned into your chart should your provider ever need it. Advance Care Planning is important because it:  [x]  Makes sure you receive the medical care that is consistent with your values, goals, and preferences  [x]  It provides guidance to your family and loved ones and reduces their decisional burden about whether or not they are making the right  decisions based on your wishes.  Follow the link provided in your after visit summary or read over the paperwork we have mailed to you to help you started getting your Advance Directives in place. If you need assistance in completing these, please reach out to us  so that we can help you!

## 2024-01-22 ENCOUNTER — Ambulatory Visit (INDEPENDENT_AMBULATORY_CARE_PROVIDER_SITE_OTHER): Admitting: Family Medicine

## 2024-01-22 ENCOUNTER — Encounter: Payer: Self-pay | Admitting: Family Medicine

## 2024-01-22 VITALS — BP 132/62 | HR 66 | Temp 98.6°F | Ht 64.65 in | Wt 117.0 lb

## 2024-01-22 DIAGNOSIS — E041 Nontoxic single thyroid nodule: Secondary | ICD-10-CM

## 2024-01-22 DIAGNOSIS — I10 Essential (primary) hypertension: Secondary | ICD-10-CM

## 2024-01-22 DIAGNOSIS — E538 Deficiency of other specified B group vitamins: Secondary | ICD-10-CM | POA: Diagnosis not present

## 2024-01-22 DIAGNOSIS — D508 Other iron deficiency anemias: Secondary | ICD-10-CM

## 2024-01-22 DIAGNOSIS — E559 Vitamin D deficiency, unspecified: Secondary | ICD-10-CM

## 2024-01-22 DIAGNOSIS — Z0000 Encounter for general adult medical examination without abnormal findings: Secondary | ICD-10-CM

## 2024-01-22 DIAGNOSIS — Z7189 Other specified counseling: Secondary | ICD-10-CM

## 2024-01-22 MED ORDER — METOPROLOL SUCCINATE ER 50 MG PO TB24: 50.0000 mg | ORAL_TABLET | Freq: Every day | ORAL | 3 refills | Status: AC

## 2024-01-22 MED ORDER — DICYCLOMINE HCL 10 MG PO CAPS
10.0000 mg | ORAL_CAPSULE | Freq: Every day | ORAL | 3 refills | Status: AC
Start: 2024-01-22 — End: ?

## 2024-01-22 NOTE — Patient Instructions (Addendum)
Thanks for your effort.   Go to the lab on the way out.   If you have mychart we'll likely use that to update you.    Take care.  Glad to see you. I would get a flu shot each fall.

## 2024-01-22 NOTE — Progress Notes (Signed)
 GAVE.  No blood in stool noted recently by patient.  She had prev procedure with relief.  IBguard and dicyclomine  helped with abd discomfort. No ADE on those meds.     History of vitamin D  deficiency.  See notes on labs.  B12 def.  Still on replacement by mouth.  Compliant.  See notes on labs.  Hypertension:    Using medication without problems or lightheadedness: yes Chest pain with exertion:no Edema:no Short of breath:no She is taking 2.5mg  amlodipine  at night.  Taking metoprolol  in the AM.  BP controlled at home.    She is still caring for her husband with some help 2 days a week, total of 8 hours per week.  D/w pt about his urinalysis report.  He isn't wandering at night.  He has memory loss.    Flu to be done at pharmacy.   Shingles prev done PNA up to date Covid vaccine prev done Tetanus 2012 RSV d/w pt.  Colonoscopy 2020 Breast cancer screening up to date Advance directive- both children equally designated if patient were incapacitated.  DXA done per outside clinic 2018 with osteoporosis.   She politely wanted to defer, d/w pt.    She had CT this year with stable 21 mm left thyroid nodule.  Discussed.  See following phone note.   Meds, vitals, and allergies reviewed.   ROS: Per HPI unless specifically indicated in ROS section   GEN: nad, alert and oriented HEENT: mucous membranes moist NECK: supple w/o LA CV: rrr PULM: ctab, no inc wob ABD: soft, +bs EXT: no edema SKIN: well perfused.

## 2024-01-23 ENCOUNTER — Other Ambulatory Visit: Payer: Self-pay | Admitting: Internal Medicine

## 2024-01-23 LAB — LIPID PANEL
Cholesterol: 173 mg/dL (ref 0–200)
HDL: 49.6 mg/dL (ref >39.00)
LDL Cholesterol: 102 mg/dL — ABNORMAL HIGH (ref 0–99)
NonHDL: 123.59
Total CHOL/HDL Ratio: 3
Triglycerides: 106 mg/dL (ref 0.0–149.0)
VLDL: 21.2 mg/dL (ref 0.0–40.0)

## 2024-01-23 LAB — COMPREHENSIVE METABOLIC PANEL WITH GFR
ALT: 8 U/L (ref 0–35)
AST: 16 U/L (ref 0–37)
Albumin: 4.2 g/dL (ref 3.5–5.2)
Alkaline Phosphatase: 52 U/L (ref 39–117)
BUN: 19 mg/dL (ref 6–23)
CO2: 32 meq/L (ref 19–32)
Calcium: 9.5 mg/dL (ref 8.4–10.5)
Chloride: 102 meq/L (ref 96–112)
Creatinine, Ser: 0.78 mg/dL (ref 0.40–1.20)
GFR: 73.31 mL/min (ref >60.00)
Glucose, Bld: 84 mg/dL (ref 70–99)
Potassium: 4 meq/L (ref 3.5–5.1)
Sodium: 143 meq/L (ref 135–145)
Total Bilirubin: 0.8 mg/dL (ref 0.2–1.2)
Total Protein: 6.9 g/dL (ref 6.0–8.3)

## 2024-01-23 LAB — CBC WITH DIFFERENTIAL/PLATELET
Basophils Absolute: 0 K/uL (ref 0.0–0.1)
Basophils Relative: 0.4 % (ref 0.0–3.0)
Eosinophils Absolute: 0.3 K/uL (ref 0.0–0.7)
Eosinophils Relative: 4.7 % (ref 0.0–5.0)
HCT: 30.5 % — ABNORMAL LOW (ref 36.0–46.0)
Hemoglobin: 10 g/dL — ABNORMAL LOW (ref 12.0–15.0)
Lymphocytes Relative: 16.1 % (ref 12.0–46.0)
Lymphs Abs: 0.9 K/uL (ref 0.7–4.0)
MCHC: 32.9 g/dL (ref 30.0–36.0)
MCV: 87.9 fl (ref 78.0–100.0)
Monocytes Absolute: 0.5 K/uL (ref 0.1–1.0)
Monocytes Relative: 8.7 % (ref 3.0–12.0)
Neutro Abs: 4 K/uL (ref 1.4–7.7)
Neutrophils Relative %: 70.1 % (ref 43.0–77.0)
Platelets: 289 K/uL (ref 150.0–400.0)
RBC: 3.47 Mil/uL — ABNORMAL LOW (ref 3.87–5.11)
RDW: 15 % (ref 11.5–15.5)
WBC: 5.8 K/uL (ref 4.0–10.5)

## 2024-01-23 LAB — FERRITIN: Ferritin: 18.5 ng/mL (ref 10.0–291.0)

## 2024-01-23 LAB — VITAMIN B12: Vitamin B-12: 543 pg/mL (ref 211–911)

## 2024-01-23 LAB — VITAMIN D 25 HYDROXY (VIT D DEFICIENCY, FRACTURES): VITD: 37.01 ng/mL (ref 30.00–100.00)

## 2024-01-25 ENCOUNTER — Telehealth: Payer: Self-pay | Admitting: Family Medicine

## 2024-01-25 ENCOUNTER — Ambulatory Visit: Payer: Self-pay | Admitting: Family Medicine

## 2024-01-25 DIAGNOSIS — E041 Nontoxic single thyroid nodule: Secondary | ICD-10-CM

## 2024-01-25 NOTE — Telephone Encounter (Signed)
 Please check with radiology. She had CT this year with stable 21 mm left thyroid nodule.  I did not see the thyroid nodule mentioned in previous films.  When was seen previously?  Please let me know.  Thanks.

## 2024-01-25 NOTE — Assessment & Plan Note (Signed)
 Flu to be done at pharmacy.   Shingles prev done PNA up to date Covid vaccine prev done Tetanus 2012 RSV d/w pt.  Colonoscopy 2020 Breast cancer screening up to date Advance directive- both children equally designated if patient were incapacitated.  DXA done per outside clinic 2018 with osteoporosis.   She politely wanted to defer, d/w pt.

## 2024-01-25 NOTE — Assessment & Plan Note (Signed)
 Continue B12 replacement by mouth.  See notes on labs.

## 2024-01-25 NOTE — Assessment & Plan Note (Signed)
 See notes on labs.

## 2024-01-25 NOTE — Assessment & Plan Note (Signed)
 She is taking 2.5mg  amlodipine  at night.  Taking metoprolol  in the AM.  BP controlled at home.   Continue as is.

## 2024-01-25 NOTE — Assessment & Plan Note (Addendum)
 She had CT this year with stable 21 mm left thyroid nodule.  Discussed.  See following phone note.

## 2024-01-25 NOTE — Assessment & Plan Note (Signed)
Advance directive- both children equally designated if patient were incapacitated.

## 2024-01-25 NOTE — Assessment & Plan Note (Signed)
 History of, history of GAVE. IBguard and dicyclomine  helped with abd discomfort. No ADE on those meds.    Continue as is.  See notes on labs.

## 2024-01-28 NOTE — Telephone Encounter (Signed)
 Please check with radiology [phone].  They should be able to take the call and ask a radiologist for input about this.  Thanks.

## 2024-01-28 NOTE — Telephone Encounter (Signed)
 Are you able to help me with this? I am not sure where to call. If you can guide me please

## 2024-01-29 NOTE — Telephone Encounter (Signed)
 You just call the read room the number is [phone]

## 2024-01-29 NOTE — Telephone Encounter (Signed)
 Spoke with Kristi Carr in radiology to see when the thyroid nodule was first mentioned or first appeared. She looked through previous reads and it did not show up until the 3/18 read

## 2024-02-01 NOTE — Telephone Encounter (Signed)
 Thank you.  I looked back at this but I need input from a radiologist.  Please have staff in the radiology department check with a radiologist to see if they can offer comparison about the thyroid nodule, specifically comparing the CT from 2025 to a previous CT.  Please let me know what you can find out.  Thanks.

## 2024-02-05 NOTE — Telephone Encounter (Signed)
 Disregard

## 2024-02-05 NOTE — Telephone Encounter (Signed)
 Per Lucie with cardiology information has been sent over to pre-op team at cardiology. Awaiting reply

## 2024-02-08 NOTE — Addendum Note (Signed)
 Addended by: [name] on: 02/08/2024 06:29 PM   Modules accepted: Orders

## 2024-02-08 NOTE — Telephone Encounter (Signed)
 Please check with patient.  I called radiology.  The thyroid nodule does need a follow-up ultrasound.  I think it makes sense to get that done.  I went ahead and put in the order and she should get a call about that.  In retrospect, the nodule was not easily seen on previous CTs.  It was more easily seen on the most recent CT and that was the basis for comparison.  I think that ultrasound would be the best way to evaluate this.  Thanks.

## 2024-02-09 NOTE — Telephone Encounter (Signed)
 Patient notified and will be awaiting the phone call

## 2024-02-24 ENCOUNTER — Ambulatory Visit (INDEPENDENT_AMBULATORY_CARE_PROVIDER_SITE_OTHER)

## 2024-02-24 DIAGNOSIS — Z23 Encounter for immunization: Secondary | ICD-10-CM | POA: Diagnosis not present

## 2024-02-24 NOTE — Progress Notes (Signed)
 Per orders of Dr. Arlyss Solian, injection of Flu Vaccine given by Nellie Hummer in right deltoid. Patient tolerated injection well.

## 2024-03-04 ENCOUNTER — Ambulatory Visit
Admission: RE | Admit: 2024-03-04 | Discharge: 2024-03-04 | Disposition: A | Discharge status: Home / Self Care | Source: Ambulatory Visit | Attending: Family Medicine | Admitting: Family Medicine

## 2024-03-04 DIAGNOSIS — E041 Nontoxic single thyroid nodule: Secondary | ICD-10-CM

## 2024-03-07 ENCOUNTER — Ambulatory Visit: Payer: Self-pay | Admitting: Family Medicine

## 2024-04-22 ENCOUNTER — Inpatient Hospital Stay: Attending: Hematology and Oncology

## 2024-04-22 ENCOUNTER — Inpatient Hospital Stay: Admitting: Hematology and Oncology

## 2024-04-22 ENCOUNTER — Other Ambulatory Visit (HOSPITAL_COMMUNITY): Payer: Self-pay | Admitting: Pharmacist

## 2024-04-22 ENCOUNTER — Telehealth (HOSPITAL_COMMUNITY): Payer: Self-pay

## 2024-04-22 ENCOUNTER — Encounter: Payer: Self-pay | Admitting: Hematology and Oncology

## 2024-04-22 VITALS — BP 149/71 | HR 69 | Temp 97.8°F | Resp 18 | Ht 64.0 in | Wt 112.6 lb

## 2024-04-22 DIAGNOSIS — Z853 Personal history of malignant neoplasm of breast: Secondary | ICD-10-CM | POA: Insufficient documentation

## 2024-04-22 DIAGNOSIS — D509 Iron deficiency anemia, unspecified: Secondary | ICD-10-CM | POA: Insufficient documentation

## 2024-04-22 DIAGNOSIS — Z923 Personal history of irradiation: Secondary | ICD-10-CM | POA: Diagnosis not present

## 2024-04-22 DIAGNOSIS — D508 Other iron deficiency anemias: Secondary | ICD-10-CM

## 2024-04-22 LAB — CBC WITH DIFFERENTIAL/PLATELET
Abs Immature Granulocytes: 0.02 K/uL (ref 0.00–0.07)
Basophils Absolute: 0 K/uL (ref 0.0–0.1)
Basophils Relative: 0 %
Eosinophils Absolute: 0.2 K/uL (ref 0.0–0.5)
Eosinophils Relative: 3 %
HCT: 27.5 % — ABNORMAL LOW (ref 36.0–46.0)
Hemoglobin: 8.5 g/dL — ABNORMAL LOW (ref 12.0–15.0)
Immature Granulocytes: 0 %
Lymphocytes Relative: 13 %
Lymphs Abs: 0.8 K/uL (ref 0.7–4.0)
MCH: 25.8 pg — ABNORMAL LOW (ref 26.0–34.0)
MCHC: 30.9 g/dL (ref 30.0–36.0)
MCV: 83.3 fL (ref 80.0–100.0)
Monocytes Absolute: 0.7 K/uL (ref 0.1–1.0)
Monocytes Relative: 11 %
Neutro Abs: 4.4 K/uL (ref 1.7–7.7)
Neutrophils Relative %: 73 %
Platelets: 295 K/uL (ref 150–400)
RBC: 3.3 MIL/uL — ABNORMAL LOW (ref 3.87–5.11)
RDW: 14.5 % (ref 11.5–15.5)
WBC: 6 K/uL (ref 4.0–10.5)
nRBC: 0 % (ref 0.0–0.2)

## 2024-04-22 LAB — IRON AND IRON BINDING CAPACITY (CC-WL,HP ONLY)
Iron: 32 ug/dL (ref 28–170)
Saturation Ratios: 7 % — ABNORMAL LOW (ref 10.4–31.8)
TIBC: 447 ug/dL (ref 250–450)
UIBC: 415 ug/dL

## 2024-04-22 LAB — FERRITIN: Ferritin: 9 ng/mL — ABNORMAL LOW (ref 11–307)

## 2024-04-22 NOTE — Telephone Encounter (Signed)
 Auth Submission: NO AUTH NEEDED Site of care: Site of care: [facility] Payer: [employer] Medicare Medication & CPT/J Code(s) submitted: Venofer  (Iron  Sucrose) J1756 Diagnosis Code: D50.9 Route of submission (phone, fax, portal): portal Phone # Fax # Auth type: Buy/Bill HB Units/visits requested: 300mg  x 3 doses Reference number: [id number] Approval from: 04/22/24 to 05/19/24

## 2024-04-22 NOTE — Progress Notes (Signed)
 [facility] Cancer Follow up:    Kristi Arlyss RAMAN, MD [address] [place] [address]   DIAGNOSIS:  Cancer Staging  History of left breast cancer Staging form: Breast, AJCC 7th Edition - Clinical: Stage IIA (T2, N0, cM0) - Unsigned Specimen type: Core Needle Biopsy Histopathologic type: 9931 Laterality: Left Staging comments: Staged at breast conference 08/11/13.  - Pathologic: No stage assigned - Unsigned Specimen type: Core Needle Biopsy Histopathologic type: 9931 Laterality: Left    SUMMARY OF ONCOLOGIC/HEMATOLOGIC HISTORY: [place] woman status post left breast biopsy 08/02/2013 for a clinical T2 N0, stage IIA invasive papillary breast cancer, grade 1, estrogen receptor and progesterone receptor both 100% positive, with an MIB-1 of 9% and no HER-2 amplification   (1) Status post left lumpectomy and sentinel lymph node sampling 09/06/2013 for a pT1c pN0, stage IA invasive ductal carcinoma, grade 1, repeat HER-2 again negative   (2) Adjuvant radiation completed 11/30/2013   (3) started tamoxifen  12/18/2013, completing 5 years April 2020   (4)  anemia: as of 01/09/2018, ferritin was 16.2,, iron  saturation, B12 and folate are normal as is the reticulocyte count and creatinine.              (a) REVIEW OF BLOOD FILM 01/09/2018 shows mild rouleaux, but no other abnormalities: Specifically there are no tailed poikilocytes or significant anisocytosis, no left shift in the white cell series, and no artifactual platelet counts             (b) ferritin on 07/26/2019 was 3.5 (severe deficiency)             (c) iron  sucrose started 08/18/2019, to be repeated x5, received 12/14/2019-01/11/2020, 06/14/2020-08/04/2020, 02/24/2021-03/12/2021             (d) GI workup:                        () colonoscopy 04/05/2019: diverticulosis                         () Diagnosed in 2021 with Gastric hemorrhage due to gastric antral vascular ectasia--sees Dr. Emeline for radiofrequency  ablation of the areas in question (last performed 11/2021)     CURRENT THERAPY: intermittent IV iron , b12 supplementation  INTERVAL HISTORY:  Discussed the use of AI scribe software for clinical note transcription with the patient, who gave verbal consent to proceed. History of Present Illness  Kristi Carr is a 78 year old female with anemia who presents for follow-up after radiofrequency ablation for GAVE and IDA  Her hemoglobin level is currently 8.5 g/dL. She has not noticed much bleeding recently. No recent episodes of blood in her stool or black stool, and no lightheadedness upon standing. She feels well actually.  She is currently taking vitamin B12. She has purchased iron  supplements but has not yet started taking them.  She mentions having a caregiver who stays with her husband, indicating a family commitment that may affect her scheduling for treatments.   Patient Active Problem List   Diagnosis Date Noted   Thyroid  nodule 01/25/2024   Abdominal pain 05/25/2023   Lung nodule seen on imaging study 05/01/2023   B12 deficiency 02/17/2023   Connective tissue disease 05/07/2022   Osteoarthritis 05/07/2022   Raynaud's disease 05/07/2022   Gastric antral vascular ectasia 08/20/2021   Lichen sclerosus of female genitalia 12/03/2019   Female bladder prolapse 12/03/2019   Osteopenia 12/03/2019  Diverticulosis 04/04/2019   Internal hemorrhoids 04/04/2019   Status post cholecystectomy 12/22/2018   History of adenomatous polyp of colon 09/16/2017   Sensorineural hearing loss (SNHL), bilateral 09/10/2017   IDA (iron  deficiency anemia) 07/31/2017   Murmur 07/06/2017   Recurrent Clostridium difficile diarrhea 03/11/2017   Healthcare maintenance 12/15/2016   Vaginal pessary in situ 02/14/2016   Advance care planning 05/23/2015   History of left breast cancer 08/11/2013   Diarrhea 11/04/2012   Medicare annual wellness visit, subsequent 11/04/2012   Hypercholesteremia 02/20/2011    HTN (hypertension) 12/19/2010   Vitamin D  deficiency 06/22/2008   RENAL CALCULUS 08/19/2003    is allergic to clindamycin/lincomycin.  MEDICAL HISTORY: Past Medical History:  Diagnosis Date   Anemia    Breast cancer of upper-outer quadrant of left female breast (HCC)    C. difficile diarrhea    Cataract    Colon polyps    Gallstones 08/23/2003   x 2   Gastric hemorrhage due to gastric antral vascular ectasia (GAVE)    s/p radiofrequency ablation    GAVE (gastric antral vascular ectasia)    Hot flashes    Hypertension    Kidney stones    Radiation 10/14/13-12/02/13   left breast 50.4 gray, lumpectomy cavity boosted to 63 gray   Wears glasses     SURGICAL HISTORY: Past Surgical History:  Procedure Laterality Date   BREAST LUMPECTOMY WITH SENTINEL LYMPH NODE BIOPSY Left 09/06/2013   LEFT BREAST SEED GUIDED LUMPECTOMY WITH LEFT AXILLARY SENTINEL NODE BIOPSY (Left)   CATARACT EXTRACTION, BILATERAL Bilateral    right-07/31/22, left-08/14/22   CHOLECYSTECTOMY     COLONOSCOPY     ESOPHAGOGASTRODUODENOSCOPY N/A 09/01/2023   Procedure: EGD (ESOPHAGOGASTRODUODENOSCOPY);  Surgeon: Federico Rosario BROCKS, MD;  Location: [facility] ENDOSCOPY;  Service: Gastroenterology;  Laterality: N/A;  EGD with RFA for GAVE   GI RADIOFREQUENCY ABLATION  09/01/2023   Procedure: RADIOFREQUENCY ABLATION, UPPER GI TRACT, ENDOSCOPIC;  Surgeon: Federico Rosario BROCKS, MD;  Location: [facility] ENDOSCOPY;  Service: Gastroenterology;;   PARTIAL HYSTERECTOMY  05/20/1978   ovaries left in   TONSILLECTOMY     TUBAL LIGATION     WISDOM TOOTH EXTRACTION      SOCIAL HISTORY: Social History   Socioeconomic History   Marital status: Married    Spouse name: Not on file   Number of children: 2   Years of education: Not on file   Highest education level: Not on file  Occupational History   Occupation: Retired from [employer] in 2008  Tobacco Use   Smoking status: Never   Smokeless tobacco: Never  Vaping Use   Vaping status: Never  Used  Substance and Sexual Activity   Alcohol use: No   Drug use: No   Sexual activity: Not Currently  Other Topics Concern   Not on file  Social History Narrative   Married 1966   2 kids   4 grand children   Retired from photographer   Social Drivers of Health   Financial Resource Strain: Low Risk  (01/05/2024)   Overall Financial Resource Strain (CARDIA)    Difficulty of Paying Living Expenses: Not hard at all  Food Insecurity: No Food Insecurity (01/05/2024)   Hunger Vital Sign    Worried About Running Out of Food in the Last Year: Never true    Ran Out of Food in the Last Year: Never true  Transportation Needs: No Transportation Needs (01/05/2024)   PRAPARE - Administrator, Civil Service (Medical): No  Lack of Transportation (Non-Medical): No  Physical Activity: Sufficiently Active (01/05/2024)   Exercise Vital Sign    Days of Exercise per Week: 7 days    Minutes of Exercise per Session: 30 min  Stress: No Stress Concern Present (01/05/2024)   [employer] of Occupational Health - Occupational Stress Questionnaire    Feeling of Stress: Not at all  Social Connections: Moderately Isolated (01/05/2024)   Social Connection and Isolation Panel    Frequency of Communication with Friends and Family: More than three times a week    Frequency of Social Gatherings with Friends and Family: More than three times a week    Attends Religious Services: Never    Database Administrator or Organizations: No    Attends Banker Meetings: Never    Marital Status: Married  Catering Manager Violence: Not At Risk (01/05/2024)   Humiliation, Afraid, Rape, and Kick questionnaire    Fear of Current or Ex-Partner: No    Emotionally Abused: No    Physically Abused: No    Sexually Abused: No    FAMILY HISTORY: Family History  Problem Relation Age of Onset   Diabetes Mother    Hyperlipidemia Mother    Hypertension Mother    Breast cancer Mother    Heart attack Father     Heart disease Father    Hypertension Sister    Diabetes Sister    Heart disease Brother        CHF  EF 10%   Hypertension Brother    Gout Brother    Depression Neg Hx    Alcohol abuse Neg Hx    Drug abuse Neg Hx    Colon cancer Neg Hx    Uterine cancer Neg Hx    Ovarian cancer Neg Hx    Prostate cancer Neg Hx     Review of Systems  Constitutional:  Negative for appetite change, chills, fatigue, fever and unexpected weight change.  HENT:   Negative for hearing loss, lump/mass and trouble swallowing.   Eyes:  Negative for eye problems and icterus.  Respiratory:  Negative for chest tightness, cough and shortness of breath.   Cardiovascular:  Negative for chest pain, leg swelling and palpitations.  Gastrointestinal:  Negative for abdominal distention, abdominal pain, constipation, diarrhea, nausea and vomiting.  Endocrine: Negative for hot flashes.  Genitourinary:  Negative for difficulty urinating.   Musculoskeletal:  Negative for arthralgias.  Skin:  Negative for itching and rash.  Neurological:  Negative for dizziness, extremity weakness, headaches and numbness.  Hematological:  Negative for adenopathy. Does not bruise/bleed easily.  Psychiatric/Behavioral:  Negative for depression. The patient is not nervous/anxious.   All other systems reviewed and are negative.     PHYSICAL EXAMINATION   Onc Performance Status - 04/22/24 1300       KPS SCALE   KPS % SCORE Able to carry on normal activity, minor s/s of disease         General appearance: Alert, oriented in no acute distess Bilateral breast examined, left breast with posttreatment changes.  No palpable masses or regional adenopathy Heart: RRR Abdomen: Soft, non tender, non distended, no organomegaly. No LE edema  LABORATORY DATA:  CBC    Component Value Date/Time   WBC 6.0 04/22/2024 1300   RBC 3.30 (L) 04/22/2024 1300   HGB 8.5 (L) 04/22/2024 1300   HGB 8.8 (L) 07/16/2023 1303   HGB 10.0 (L) 08/01/2016  1016   HCT 27.5 (L) 04/22/2024 1300  HCT 30.1 (L) 08/01/2016 1016   PLT 295 04/22/2024 1300   PLT 284 07/16/2023 1303   PLT 239 08/01/2016 1016   MCV 83.3 04/22/2024 1300   MCV 90.2 08/01/2016 1016   MCH 25.8 (L) 04/22/2024 1300   MCHC 30.9 04/22/2024 1300   RDW 14.5 04/22/2024 1300   RDW 13.5 08/01/2016 1016   LYMPHSABS 0.8 04/22/2024 1300   LYMPHSABS 0.9 08/01/2016 1016   MONOABS 0.7 04/22/2024 1300   MONOABS 0.5 08/01/2016 1016   EOSABS 0.2 04/22/2024 1300   EOSABS 0.1 08/01/2016 1016   BASOSABS 0.0 04/22/2024 1300   BASOSABS 0.0 08/01/2016 1016    CMP     Component Value Date/Time   NA 143 01/22/2024 1445   NA 143 08/01/2016 1016   K 4.0 01/22/2024 1445   K 3.8 08/01/2016 1016   CL 102 01/22/2024 1445   CO2 32 01/22/2024 1445   CO2 29 08/01/2016 1016   GLUCOSE 84 01/22/2024 1445   GLUCOSE 77 08/01/2016 1016   BUN 19 01/22/2024 1445   BUN 13.6 08/01/2016 1016   CREATININE 0.78 01/22/2024 1445   CREATININE 0.75 07/16/2023 1303   CREATININE 0.8 08/01/2016 1016   CALCIUM 9.5 01/22/2024 1445   CALCIUM 9.5 08/01/2016 1016   PROT 6.9 01/22/2024 1445   PROT 7.0 08/01/2016 1016   ALBUMIN 4.2 01/22/2024 1445   ALBUMIN 3.6 08/01/2016 1016   AST 16 01/22/2024 1445   AST 15 07/16/2023 1303   AST 18 08/01/2016 1016   ALT 8 01/22/2024 1445   ALT 8 07/16/2023 1303   ALT 13 08/01/2016 1016   ALKPHOS 52 01/22/2024 1445   ALKPHOS 45 08/01/2016 1016   BILITOT 0.8 01/22/2024 1445   BILITOT 0.6 07/16/2023 1303   BILITOT 0.44 08/01/2016 1016   GFRNONAA >60 07/16/2023 1303   GFRAA >60 10/02/2019 0957   GFRAA >60 08/06/2018 1044   ASSESSMENT and THERAPY PLAN:   This is a very pleasant 78 year old female patient with history of left breast cancer status postlumpectomy radiation and antiestrogen therapy, also has iron  deficiency from recurrent GI bleed secondary to GAVE who is here for follow-up. Assessment & Plan Iron  deficiency anemia Hemoglobin decreased to 8.5. Iron   levels pending. Previous iron  infusion well-tolerated.  - We will plan to repeat venofer  supplementation, I anticipate that the iron  levels and ferritin will down trend. - Advised to take over-the-counter iron  supplements with vitamin C or orange juice in the interim. - Schedule follow-up in six months. - Advised to call if not contacted for iron  infusion within a week.  Amber Stalls MD

## 2024-04-22 NOTE — Addendum Note (Signed)
 Addended by: DAYNE SHERRY RAMAN on: 04/22/2024 02:04 PM   Modules accepted: Orders

## 2024-04-23 ENCOUNTER — Other Ambulatory Visit: Payer: Self-pay | Admitting: Hematology and Oncology

## 2024-04-26 ENCOUNTER — Telehealth: Payer: Self-pay | Admitting: Hematology and Oncology

## 2024-04-26 NOTE — Telephone Encounter (Signed)
 I spoke with patient and she is aware of date/time of scheduled appointments for 10/26/2024.

## 2024-04-30 ENCOUNTER — Telehealth: Payer: Self-pay

## 2024-04-30 NOTE — Telephone Encounter (Signed)
 Copied from CRM #[id number]. Topic: Clinical - Medication Question >> Apr 30, 2024 10:14 AM Tinnie BROCKS wrote: Reason for CRM: Sam with [facility] calling to let us  know that dicyclomine  and diphenoxylate -atrpone are not recommended to be taken together due to patient's age and suggest stopping or reducing these medications if not already. Will be faxing us  this information. Best cb# [phone]

## 2024-04-30 NOTE — Telephone Encounter (Signed)
 My understanding was the lomotil  was prn.   How often is she using that now?  Any adverse effect noted by patient with med use?  Please let me know.  Thanks.

## 2024-05-03 NOTE — Telephone Encounter (Signed)
Number busy will call back later time

## 2024-05-04 ENCOUNTER — Ambulatory Visit
Admission: RE | Admit: 2024-05-04 | Discharge: 2024-05-04 | Disposition: A | Source: Ambulatory Visit | Attending: Hematology and Oncology

## 2024-05-04 VITALS — BP 154/81 | HR 72 | Temp 97.9°F | Resp 17

## 2024-05-04 DIAGNOSIS — D508 Other iron deficiency anemias: Secondary | ICD-10-CM

## 2024-05-04 MED ORDER — IRON SUCROSE 300 MG IVPB - SIMPLE MED
300.0000 mg | Freq: Once | Status: AC
  Administered 2024-05-04: 09:00:00 300 mg via INTRAVENOUS
  Filled 2024-05-04 (×2): qty 265

## 2024-05-04 NOTE — Progress Notes (Signed)
 Patient received 200 mg of venofer  instead of the 300 mg of venofer  charted due to pharmacy error.

## 2024-05-05 NOTE — Telephone Encounter (Signed)
 Noted, will await GI input.  Thanks.

## 2024-05-05 NOTE — Telephone Encounter (Signed)
 Called patient she is taking on average once a week. She states it is helping her symptoms and she has follow up with GI tomorrow.

## 2024-05-06 ENCOUNTER — Ambulatory Visit: Admitting: Gastroenterology

## 2024-05-06 ENCOUNTER — Encounter: Payer: Self-pay | Admitting: Gastroenterology

## 2024-05-06 VITALS — BP 154/86 | HR 72 | Ht 65.0 in | Wt 111.4 lb

## 2024-05-06 DIAGNOSIS — Z860100 Personal history of colon polyps, unspecified: Secondary | ICD-10-CM | POA: Diagnosis not present

## 2024-05-06 DIAGNOSIS — K31819 Angiodysplasia of stomach and duodenum without bleeding: Secondary | ICD-10-CM

## 2024-05-06 DIAGNOSIS — K589 Irritable bowel syndrome without diarrhea: Secondary | ICD-10-CM | POA: Diagnosis not present

## 2024-05-06 DIAGNOSIS — Z9049 Acquired absence of other specified parts of digestive tract: Secondary | ICD-10-CM

## 2024-05-06 DIAGNOSIS — D500 Iron deficiency anemia secondary to blood loss (chronic): Secondary | ICD-10-CM | POA: Diagnosis not present

## 2024-05-06 DIAGNOSIS — R197 Diarrhea, unspecified: Secondary | ICD-10-CM | POA: Diagnosis not present

## 2024-05-06 MED ORDER — OMEPRAZOLE 40 MG PO CPDR: 40.0000 mg | DELAYED_RELEASE_CAPSULE | Freq: Every day | ORAL | 3 refills | Status: DC

## 2024-05-06 MED ORDER — DIPHENOXYLATE-ATROPINE 2.5-0.025 MG PO TABS: 1.0000 | ORAL_TABLET | Freq: Four times a day (QID) | ORAL | 5 refills | Status: AC | PRN

## 2024-05-06 NOTE — Patient Instructions (Signed)
 You have been scheduled for an endoscopy. Please follow written instructions given to you at your visit today.  If you use inhalers (even only as needed), please bring them with you on the day of your procedure.  If you take any of the following medications, they will need to be adjusted prior to your procedure:   DO NOT TAKE 7 DAYS PRIOR TO TEST- Trulicity (dulaglutide) Ozempic, Wegovy (semaglutide) Mounjaro, Zepbound (tirzepatide) Bydureon Bcise (exanatide extended release)  DO NOT TAKE 1 DAY PRIOR TO YOUR TEST Rybelsus (semaglutide) Adlyxin (lixisenatide) Victoza (liraglutide) Byetta (exanatide) ___________________________________________________________________________  Due to recent changes in healthcare laws, you may see the results of your imaging and laboratory studies on MyChart before your provider has had a chance to review them.  We understand that in some cases there may be results that are confusing or concerning to you. Not all laboratory results come back in the same time frame and the provider may be waiting for multiple results in order to interpret others.  Please give us  48 hours in order for your provider to thoroughly review all the results before contacting the office for clarification of your results.   _______________________________________________________  If your blood pressure at your visit was 140/90 or greater, please contact your primary care physician to follow up on this.  _______________________________________________________  If you are age 81 or older, your body mass index should be between 23-30. Your Body mass index is 18.53 kg/m. If this is out of the aforementioned range listed, please consider follow up with your Primary Care Provider.  If you are age 17 or younger, your body mass index should be between 19-25. Your Body mass index is 18.53 kg/m. If this is out of the aformentioned range listed, please consider follow up with your Primary Care  Provider.   ________________________________________________________  The [facility] providers would like to encourage you to use MYCHART to communicate with providers for non-urgent requests or questions.  Due to long hold times on the telephone, sending your provider a message by [facility] may be a faster and more efficient way to get a response.  Please allow 48 business hours for a response.  Please remember that this is for non-urgent requests.  _______________________________________________________  [facility] is using a team-based approach to care.  Your team is made up of your doctor and two to three APPS. Our APPS (Nurse Practitioners and Physician Assistants) work with your physician to ensure care continuity for you. They are fully qualified to address your health concerns and develop a treatment plan. They communicate directly with your gastroenterologist to care for you. Seeing the Advanced Practice Practitioners on your physician's team can help you by facilitating care more promptly, often allowing for earlier appointments, access to diagnostic testing, procedures, and other specialty referrals.   Thank you for trusting me with your gastrointestinal care. Deanna May, FNP-C

## 2024-05-06 NOTE — Progress Notes (Signed)
 Chief Complaint:follow-up Primary GI Doctor:Dr. Federico  HPI: 78 year old female with history of GAVE, breast cancer s/p lumpectomy and radiation, osteoporosis, prior C dif infection presents for follow up of anemia and diarrhea  Interval History Patient last seen in GI office on 06/10/23 by Dr. Federico.   Patient presents for follow-up of anemia and diarrhea.  Patient reports her episodes of abdominal cramping and diarrhea have been well-controlled with Bentyl  and over-the-counter Ibgard.  Patient only uses Lomotil  if she goes out in public to eat.  Patient has noted intermittent dark tarry stools last few weeks.  She is scheduled for IV iron  infusions with that hematologist.  Patient was previously getting GAVE treatment at [facility] however has relocated to our office and would like to be scheduled for treatment with Dr. Federico.   Wt Readings from Last 3 Encounters:  05/06/24 111 lb 6 oz (50.5 kg)  04/22/24 112 lb 9.6 oz (51.1 kg)  01/22/24 117 lb (53.1 kg)      Past Medical History:  Diagnosis Date   Anemia    Breast cancer of upper-outer quadrant of left female breast (HCC)    C. difficile diarrhea    Cataract    Colon polyps    Gallstones 08/23/2003   x 2   Gastric hemorrhage due to gastric antral vascular ectasia (GAVE)    s/p radiofrequency ablation    GAVE (gastric antral vascular ectasia)    Hot flashes    Hypertension    Kidney stones    Radiation 10/14/13-12/02/13   left breast 50.4 gray, lumpectomy cavity boosted to 63 gray   Wears glasses     Past Surgical History:  Procedure Laterality Date   BREAST LUMPECTOMY WITH SENTINEL LYMPH NODE BIOPSY Left 09/06/2013   LEFT BREAST SEED GUIDED LUMPECTOMY WITH LEFT AXILLARY SENTINEL NODE BIOPSY (Left)   CATARACT EXTRACTION, BILATERAL Bilateral    right-07/31/22, left-08/14/22   CHOLECYSTECTOMY     COLONOSCOPY     ESOPHAGOGASTRODUODENOSCOPY N/A 09/01/2023   Procedure: EGD (ESOPHAGOGASTRODUODENOSCOPY);  Surgeon:  Federico Rosario BROCKS, MD;  Location: [facility] ENDOSCOPY;  Service: Gastroenterology;  Laterality: N/A;  EGD with RFA for GAVE   GI RADIOFREQUENCY ABLATION  09/01/2023   Procedure: RADIOFREQUENCY ABLATION, UPPER GI TRACT, ENDOSCOPIC;  Surgeon: Federico Rosario BROCKS, MD;  Location: [facility] ENDOSCOPY;  Service: Gastroenterology;;   PARTIAL HYSTERECTOMY  05/20/1978   ovaries left in   TONSILLECTOMY     TUBAL LIGATION     WISDOM TOOTH EXTRACTION      Current Outpatient Medications  Medication Sig Dispense Refill   amLODipine  (NORVASC ) 2.5 MG tablet TAKE 1-2 TABLETS (2.5-5 MG TOTAL) BY MOUTH DAILY. 180 tablet 1   Cholecalciferol (VITAMIN D ) 50 MCG (2000 UT) CAPS Take 2,000 Units by mouth daily.     cyanocobalamin  (VITAMIN B12) 1000 MCG tablet Take 1,000 mcg by mouth 2 (two) times daily.     metoprolol  succinate (TOPROL -XL) 50 MG 24 hr tablet Take 1 tablet (50 mg total) by mouth daily. TAKE WITH OR IMMEDIATELY FOLLOWING A MEAL. 90 tablet 3   dicyclomine  (BENTYL ) 10 MG capsule Take 1 capsule (10 mg total) by mouth daily. 90 capsule 3   diphenoxylate -atropine  (LOMOTIL ) 2.5-0.025 MG tablet Take 1 tablet by mouth 4 (four) times daily as needed for diarrhea or loose stools. 30 tablet 5   omeprazole  (PRILOSEC) 40 MG capsule Take 1 capsule (40 mg total) by mouth daily. 90 capsule 3   No current facility-administered medications for this visit.  Allergies as of 05/06/2024 - Review Complete 05/06/2024  Allergen Reaction Noted   Clindamycin/lincomycin Other (See Comments) 03/10/2017    Family History  Problem Relation Age of Onset   Diabetes Mother    Hyperlipidemia Mother    Hypertension Mother    Breast cancer Mother    Heart attack Father    Heart disease Father    Hypertension Sister    Diabetes Sister    Heart disease Brother        CHF  EF 10%   Hypertension Brother    Gout Brother    Depression Neg Hx    Alcohol abuse Neg Hx    Drug abuse Neg Hx    Colon cancer Neg Hx    Uterine cancer Neg Hx     Ovarian cancer Neg Hx    Prostate cancer Neg Hx     Review of Systems:    Constitutional: No weight loss, fever, chills, weakness or fatigue HEENT: Eyes: No change in vision               Ears, Nose, Throat:  No change in hearing or congestion Skin: No rash or itching Cardiovascular: No chest pain, chest pressure or palpitations   Respiratory: No SOB or cough Gastrointestinal: See HPI and otherwise negative Genitourinary: No dysuria or change in urinary frequency Neurological: No headache, dizziness or syncope Musculoskeletal: No new muscle or joint pain Hematologic: No bleeding or bruising Psychiatric: No history of depression or anxiety    Physical Exam:  Vital signs: BP (!) 154/86 (BP Location: Right Arm, Patient Position: Sitting, Cuff Size: Normal)   Pulse 72   Ht 5' 5 (1.651 m)   Wt 111 lb 6 oz (50.5 kg)   BMI 18.53 kg/m   Constitutional:   Pleasant  female appears to be in NAD, Well developed, Well nourished, alert and cooperative Eyes:   PEERL, EOMI. No icterus. Conjunctiva pink. Neck:  Supple Throat: Oral cavity and pharynx without inflammation, swelling or lesion.  Respiratory: Respirations even and unlabored. Lungs clear to auscultation bilaterally.   No wheezes, crackles, or rhonchi.  Cardiovascular: Normal S1, S2. Regular rate and rhythm. No peripheral edema, cyanosis or pallor.  Gastrointestinal:  Soft, nondistended, nontender. No rebound or guarding. Normal bowel sounds. No appreciable masses or hepatomegaly. Rectal:  Not performed.  Msk:  Symmetrical without gross deformities. Without edema, no deformity or joint abnormality.  Neurologic:  Alert and  oriented x4;  grossly normal neurologically.  Skin:   Dry and intact without significant lesions or rashes.  RELEVANT LABS AND IMAGING: CBC    Latest Ref Rng & Units 04/22/2024    1:00 PM 01/22/2024    2:45 PM 10/16/2023    1:42 PM  CBC  WBC 4.0 - 10.5 K/uL 6.0  5.8  5.1   Hemoglobin 12.0 - 15.0 g/dL 8.5   89.9  89.2   Hematocrit 36.0 - 46.0 % 27.5  30.5  34.7   Platelets 150 - 400 K/uL 295  289.0  244      CMP     Latest Ref Rng & Units 01/22/2024    2:45 PM 07/16/2023    1:03 PM 05/22/2023   12:48 PM  CMP  Glucose 70 - 99 mg/dL 84  91  91   BUN 6 - 23 mg/dL 19  18  16    Creatinine 0.40 - 1.20 mg/dL 9.21  9.24  9.31   Sodium 135 - 145 mEq/L 143  140  142  Potassium 3.5 - 5.1 mEq/L 4.0  3.7  4.1   Chloride 96 - 112 mEq/L 102  105  102   CO2 19 - 32 mEq/L 32  30  32   Calcium 8.4 - 10.5 mg/dL 9.5  9.2  9.4   Total Protein 6.0 - 8.3 g/dL 6.9  6.8  6.8   Total Bilirubin 0.2 - 1.2 mg/dL 0.8  0.6  0.7   Alkaline Phos 39 - 117 U/L 52  64  59   AST 0 - 37 U/L 16  15  16    ALT 0 - 35 U/L 8  8  9       Lab Results  Component Value Date   TSH 1.45 02/27/2011  Labs 07/2021: CBC with low Hb of 11. BMP nml. FOBT positive.   Labs 08/2021: CBC with low Hb of 10.4. Ferritin 201.   Labs 10/2021: CBC with low Hb of 10.4.   Labs 05/2022: CBC with low Hb of 9.9. Ferritin 126. CMP unremarkable.   Labs 10/2022: CBC with low Hb of 9. Ferritin nml at 102. Iron  sat elevated.    Labs 05/2023: CBC with low Hb of 10.5. CMP nml. Lipase nml.    TTE 07/23/17: Study Conclusions  - Left ventricle: The cavity size was normal. There was mild focal    basal hypertrophy of the septum. Systolic function was normal.    The estimated ejection fraction was in the range of 60% to 65%.    Wall motion was normal; there were no regional wall motion    abnormalities. Doppler parameters are consistent with abnormal    left ventricular relaxation (grade 1 diastolic dysfunction).  - Mitral valve: There was mild regurgitation.  - Left atrium: The atrium was normal in size.  - Right ventricle: Systolic function was normal.  - Pulmonary arteries: Systolic pressure was within the normal    range.    CTA GI Bleed 07/18/21: IMPRESSION: VASCULAR 1. Visceral arteries in the abdomen and pelvis are widely patent. No evidence for  acute or chronic mesenteric ischemia. 2. 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 Recommend follow-up CTA in 6-12 months to ensure stability. 3.  Aortic Atherosclerosis (ICD10-I70.0). NON-VASCULAR 1. No evidence for active GI bleeding. 2. Extensive colonic diverticulosis without acute inflammation. 3. Perinephric edema or stranding along the posteroinferior aspect of both kidneys. Findings are nonspecific and age-indeterminate. Probable cyst in the right kidney upper pole. 4. Cholecystectomy. Mild intrahepatic biliary dilatation versus mild periportal edema. 5. Dependent changes in both lung bases, likely related atelectasis. 6. Indeterminate 4 mm nodule in the right middle lobe. No follow-up needed if patient is low-risk. Non-contrast chest CT can be considered in 12 months if patient is high-risk. This recommendation follows the consensus statement: Guidelines for Management of Incidental Pulmonary Nodules Detected on CT Images: From the Fleischner Society 2017; Radiology 2017; 284:228-243.   CTA Pelvis 03/15/22: IMPRESSION: Unchanged appearance of the 6 mm outpouching from the right internal iliac artery. Given that there is an artery origin from the dome, this might represent either an infundibulum or aneurysm Aortic Atherosclerosis (ICD10-I70.0). Additional ancillary findings as above.   Colonoscopy 01/27/14: Left sided diverticulosis. Internal hemorrhoids. Recommend repeat in 5 years.   Colonoscopy 04/05/19: Diverticulosis of the sigmoid colon. Internal hemorrhoids. Recommended repeat colonoscopy in 5 years due to personal history of adenoma   EGD 08/08/20: Esophagus and duodenum were normal. Linear gastric antral vascular ectasia in the antrum that  was treated with RFA.    EGD 06/19/22: Impression  Linear gastric antral vascular ectasia in the antrum; induced coagulation  and hemostasis achieved with radiofrequency ablation   The esophagus and duodenum appeared normal.   09/01/23 EGD - Normal esophagus. - Gastric antral vascular ectasia. Treated with radiofrequency ablation. - Normal examined duodenum. - No specimens collected.  Assessment: Encounter Diagnoses  Name Primary?   GAVE (gastric antral vascular ectasia) Yes   Iron  deficiency anemia due to chronic blood loss    Irritable bowel syndrome, unspecified type    History of cholecystectomy    History of colonic polyps    Diarrhea, unspecified type     78 year old female patient with history of IBS that has responded well to Bentyl  and over-the-counter IBgard as needed.  Patient has Lomotil  as needed for traveling or going out. She is due for a colonoscopy for polyp surveillance, but she is hesitant to schedule this at this time.     Patient also has history of IDA secondary to GAVE.  Hgb dropped 10 to 8.5. Currently receiving IV iron  infusions with hem/onc. Will go ahead and schedule her for EGD in hospital with Dr. Federico for retreatment of GAVE  Plan: -Cont Bentyl  PRN for ab pain. Refill. - Cont Lomotil  PRN for diarrhea. Refill. -Continue Ibgard OTC 2 capsules with meals -Schedule EGD in hospital with Dr. Federico. The risks and benefits of EGD with possible biopsies and esophageal dilation were discussed with the patient who agrees to proceed. - Patient declined to schedule colonoscopy today   Thank you for the courtesy of this consult. Please call me with any questions or concerns.   Ercelle Winkles, FNP-C [facility] 05/06/2024, 12:41 PM  Cc: [name], MD

## 2024-05-11 ENCOUNTER — Ambulatory Visit
Admission: RE | Admit: 2024-05-11 | Discharge: 2024-05-11 | Disposition: A | Discharge status: Home / Self Care | Source: Ambulatory Visit | Attending: Hematology and Oncology | Admitting: Hematology and Oncology

## 2024-05-11 VITALS — BP 175/73 | HR 66 | Temp 97.7°F | Resp 14 | Ht 65.0 in | Wt 111.3 lb

## 2024-05-11 DIAGNOSIS — D508 Other iron deficiency anemias: Secondary | ICD-10-CM

## 2024-05-11 MED ORDER — IRON SUCROSE 300 MG IVPB - SIMPLE MED
300.0000 mg | Freq: Once | Status: AC
  Administered 2024-05-11: 300 mg via INTRAVENOUS
  Filled 2024-05-11: qty 265

## 2024-05-18 ENCOUNTER — Ambulatory Visit
Admission: RE | Admit: 2024-05-18 | Discharge: 2024-05-18 | Disposition: A | Discharge status: Home / Self Care | Source: Ambulatory Visit | Attending: Hematology and Oncology | Admitting: Hematology and Oncology

## 2024-05-18 VITALS — BP 164/78 | HR 62 | Temp 97.8°F | Resp 16 | Ht 65.0 in | Wt 111.0 lb

## 2024-05-18 DIAGNOSIS — D508 Other iron deficiency anemias: Secondary | ICD-10-CM | POA: Diagnosis not present

## 2024-05-18 MED ORDER — IRON SUCROSE 300 MG IVPB - SIMPLE MED
300.0000 mg | Freq: Once | Status: AC
  Administered 2024-05-18: 300 mg via INTRAVENOUS
  Filled 2024-05-18: qty 300

## 2024-05-21 ENCOUNTER — Other Ambulatory Visit: Payer: Self-pay

## 2024-05-21 ENCOUNTER — Encounter (HOSPITAL_COMMUNITY): Payer: Self-pay | Admitting: Internal Medicine

## 2024-05-29 ENCOUNTER — Other Ambulatory Visit: Payer: Self-pay | Admitting: Family Medicine

## 2024-05-29 DIAGNOSIS — I10 Essential (primary) hypertension: Secondary | ICD-10-CM

## 2024-06-01 ENCOUNTER — Telehealth: Payer: Self-pay

## 2024-06-01 NOTE — Telephone Encounter (Signed)
 Procedure:EGD Procedure date: 1/1//2026 Procedure location: [facility] Arrival Time: 6:00 Spoke with the patient Y/N: Y Any prep concerns? N Has the patient obtained the prep from the pharmacy ? Y Do you have a care partner and transportation: Y Any additional concerns? N

## 2024-06-07 ENCOUNTER — Encounter (HOSPITAL_COMMUNITY)
Admission: RE | Disposition: A | Discharge status: Home / Self Care | Payer: Self-pay | Source: Home / Self Care | Attending: Internal Medicine

## 2024-06-07 ENCOUNTER — Encounter (HOSPITAL_COMMUNITY): Payer: Self-pay | Admitting: Internal Medicine

## 2024-06-07 ENCOUNTER — Ambulatory Visit (HOSPITAL_COMMUNITY): Admitting: Anesthesiology

## 2024-06-07 ENCOUNTER — Ambulatory Visit (HOSPITAL_COMMUNITY)
Admission: RE | Admit: 2024-06-07 | Discharge: 2024-06-07 | Disposition: A | Discharge status: Home / Self Care | Attending: Internal Medicine | Admitting: Internal Medicine

## 2024-06-07 ENCOUNTER — Encounter (HOSPITAL_COMMUNITY): Admitting: Anesthesiology

## 2024-06-07 ENCOUNTER — Other Ambulatory Visit: Payer: Self-pay

## 2024-06-07 DIAGNOSIS — Z79899 Other long term (current) drug therapy: Secondary | ICD-10-CM | POA: Insufficient documentation

## 2024-06-07 DIAGNOSIS — K921 Melena: Secondary | ICD-10-CM | POA: Diagnosis not present

## 2024-06-07 DIAGNOSIS — K31819 Angiodysplasia of stomach and duodenum without bleeding: Secondary | ICD-10-CM | POA: Diagnosis not present

## 2024-06-07 DIAGNOSIS — Z860100 Personal history of colon polyps, unspecified: Secondary | ICD-10-CM

## 2024-06-07 DIAGNOSIS — K589 Irritable bowel syndrome without diarrhea: Secondary | ICD-10-CM

## 2024-06-07 DIAGNOSIS — I10 Essential (primary) hypertension: Secondary | ICD-10-CM

## 2024-06-07 DIAGNOSIS — D509 Iron deficiency anemia, unspecified: Secondary | ICD-10-CM | POA: Insufficient documentation

## 2024-06-07 DIAGNOSIS — D500 Iron deficiency anemia secondary to blood loss (chronic): Secondary | ICD-10-CM

## 2024-06-07 DIAGNOSIS — R197 Diarrhea, unspecified: Secondary | ICD-10-CM

## 2024-06-07 DIAGNOSIS — Z9049 Acquired absence of other specified parts of digestive tract: Secondary | ICD-10-CM

## 2024-06-07 HISTORY — PX: ESOPHAGOGASTRODUODENOSCOPY: SHX5428

## 2024-06-07 HISTORY — PX: GI RADIOFREQUENCY ABLATION: SHX6807

## 2024-06-07 MED ORDER — LIDOCAINE 2% (20 MG/ML) 5 ML SYRINGE
INTRAMUSCULAR | Status: DC | PRN
  Administered 2024-06-07: 75 mg via INTRAVENOUS

## 2024-06-07 MED ORDER — PROPOFOL 10 MG/ML IV BOLUS
INTRAVENOUS | Status: AC
  Filled 2024-06-07: qty 20

## 2024-06-07 MED ORDER — SODIUM CHLORIDE 0.9 % IV SOLN
INTRAVENOUS | Status: DC
  Administered 2024-06-07: 500 mL via INTRAVENOUS

## 2024-06-07 MED ORDER — PROPOFOL 1000 MG/100ML IV EMUL
INTRAVENOUS | Status: AC
  Filled 2024-06-07: qty 200

## 2024-06-07 MED ORDER — PROPOFOL 500 MG/50ML IV EMUL
INTRAVENOUS | Status: AC
  Filled 2024-06-07: qty 50

## 2024-06-07 MED ORDER — PROPOFOL 10 MG/ML IV BOLUS
INTRAVENOUS | Status: DC | PRN
  Administered 2024-06-07: 50 mg via INTRAVENOUS
  Administered 2024-06-07: 150 ug/kg/min via INTRAVENOUS

## 2024-06-07 MED ORDER — OMEPRAZOLE 40 MG PO CPDR: 40.0000 mg | DELAYED_RELEASE_CAPSULE | Freq: Two times a day (BID) | ORAL | 3 refills | Status: AC

## 2024-06-07 MED ORDER — PROPOFOL 500 MG/50ML IV EMUL
INTRAVENOUS | Status: AC
  Filled 2024-06-07: qty 200

## 2024-06-07 MED ORDER — PROPOFOL 1000 MG/100ML IV EMUL
INTRAVENOUS | Status: AC
  Filled 2024-06-07: qty 100

## 2024-06-07 NOTE — Transfer of Care (Signed)
 Immediate Anesthesia Transfer of Care Note  Patient: Kristi Carr  Procedure(s) Performed: EGD (ESOPHAGOGASTRODUODENOSCOPY) RADIOFREQUENCY ABLATION, UPPER GI TRACT, ENDOSCOPIC  Patient Location: Endoscopy Unit  Anesthesia Type:MAC  Level of Consciousness: oriented, sedated, drowsy, and patient cooperative  Airway & Oxygen Therapy: Patient Spontanous Breathing and Patient connected to face mask oxygen  Post-op Assessment: Report given to RN and Post -op Vital signs reviewed and stable  Post vital signs: Reviewed  Last Vitals:  Vitals Value Taken Time  BP    Temp    Pulse 62 06/07/24 08:26  Resp 15 06/07/24 08:26  SpO2 100 % 06/07/24 08:26  Vitals shown include unfiled device data.  Last Pain:  Vitals:   06/07/24 0825  TempSrc:   PainSc: 0-No pain         Complications: No notable events documented.

## 2024-06-07 NOTE — H&P (Addendum)
 "   GASTROENTEROLOGY PROCEDURE H&P NOTE   Primary Care Physician: [name], MD    Reason for Procedure:   GAVE, melena, IDA  Plan:    EGD  Patient is appropriate for endoscopic procedure(s) in the ambulatory (hospital) setting.  The nature of the procedure, as well as the risks, benefits, and alternatives were carefully and thoroughly reviewed with the patient. Ample time for discussion and questions allowed. The patient understood, was satisfied, and agreed to proceed.     HPI: Kristi Carr is a 79 y.o. female who presents for EGD for evaluation of GAVE, melena, IDA.  Patient was most recently seen in the Gastroenterology Clinic on 05/06/24.  No interval change in medical history since that appointment. Please refer to that note for full details regarding GI history and clinical presentation.   Past Medical History:  Diagnosis Date   Anemia    Breast cancer of upper-outer quadrant of left female breast (HCC)    C. difficile diarrhea    Cataract    Colon polyps    Gallstones 08/23/2003   x 2   Gastric hemorrhage due to gastric antral vascular ectasia (GAVE)    s/p radiofrequency ablation    GAVE (gastric antral vascular ectasia)    Hot flashes    Hypertension    Kidney stones    Radiation 10/14/13-12/02/13   left breast 50.4 gray, lumpectomy cavity boosted to 63 gray   Wears glasses     Past Surgical History:  Procedure Laterality Date   BREAST LUMPECTOMY WITH SENTINEL LYMPH NODE BIOPSY Left 09/06/2013   LEFT BREAST SEED GUIDED LUMPECTOMY WITH LEFT AXILLARY SENTINEL NODE BIOPSY (Left)   CATARACT EXTRACTION, BILATERAL Bilateral    right-07/31/22, left-08/14/22   CHOLECYSTECTOMY     COLONOSCOPY     ESOPHAGOGASTRODUODENOSCOPY N/A 09/01/2023   Procedure: EGD (ESOPHAGOGASTRODUODENOSCOPY);  Surgeon: Federico Rosario BROCKS, MD;  Location: [facility] ENDOSCOPY;  Service: Gastroenterology;  Laterality: N/A;  EGD with RFA for GAVE   GI RADIOFREQUENCY ABLATION  09/01/2023   Procedure:  RADIOFREQUENCY ABLATION, UPPER GI TRACT, ENDOSCOPIC;  Surgeon: Federico Rosario BROCKS, MD;  Location: [facility] ENDOSCOPY;  Service: Gastroenterology;;   PARTIAL HYSTERECTOMY  05/20/1978   ovaries left in   TONSILLECTOMY     TUBAL LIGATION     WISDOM TOOTH EXTRACTION      Prior to Admission medications  Medication Sig Start Date End Date Taking? Authorizing Provider  amLODipine  (NORVASC ) 2.5 MG tablet TAKE 1-2 TABLETS (2.5-5 MG TOTAL) BY MOUTH DAILY. 05/31/24  Yes [name], MD  Cholecalciferol (VITAMIN D ) 50 MCG (2000 UT) CAPS Take 2,000 Units by mouth daily.   Yes [provider]  cyanocobalamin  (VITAMIN B12) 1000 MCG tablet Take 1,000 mcg by mouth 2 (two) times daily.   Yes [provider]  metoprolol  succinate (TOPROL -XL) 50 MG 24 hr tablet Take 1 tablet (50 mg total) by mouth daily. TAKE WITH OR IMMEDIATELY FOLLOWING A MEAL. 01/22/24  Yes [name], MD  omeprazole  (PRILOSEC) 40 MG capsule Take 1 capsule (40 mg total) by mouth daily. 05/06/24  Yes May, Deanna J, NP  dicyclomine  (BENTYL ) 10 MG capsule Take 1 capsule (10 mg total) by mouth daily. Patient taking differently: Take 10 mg by mouth daily as needed for spasms. 05/06/24   May, Deanna J, NP  diphenoxylate -atropine  (LOMOTIL ) 2.5-0.025 MG tablet Take 1 tablet by mouth 4 (four) times daily as needed for diarrhea or loose stools. 05/06/24   May, Deanna J, NP  Current Facility-Administered Medications  Medication Dose Route Frequency Provider Last Rate Last Admin   0.9 %  sodium chloride  infusion   Intravenous Continuous May, Deanna J, NP 20 mL/hr at 06/07/24 0708 Continued from Pre-op at 06/07/24 0708    Allergies as of 05/06/2024 - Review Complete 05/06/2024  Allergen Reaction Noted   Clindamycin/lincomycin Other (See Comments) 03/10/2017    Family History  Problem Relation Age of Onset   Diabetes Mother    Hyperlipidemia Mother    Hypertension Mother    Breast cancer Mother    Heart attack Father    Heart  disease Father    Hypertension Sister    Diabetes Sister    Heart disease Brother        CHF  EF 10%   Hypertension Brother    Gout Brother    Depression Neg Hx    Alcohol abuse Neg Hx    Drug abuse Neg Hx    Colon cancer Neg Hx    Uterine cancer Neg Hx    Ovarian cancer Neg Hx    Prostate cancer Neg Hx     Social History   Socioeconomic History   Marital status: Married    Spouse name: Not on file   Number of children: 2   Years of education: Not on file   Highest education level: Not on file  Occupational History   Occupation: Retired from [employer] in 2008  Tobacco Use   Smoking status: Never   Smokeless tobacco: Never  Vaping Use   Vaping status: Never Used  Substance and Sexual Activity   Alcohol use: No   Drug use: No   Sexual activity: Not Currently  Other Topics Concern   Not on file  Social History Narrative   Married 1966   2 kids   4 grand children   Retired from photographer   Social Drivers of Health   Tobacco Use: Low Risk (06/07/2024)   Patient History    Smoking Tobacco Use: Never    Smokeless Tobacco Use: Never    Passive Exposure: Not on file  Financial Resource Strain: Low Risk (01/05/2024)   Overall Financial Resource Strain (CARDIA)    Difficulty of Paying Living Expenses: Not hard at all  Food Insecurity: No Food Insecurity (01/05/2024)   Epic    Worried About Radiation Protection Practitioner of Food in the Last Year: Never true    Ran Out of Food in the Last Year: Never true  Transportation Needs: No Transportation Needs (01/05/2024)   Epic    Lack of Transportation (Medical): No    Lack of Transportation (Non-Medical): No  Physical Activity: Sufficiently Active (01/05/2024)   Exercise Vital Sign    Days of Exercise per Week: 7 days    Minutes of Exercise per Session: 30 min  Stress: No Stress Concern Present (01/05/2024)   [employer] of Occupational Health - Occupational Stress Questionnaire    Feeling of Stress: Not at all  Social Connections:  Moderately Isolated (01/05/2024)   Social Connection and Isolation Panel    Frequency of Communication with Friends and Family: More than three times a week    Frequency of Social Gatherings with Friends and Family: More than three times a week    Attends Religious Services: Never    Database Administrator or Organizations: No    Attends Banker Meetings: Never    Marital Status: Married  Catering Manager Violence: Not At Risk (01/05/2024)   Epic  Fear of Current or Ex-Partner: No    Emotionally Abused: No    Physically Abused: No    Sexually Abused: No  Depression (PHQ2-9): Low Risk (01/22/2024)   Depression (PHQ2-9)    PHQ-2 Score: 0  Alcohol Screen: Low Risk (01/05/2024)   Alcohol Screen    Last Alcohol Screening Score (AUDIT): 0  Housing: Unknown (01/05/2024)   Epic    Unable to Pay for Housing in the Last Year: No    Number of Times Moved in the Last Year: Not on file    Homeless in the Last Year: No  Utilities: Not At Risk (01/05/2024)   Epic    Threatened with loss of utilities: No  Health Literacy: Adequate Health Literacy (01/05/2024)   B1300 Health Literacy    Frequency of need for help with medical instructions: Never    Physical Exam: Vital signs in last 24 hours: BP (!) 199/77   Pulse 66   Temp 98.6 F (37 C) (Temporal)   Resp 20   Ht 5' 5 (1.651 m)   Wt 50.3 kg   SpO2 98%   BMI 18.45 kg/m  GEN: NAD EYE: Sclerae anicteric ENT: MMM CV: Non-tachycardic Pulm: No increased WOB GI: Soft NEURO:  Alert & Oriented   Estefana Kidney, MD [facility]   06/07/2024 7:24 AM  "

## 2024-06-07 NOTE — Anesthesia Preprocedure Evaluation (Addendum)
 "                                  Anesthesia Evaluation  Patient identified by MRN, date of birth, ID band Patient awake    Reviewed: Allergy & Precautions, NPO status , Patient's Chart, lab work & pertinent test results  Airway Mallampati: II  TM Distance: >3 FB Neck ROM: Full    Dental no notable dental hx. (+) Teeth Intact, Dental Advisory Given   Pulmonary neg pulmonary ROS   Pulmonary exam normal        Cardiovascular hypertension, Pt. on medications and Pt. on home beta blockers  Rhythm:Regular Rate:Normal     Neuro/Psych negative neurological ROS  negative psych ROS   GI/Hepatic Neg liver ROS,,,  Endo/Other  negative endocrine ROS    Renal/GU Renal disease  negative genitourinary   Musculoskeletal  (+) Arthritis , Osteoarthritis,    Abdominal Normal abdominal exam  (+)   Peds  Hematology  (+) Blood dyscrasia, anemia Lab Results      Component                Value               Date                      WBC                      6.0                 04/22/2024                HGB                      8.5 (L)             04/22/2024                HCT                      27.5 (L)            04/22/2024                MCV                      83.3                04/22/2024                PLT                      295                 04/22/2024              Anesthesia Other Findings   Reproductive/Obstetrics                              Anesthesia Physical Anesthesia Plan  ASA: 2  Anesthesia Plan: MAC   Post-op Pain Management:    Induction: Intravenous  PONV Risk Score and Plan: 2 and Propofol  infusion and Treatment may vary due to age or medical condition  Airway Management Planned: Simple Face Mask and Nasal Cannula  Additional Equipment: None  Intra-op Plan:   Post-operative Plan:   Informed Consent: I have reviewed the patients History and Physical, chart, labs and discussed the procedure including the  risks, benefits and alternatives for the proposed anesthesia with the patient or authorized representative who has indicated his/her understanding and acceptance.     Dental advisory given  Plan Discussed with: CRNA  Anesthesia Plan Comments:          Anesthesia Quick Evaluation  "

## 2024-06-07 NOTE — Discharge Instructions (Signed)

## 2024-06-07 NOTE — Op Note (Signed)
 [facility] Patient Name: Kristi Carr Procedure Date: 06/07/2024 MRN: [id number] Attending MD: Rosario Estefana Kidney , , [id number] Date of Birth: 08/22/1945 CSN: [id number] Age: 79 Admit Type: Outpatient Procedure:                Upper GI endoscopy Indications:              Iron  deficiency anemia, Melena, Watermelon stomach                            (GAVE syndrome) Providers:                Rosario Estefana Kidney, Gregoria Pierce, RN,                            Coye Bade, Technician Referring MD:             Arlyss RAMAN. Cleatus, MD Medicines:                Monitored Anesthesia Care Complications:            No immediate complications. Estimated Blood Loss:     Estimated blood loss was minimal. Procedure:                Pre-Anesthesia Assessment:                           - Prior to the procedure, a History and Physical                            was performed, and patient medications and                            allergies were reviewed. The patient's tolerance of                            previous anesthesia was also reviewed. The risks                            and benefits of the procedure and the sedation                            options and risks were discussed with the patient.                            All questions were answered, and informed consent                            was obtained. Prior Anticoagulants: The patient has                            taken no anticoagulant or antiplatelet agents. ASA                            Grade Assessment: III - A patient with severe  systemic disease. After reviewing the risks and                            benefits, the patient was deemed in satisfactory                            condition to undergo the procedure.                           After obtaining informed consent, the endoscope was                            passed under direct vision. Throughout the                             procedure, the patient's blood pressure, pulse, and                            oxygen saturations were monitored continuously. The                            [id number]-H190 ([id number]) Olympus endoscope was introduced                            through the mouth, and advanced to the second part                            of duodenum. The upper GI endoscopy was                            accomplished without difficulty. The patient                            tolerated the procedure well. Scope In: Scope Out: Findings:      The examined esophagus was normal.      Gastric antral vascular ectasia was present in the gastric antrum. Focal       radiofrequency ablation of gastric antral vascular ectasia in the       gastric antrum was performed. With the endoscope in place, the position       and extent of the abnormal mucosa and appropriate anatomic landmarks       were noted. The radiofrequency channel ablation catheter was introduced       through the endoscope working channel. The endoscope with the ablation       catheter was advanced to the areas of abnormal mucosa. The endoscope       with the channel ablation catheter was positioned under direct       visualization so that the catheter was placed in contact with the       surface of the abnormal mucosa. Energy was applied twice at 12 J/cm2.       Ablation was repeated in a likewise fashion to all visible abnormal       mucosa. The ablation catheter was removed through the endoscope working       channel. The areas where abnormal mucosa had been ablated were examined.  Areas of abnormal mucosa appeared completely ablated.      The examined duodenum was normal. Impression:               - Normal esophagus.                           - Gastric antral vascular ectasia. Treated with                            radiofrequency ablation.                           - Normal examined duodenum.                           - No specimens  collected. Moderate Sedation:      Not Applicable - Patient had care per Anesthesia. Recommendation:           - Discharge patient to home (with escort).                           - Increase omeprazole  to 40 mg twice daily for 8                            weeks, then decrease back down to QD.                           - Repeat RFA PRN for anemia                           - The findings and recommendations were discussed                            with the patient. Procedure Code(s):        --- Professional ---                           [phone], Esophagogastroduodenoscopy, flexible,                            transoral; with control of bleeding, any method Diagnosis Code(s):        --- Professional ---                           K31.819, Angiodysplasia of stomach and duodenum                            without bleeding                           D50.9, Iron  deficiency anemia, unspecified                           K92.1, Melena (includes Hematochezia) CPT copyright 2022 [employer]. All rights reserved. The codes documented in this report are preliminary and upon coder review may  be revised to meet current compliance requirements. Dr Estefana Federico Rosario Estefana  Eulises Kijowski,  06/07/2024 8:26:52 AM Number of Addenda: 0

## 2024-06-08 NOTE — Anesthesia Postprocedure Evaluation (Signed)
"   Anesthesia Post Note  Patient: Kristi Carr  Procedure(s) Performed: EGD (ESOPHAGOGASTRODUODENOSCOPY) RADIOFREQUENCY ABLATION, UPPER GI TRACT, ENDOSCOPIC     Patient location during evaluation: PACU Anesthesia Type: MAC Level of consciousness: awake and alert Pain management: pain level controlled Vital Signs Assessment: post-procedure vital signs reviewed and stable Respiratory status: spontaneous breathing, nonlabored ventilation, respiratory function stable and patient connected to nasal cannula oxygen Cardiovascular status: stable and blood pressure returned to baseline Postop Assessment: no apparent nausea or vomiting Anesthetic complications: no   No notable events documented.  Last Vitals:  Vitals:   06/07/24 0840 06/07/24 0850  BP: (!) 160/49 (!) 191/77  Pulse: 60 70  Resp: 13 16  Temp:    SpO2: 98% 100%    Last Pain:  Vitals:   06/07/24 0850  TempSrc:   PainSc: 0-No pain                 Kristi Carr      "

## 2024-06-09 ENCOUNTER — Encounter (HOSPITAL_COMMUNITY): Payer: Self-pay | Admitting: Internal Medicine

## 2024-10-26 ENCOUNTER — Inpatient Hospital Stay

## 2024-10-26 ENCOUNTER — Inpatient Hospital Stay: Admitting: Hematology and Oncology

## 2025-01-05 ENCOUNTER — Ambulatory Visit
